# Patient Record
Sex: Female | Born: 1975 | Hispanic: No | Smoking: Former smoker
Health system: Southern US, Community
[De-identification: ages and names within clinical notes are randomized; demographics above are authoritative.]

## PROBLEM LIST (undated history)

## (undated) DIAGNOSIS — T7840XA Allergy, unspecified, initial encounter: Secondary | ICD-10-CM

## (undated) DIAGNOSIS — C801 Malignant (primary) neoplasm, unspecified: Secondary | ICD-10-CM

## (undated) DIAGNOSIS — Z9889 Other specified postprocedural states: Secondary | ICD-10-CM

## (undated) DIAGNOSIS — I8393 Asymptomatic varicose veins of bilateral lower extremities: Secondary | ICD-10-CM

## (undated) DIAGNOSIS — T8859XA Other complications of anesthesia, initial encounter: Secondary | ICD-10-CM

## (undated) DIAGNOSIS — K219 Gastro-esophageal reflux disease without esophagitis: Secondary | ICD-10-CM

## (undated) DIAGNOSIS — R112 Nausea with vomiting, unspecified: Secondary | ICD-10-CM

## (undated) HISTORY — DX: Asymptomatic varicose veins of bilateral lower extremities: I83.93

## (undated) HISTORY — DX: Malignant (primary) neoplasm, unspecified: C80.1

## (undated) HISTORY — DX: Allergy, unspecified, initial encounter: T78.40XA

## (undated) HISTORY — DX: Gastro-esophageal reflux disease without esophagitis: K21.9

## (undated) HISTORY — PX: OTHER SURGICAL HISTORY: SHX169

---

## 2001-12-14 DIAGNOSIS — C801 Malignant (primary) neoplasm, unspecified: Secondary | ICD-10-CM

## 2001-12-14 HISTORY — DX: Malignant (primary) neoplasm, unspecified: C80.1

## 2010-12-14 HISTORY — PX: SHOULDER SURGERY: SHX246

## 2010-12-14 HISTORY — PX: TONSILLECTOMY: SUR1361

## 2014-12-14 LAB — HM PAP SMEAR: HM PAP: NORMAL

## 2016-11-23 ENCOUNTER — Ambulatory Visit (INDEPENDENT_AMBULATORY_CARE_PROVIDER_SITE_OTHER): Payer: 59 | Admitting: Medical

## 2016-11-23 ENCOUNTER — Encounter: Payer: Self-pay | Admitting: Medical

## 2016-11-23 ENCOUNTER — Telehealth: Payer: Self-pay

## 2016-11-23 VITALS — BP 90/62 | HR 74 | Temp 98.2°F | Ht 62.75 in | Wt 139.0 lb

## 2016-11-23 DIAGNOSIS — J0110 Acute frontal sinusitis, unspecified: Secondary | ICD-10-CM | POA: Diagnosis not present

## 2016-11-23 DIAGNOSIS — H6690 Otitis media, unspecified, unspecified ear: Secondary | ICD-10-CM | POA: Diagnosis not present

## 2016-11-23 DIAGNOSIS — R059 Cough, unspecified: Secondary | ICD-10-CM

## 2016-11-23 DIAGNOSIS — R05 Cough: Secondary | ICD-10-CM

## 2016-11-23 MED ORDER — BENZONATATE 100 MG PO CAPS: 100.0000 mg | ORAL_CAPSULE | Freq: Three times a day (TID) | ORAL | 0 refills | Status: DC | PRN

## 2016-11-23 MED ORDER — AMOXICILLIN-POT CLAVULANATE 875-125 MG PO TABS: 1.0000 | ORAL_TABLET | Freq: Two times a day (BID) | ORAL | 0 refills | Status: DC

## 2016-11-23 MED ORDER — MOMETASONE FUROATE 50 MCG/ACT NA SUSP: 2.0000 | Freq: Every day | NASAL | 6 refills | Status: DC

## 2016-11-23 MED ORDER — AZELASTINE HCL 0.1 % NA SOLN: 2.0000 | Freq: Two times a day (BID) | NASAL | 3 refills | Status: DC

## 2016-11-23 MED FILL — BENZONATATE 100 MG CAPSULE: 100 | 7 days supply | Qty: 21 | Fill #0

## 2016-11-23 MED FILL — AZELASTINE 0.1% (137 MCG) S: 0.1 | 25 days supply | Qty: 30 | Fill #0

## 2016-11-23 MED FILL — AMOX-CLAV 875-125 MG TABLET: 875-125 | 10 days supply | Qty: 20 | Fill #0

## 2016-11-23 NOTE — Patient Instructions (Addendum)
Your appear to have a sinus infection. I am prescribing augmentin antibiotic for the infection. To help with the nasal congestion I prescribed nasonex and astelin. For your associated cough, I prescribed cough medicine benzonatate.  Rest, hydrate, tylenol for fever.  Follow up in 7 days or as needed.

## 2016-11-23 NOTE — Telephone Encounter (Signed)
PA initiated via Covermymeds; KEY: [id number]. Awaiting determination.

## 2016-11-23 NOTE — Progress Notes (Signed)
Subjective:    Patient ID: Lindsey Macias, female    DOB: 1976-01-15, 40 y.o.   MRN: [id number]  HPI  I have reviewed pt PMH, PSH, FH, Social History and Surgical History  Pt is new to area. [place] from [place]. Most recently was in [place]. She was in [employer]. Pt was in [employer] for 4 years. 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 Pt works for [employer], Pt does exercise regularly 3 times.  Pt in with some recent illness of 3 rounds of nasal congestion, pnd cough and sinus pressure. This has 3 events of this since September. Pt gets some sneezing mixed in as well. Pt stats has some allergies to dust mites. Pt living with friends who have carpet. Also house she is staying 95 degrees.  Occasionally smoker. About one pack a month.  LMP- pt states been on depo provera in the past. Last one was in 2010. Pt has been on mirena.    Review of Systems  Constitutional: Negative for chills, fatigue and fever.  HENT: Positive for congestion, postnasal drip, sinus pain and sinus pressure. Negative for sneezing.   Respiratory: Negative for cough, chest tightness, shortness of breath and wheezing.        Some prodcutive cough.  Cardiovascular: Negative for chest pain and palpitations.  Gastrointestinal: Negative for abdominal pain.  Musculoskeletal: Negative for back pain.  Skin: Negative for rash.  Hematological: Negative for adenopathy. Does not bruise/bleed easily.  Psychiatric/Behavioral: Negative for behavioral problems and confusion.    Past Medical History:  Diagnosis Date  . Allergy   . Cancer [facility]) 2003     Social History   Social History  . Marital status: Divorced    Spouse name: N/A  . Number of children: N/A  . Years of education: N/A   Occupational History  . Not on file.   Social History Main Topics  . Smoking status: Current Some Day Smoker    Packs/day: 0.00    Types: Cigarettes    Start date: 11/23/1994  . Smokeless tobacco: Never  Used  . Alcohol use Yes     Comment: Pt states she drinks 2-3 drinks per year.  . Drug use: No  . Sexual activity: Not on file   Other Topics Concern  . Not on file   Social History Narrative  . No narrative on file    Past Surgical History:  Procedure Laterality Date  . TONSILLECTOMY Bilateral 2012    Family History  Problem Relation Age of Onset  . Cancer Father     Brain    Allergies  Allergen Reactions  . Vicodin [Hydrocodone-Acetaminophen] Photosensitivity  . Dust Mite Extract     Sneezing,Watery eyes,Runny nose  . Sulfa Antibiotics     Upset stomach    No current outpatient prescriptions on file prior to visit.   No current facility-administered medications on file prior to visit.     BP 90/62 (BP Location: Left Arm, Patient Position: Sitting, Cuff Size: Normal)   Pulse 74   Temp 98.2 F (36.8 C) (Oral)   Ht 5' 2.75" (1.594 m)   Wt 139 lb (63 kg)   SpO2 99%   BMI 24.82 kg/m       Objective:   Physical Exam  General  Mental Status - Alert. General Appearance - Well groomed. Not in acute distress.  Skin Rashes- No Rashes.  HEENT Head- Normal. Ear Auditory Canal - Left- Normal. Right - Normal.Tympanic Membrane- Left- Normal. Right- tm  mild dull Eye Sclera/Conjunctiva- Left- Normal. Right- Normal. Nose & Sinuses Nasal Mucosa- Left-  Boggy and Congested. Right-  Boggy and  Congested.Bilateral maxillary and frontal sinus pressure. Mouth & Throat Lips: Upper Lip- Normal: no dryness, cracking, pallor, cyanosis, or vesicular eruption. Lower Lip-Normal: no dryness, cracking, pallor, cyanosis or vesicular eruption. Buccal Mucosa- Bilateral- No Aphthous ulcers. Oropharynx- No Discharge or Erythema. Tonsils: Characteristics- Bilateral- No Erythema or Congestion. Size/Enlargement- Bilateral- No enlargement. Discharge- bilateral-None.  Neck Neck- Supple. No Masses.   Chest and Lung Exam Auscultation: Breath Sounds:-Clear even and  unlabored.  Cardiovascular Auscultation:Rythm- Regular, rate and rhythm. Murmurs & Other Heart Sounds:Ausculatation of the heart reveal- No Murmurs.  Lymphatic Head & Neck General Head & Neck Lymphatics: Bilateral: Description- No Localized lymphadenopathy.       Assessment & Plan:  Your appear to have a sinus infection. I am prescribing augmentin  antibiotic for the infection. To help with the nasal congestion I prescribed nasonex and astelin. For your associated cough, I prescribed cough medicine benzonatate.  Rest, hydrate, tylenol for fever.  Follow up in 7 days or as needed.  Pt has hx of using nasonex 2 sprays each nares twice daily. She had used such a manner in [employer] Rx by allergist. She will get second one otc and continue to use as such. She will see if she can self refer to another allergist. I explained to her if I write her this way I don't think insurance will authorize the second script.  Rosealie Reach, Percell Miller, PA-C

## 2016-11-26 ENCOUNTER — Ambulatory Visit (INDEPENDENT_AMBULATORY_CARE_PROVIDER_SITE_OTHER): Payer: 59 | Admitting: Medical

## 2016-11-26 ENCOUNTER — Encounter: Payer: Self-pay | Admitting: Medical

## 2016-11-26 VITALS — BP 108/65 | HR 85 | Temp 98.0°F | Ht 62.75 in | Wt 140.0 lb

## 2016-11-26 DIAGNOSIS — J309 Allergic rhinitis, unspecified: Secondary | ICD-10-CM | POA: Diagnosis not present

## 2016-11-26 DIAGNOSIS — H6693 Otitis media, unspecified, bilateral: Secondary | ICD-10-CM | POA: Diagnosis not present

## 2016-11-26 DIAGNOSIS — R05 Cough: Secondary | ICD-10-CM | POA: Diagnosis not present

## 2016-11-26 DIAGNOSIS — J0100 Acute maxillary sinusitis, unspecified: Secondary | ICD-10-CM | POA: Diagnosis not present

## 2016-11-26 DIAGNOSIS — R059 Cough, unspecified: Secondary | ICD-10-CM

## 2016-11-26 MED ORDER — METHYLPREDNISOLONE ACETATE 80 MG/ML IJ SUSP
80.0000 mg | Freq: Once | INTRAMUSCULAR | Status: AC
  Administered 2016-11-26: 80 mg via INTRAMUSCULAR

## 2016-11-26 MED ORDER — GUAIFENESIN-CODEINE 100-10 MG/5ML PO SOLN: 5.0000 mL | Freq: Four times a day (QID) | ORAL | 0 refills | Status: DC | PRN

## 2016-11-26 MED ORDER — CEFTRIAXONE SODIUM 1 G IJ SOLR
1.0000 g | Freq: Once | INTRAMUSCULAR | Status: AC
  Administered 2016-11-26: 1 g via INTRAMUSCULAR

## 2016-11-26 NOTE — Progress Notes (Signed)
Pre visit review using our clinic tool,if applicable. No additional management support is needed unless otherwise documented below in the visit note.  

## 2016-11-26 NOTE — Patient Instructions (Addendum)
For persisting sinus infection and now bilateral early OM we gave you rocephin 1 gram. Continue the augmentin.   Your nasal congestion is moderate to severe and nasonex was not covered by your insurance. Decided to give you depomedrol 80 mg to aggressively treat your allergies and open you up.  For cough stop benzonatate and start robitussin AC.  Continue astelin  pending our prior authorization attempt for your nasonex  Follow up in 7 days or as needed

## 2016-11-26 NOTE — Progress Notes (Signed)
Subjective:    Patient ID: Lindsey Macias, female    DOB: Apr 12, 1976, 40 y.o.   MRN: [id number]  HPI   Pt in for follow up for persisting ear pain and pressure. Pt had mostly sinus pain and I rx'd augmentin, astelin, nasonex, and benzonatate. Her insurance would not cover nasonex. Coughing despite use of benzonate. No wheezing. Pt states now has ear pressure. Sinus pressure is about the same.   No fevers, no chills, no sweats and no wheezing.   Review of Systems  Constitutional: Negative for chills, fatigue and fever.  HENT: Positive for congestion, ear pain, postnasal drip, sinus pain and sinus pressure. Negative for sneezing and tinnitus.   Respiratory: Positive for cough. Negative for chest tightness, shortness of breath and wheezing.   Cardiovascular: Negative for chest pain and palpitations.  Gastrointestinal: Negative for abdominal pain.  Musculoskeletal: Negative for back pain.  Neurological: Negative for dizziness and headaches.  Hematological: Negative for adenopathy. Does not bruise/bleed easily.  Psychiatric/Behavioral: Negative for behavioral problems and confusion.   Past Medical History:  Diagnosis Date  . Allergy   . Cancer [facility]) 2003   cervical cancer 2002 and 2003. had leep procedure.  Marland Kitchen GERD (gastroesophageal reflux disease)    but actually had h pylori and took meds then got better.     Social History   Social History  . Marital status: Divorced    Spouse name: N/A  . Number of children: N/A  . Years of education: N/A   Occupational History  . Not on file.   Social History Main Topics  . Smoking status: Current Some Day Smoker    Packs/day: 0.00    Types: Cigarettes    Start date: 11/23/1994  . Smokeless tobacco: Never Used  . Alcohol use Yes     Comment: Pt states she drinks 2-3 drinks per year.  . Drug use: No  . Sexual activity: Not on file   Other Topics Concern  . Not on file   Social History Narrative  . No narrative on file     Past Surgical History:  Procedure Laterality Date  . Bunions surgery Right 2010 and 2012  . SHOULDER SURGERY Right 2012  . TONSILLECTOMY Bilateral 2012    Family History  Problem Relation Age of Onset  . Cancer Father     Brain    Allergies  Allergen Reactions  . Vicodin [Hydrocodone-Acetaminophen] Photosensitivity  . Dust Mite Extract     Sneezing,Watery eyes,Runny nose  . Sulfa Antibiotics     Upset stomach    Current Outpatient Prescriptions on File Prior to Visit  Medication Sig Dispense Refill  . amoxicillin-clavulanate (AUGMENTIN) 875-125 MG tablet Take 1 tablet by mouth 2 (two) times daily. 20 tablet 0  . azelastine (ASTELIN) 0.1 % nasal spray Place 2 sprays into both nostrils 2 (two) times daily. Use in each nostril as directed    . azelastine (ASTELIN) 0.1 % nasal spray Place 2 sprays into both nostrils 2 (two) times daily. Use in each nostril as directed 30 mL 3  . azelastine (OPTIVAR) 0.05 % ophthalmic solution Place 2 drops into both eyes 2 (two) times daily.    . benzonatate (TESSALON) 100 MG capsule Take 1 capsule (100 mg total) by mouth 3 (three) times daily as needed for cough. 21 capsule 0  . mometasone (NASONEX) 50 MCG/ACT nasal spray Place 2 sprays into the nose daily. 17 g 6   No current facility-administered medications on file prior to  visit.     BP 108/65   Pulse 85   Temp 98 F (36.7 C) (Oral)   Ht 5' 2.75" (1.594 m)   Wt 140 lb (63.5 kg)   SpO2 100%   BMI 25.00 kg/m       Objective:   Physical Exam   General  Mental Status - Alert. General Appearance - Well groomed. Not in acute distress.moderate to severe nasal congestion.(overall sounds worse and expresses need to get better quickly)  Skin Rashes- No Rashes.  HEENT Head- Normal. Ear Auditory Canal - Left- Normal. Right - Normal.Tympanic Membrane- Left- reddish pink now. Right- reddish pink now. Eye Sclera/Conjunctiva- Left- Normal. Right- Normal. Nose & Sinuses Nasal  Mucosa- Left-  Boggy and Congested. Right-  Boggy and  Congested.Bilateral maxillary and frontal sinus pressure.(ethmoid as well) Mouth & Throat Lips: Upper Lip- Normal: no dryness, cracking, pallor, cyanosis, or vesicular eruption. Lower Lip-Normal: no dryness, cracking, pallor, cyanosis or vesicular eruption. Buccal Mucosa- Bilateral- No Aphthous ulcers. Oropharynx- No Discharge or Erythema.(+pnd) Tonsils: Characteristics- Bilateral- No Erythema or Congestion. Size/Enlargement- Bilateral- No enlargement. Discharge- bilateral-None.  Neck Neck- Supple. No Masses.   Chest and Lung Exam Auscultation: Breath Sounds:-Clear even and unlabored.  Cardiovascular Auscultation:Rythm- Regular, rate and rhythm. Murmurs & Other Heart Sounds:Ausculatation of the heart reveal- No Murmurs.  Lymphatic Head & Neck General Head & Neck Lymphatics: Bilateral: Description- No Localized lymphadenopathy.      Assessment & Plan:  For persisting sinus infection and now bilateral early OM we gave you rocephin 1 gram. Continue the augmentin.   Your nasal congestion is moderate to severe and nasonex was not covered by your insurance. Decided to give you depomedrol 80 mg to aggressivley treat your allergies and open you up.  For cough stop benzonatate and start robitussin AC.  Continue astelin and pending our prior authorization attempt for your nasonex  Follow up in 7 days or as needed

## 2016-11-27 MED FILL — GUAIATUSSIN AC LIQUID: 100-10 | 5 days supply | Qty: 120 | Fill #0

## 2016-12-10 ENCOUNTER — Telehealth: Payer: Self-pay | Admitting: Medical

## 2016-12-10 NOTE — Telephone Encounter (Signed)
Will you notify pt that nasonex  prior authorization was declined. So would you ask her if she has used combination of flonase otc and astelin. This might be somewhat equivalent.   I could rx flonase. Some insurance will pay for flonase? Does she want to try that?

## 2016-12-10 NOTE — Telephone Encounter (Signed)
Spoke w/ [employer] at [phone] to check on PA status. PA denied, Nasonex not on Pt's formulary. PA denial sent for scanning.

## 2016-12-11 NOTE — Telephone Encounter (Signed)
Called patient. She says flonase does not work for her and she's currently on astelin. She added that she typically has to take a combination nasal spray.  States momethasone is what seems to work best for her.  She was advised to call insurance company to see which medications they will cover.  Pt states she will and that she also plans to switch to new insurance soon and will have her allergist submit records to new insurance company to see if insurance will cover nasonex then.

## 2017-02-17 ENCOUNTER — Ambulatory Visit (HOSPITAL_BASED_OUTPATIENT_CLINIC_OR_DEPARTMENT_OTHER)
Admission: RE | Admit: 2017-02-17 | Discharge: 2017-02-17 | Disposition: A | Discharge status: Home / Self Care | Payer: BLUE CROSS/BLUE SHIELD | Source: Ambulatory Visit | Attending: Medical | Admitting: Medical

## 2017-02-17 ENCOUNTER — Encounter: Payer: Self-pay | Admitting: Medical

## 2017-02-17 ENCOUNTER — Ambulatory Visit (INDEPENDENT_AMBULATORY_CARE_PROVIDER_SITE_OTHER): Payer: BLUE CROSS/BLUE SHIELD | Admitting: Medical

## 2017-02-17 VITALS — BP 118/83 | HR 83 | Temp 97.5°F | Ht 62.75 in | Wt 144.4 lb

## 2017-02-17 DIAGNOSIS — R509 Fever, unspecified: Secondary | ICD-10-CM

## 2017-02-17 DIAGNOSIS — R0981 Nasal congestion: Secondary | ICD-10-CM

## 2017-02-17 DIAGNOSIS — R05 Cough: Secondary | ICD-10-CM

## 2017-02-17 DIAGNOSIS — F17200 Nicotine dependence, unspecified, uncomplicated: Secondary | ICD-10-CM | POA: Diagnosis not present

## 2017-02-17 DIAGNOSIS — R918 Other nonspecific abnormal finding of lung field: Secondary | ICD-10-CM | POA: Diagnosis not present

## 2017-02-17 DIAGNOSIS — J029 Acute pharyngitis, unspecified: Secondary | ICD-10-CM | POA: Diagnosis not present

## 2017-02-17 DIAGNOSIS — R059 Cough, unspecified: Secondary | ICD-10-CM

## 2017-02-17 DIAGNOSIS — H6690 Otitis media, unspecified, unspecified ear: Secondary | ICD-10-CM | POA: Diagnosis not present

## 2017-02-17 DIAGNOSIS — R6889 Other general symptoms and signs: Secondary | ICD-10-CM | POA: Diagnosis not present

## 2017-02-17 LAB — POCT RAPID STREP A (OFFICE): Rapid Strep A Screen: NEGATIVE

## 2017-02-17 LAB — POC INFLUENZA A&B (BINAX/QUICKVUE)
INFLUENZA B, POC: NEGATIVE
Influenza A, POC: NEGATIVE

## 2017-02-17 MED ORDER — BENZONATATE 200 MG PO CAPS: 200.0000 mg | ORAL_CAPSULE | Freq: Two times a day (BID) | ORAL | 0 refills | Status: DC | PRN

## 2017-02-17 MED ORDER — AZELASTINE HCL 0.1 % NA SOLN: 2.0000 | Freq: Two times a day (BID) | NASAL | 3 refills | Status: DC

## 2017-02-17 MED ORDER — AZITHROMYCIN 250 MG PO TABS: ORAL_TABLET | ORAL | 0 refills | Status: DC

## 2017-02-17 MED ORDER — MOMETASONE FUROATE 50 MCG/ACT NA SUSP: 2.0000 | Freq: Every day | NASAL | 6 refills | Status: DC

## 2017-02-17 MED ORDER — METHYLPREDNISOLONE ACETATE 40 MG/ML IJ SUSP
40.0000 mg | Freq: Once | INTRAMUSCULAR | Status: AC
  Administered 2017-02-17: 40 mg via INTRAMUSCULAR

## 2017-02-17 MED FILL — AZELASTINE 0.1% (137 MCG) S: 0.1 | 30 days supply | Qty: 30 | Fill #0

## 2017-02-17 MED FILL — MOMETASONE FUROATE 50 MCG S: 50 | 30 days supply | Qty: 17 | Fill #0

## 2017-02-17 MED FILL — AZITHROMYCIN 250 MG TABLET: 250 | 5 days supply | Qty: 6 | Fill #0

## 2017-02-17 MED FILL — BENZONATATE 200 MG CAP: 200 | 10 days supply | Qty: 20 | Fill #0

## 2017-02-17 NOTE — Progress Notes (Signed)
Subjective:    Patient ID: Lindsey Macias, female    DOB: Nov 28, 1976, 41 y.o.   MRN: [id number]  HPI  Pt in with st x 4 days. Pain is moderate now. Worse the other days. She has some nasal congestion but ear pressure. Pt states feels tired. 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 Many are on antibiotics. Her immediate coworker who shares office has bronchitis.  No diffuse bodyaches. But severe fatigue.  Pt using flonase and astelin.  LMP- mirena.  Review of Systems  Constitutional: Positive for chills, fatigue and fever.  HENT: Positive for congestion, ear pain, sinus pain and sinus pressure. Negative for hearing loss.   Respiratory: Positive for cough and wheezing. Negative for chest tightness and shortness of breath.        Maybe transient wheeze.  Cardiovascular: Negative for chest pain and palpitations.  Gastrointestinal: Negative for abdominal pain.  Musculoskeletal: Negative for back pain.  Skin: Negative for rash.  Neurological: Negative for dizziness and headaches.  Hematological: Negative for adenopathy. Does not bruise/bleed easily.    Past Medical History:  Diagnosis Date  . Allergy   . Cancer [facility]) 2003   cervical cancer 2002 and 2003. had leep procedure.  Marland Kitchen GERD (gastroesophageal reflux disease)    but actually had h pylori and took meds then got better.     Social History   Social History  . Marital status: Divorced    Spouse name: N/A  . Number of children: N/A  . Years of education: N/A   Occupational History  . Not on file.   Social History Main Topics  . Smoking status: Current Some Day Smoker    Packs/day: 0.00    Types: Cigarettes    Start date: 11/23/1994  . Smokeless tobacco: Never Used  . Alcohol use Yes     Comment: Pt states she drinks 2-3 drinks per year.  . Drug use: No  . Sexual activity: Not on file   Other Topics Concern  . Not on file   Social History Narrative  . No narrative on file    Past Surgical  History:  Procedure Laterality Date  . Bunions surgery Right 2010 and 2012  . SHOULDER SURGERY Right 2012  . TONSILLECTOMY Bilateral 2012    Family History  Problem Relation Age of Onset  . Cancer Father     Brain    Allergies  Allergen Reactions  . Vicodin [Hydrocodone-Acetaminophen] Photosensitivity  . Dust Mite Extract     Sneezing,Watery eyes,Runny nose  . Sulfa Antibiotics     Upset stomach    Current Outpatient Prescriptions on File Prior to Visit  Medication Sig Dispense Refill  . azelastine (ASTELIN) 0.1 % nasal spray Place 2 sprays into both nostrils 2 (two) times daily. Use in each nostril as directed    . azelastine (ASTELIN) 0.1 % nasal spray Place 2 sprays into both nostrils 2 (two) times daily. Use in each nostril as directed 30 mL 3  . azelastine (OPTIVAR) 0.05 % ophthalmic solution Place 2 drops into both eyes 2 (two) times daily.    . benzonatate (TESSALON) 100 MG capsule Take 1 capsule (100 mg total) by mouth 3 (three) times daily as needed for cough. 21 capsule 0  . guaiFENesin-codeine 100-10 MG/5ML syrup Take 5 mLs by mouth every 6 (six) hours as needed for cough. 120 mL 0  . mometasone (NASONEX) 50 MCG/ACT nasal spray Place 2 sprays into the nose daily. 17 g  6   No current facility-administered medications on file prior to visit.     BP 118/83 (BP Location: Left Arm, Patient Position: Sitting, Cuff Size: Normal)   Pulse 83   Temp 97.5 F (36.4 C) (Oral)   Ht 5' 2.75" (1.594 m)   Wt 144 lb 6.4 oz (65.5 kg)   SpO2 100% Comment: RA  BMI 25.78 kg/m       Objective:   Physical Exam  General  Mental Status - Alert. General Appearance - Well groomed. Not in acute distress.  Skin Rashes- No Rashes.  HEENT Head- Normal. Ear Auditory Canal - Left- Normal. Right - Normal.Tympanic Membrane- Left- moderate red Right- mild red Eye Sclera/Conjunctiva- Left- Normal. Right- Normal. Nose & Sinuses Nasal Mucosa- Left-  Boggy and Congested. Right-  Boggy  and  Congested.Bilateral maxillary and frontal sinus pressure. Mouth & Throat Lips: Upper Lip- Normal: no dryness, cracking, pallor, cyanosis, or vesicular eruption. Lower Lip-Normal: no dryness, cracking, pallor, cyanosis or vesicular eruption. Buccal Mucosa- Bilateral- No Aphthous ulcers. Oropharynx- No Discharge or Erythema. Tonsils: Characteristics- Bilateral- No Erythema or Congestion. Size/Enlargement- Bilateral- No enlargement. Discharge- bilateral-None.  Neck Neck- Supple. No Masses.   Chest and Lung Exam Auscultation: Breath Sounds:-even and unlabored. Mild rough breath sounds left lower lobe.  Cardiovascular Auscultation:Rythm- Regular, rate and rhythm. Murmurs & Other Heart Sounds:Ausculatation of the heart reveal- No Murmurs.  Lymphatic Head & Neck General Head & Neck Lymphatics: Bilateral: Description- No Localized lymphadenopathy.       Assessment & Plan:  For your nasal congestion will rx nasonex and refill your astelin. (since congestion severe and persists despite the sprays will add depomedrol 40 mg im)  Will get cxr today to see if pneumonia present.  Early OM present.   For possible pneumonia and early ear infections rx azithromycin antibiotic.  For cough rx benzonatate  Rest hydrate and tylenol for fever.   Follow up in 7 days or as needed  Para Cossey, Percell Miller, [employer]

## 2017-02-17 NOTE — Addendum Note (Signed)
Addended by: Rockwell Germany on: 02/17/2017 12:34 PM   Modules accepted: Orders

## 2017-02-17 NOTE — Patient Instructions (Addendum)
For your nasal congestion will rx nasonex and refill your astelin.(since congestion severe and persists despite the sprays will add depomedrol 40 mg im)  Will get cxr today to see if pneumonia present.  Early OM present.   For possible pneumonia and early ear infections rx azithromycin antibiotic.  For cough rx benzonatate  Rest hydrate and tylenol for fever.   Follow up in 7 days or as needed

## 2017-02-17 NOTE — Progress Notes (Signed)
Pre visit review using our clinic review tool, if applicable. No additional management support is needed unless otherwise documented below in the visit note. 

## 2017-05-17 ENCOUNTER — Encounter: Payer: Self-pay | Admitting: Medical

## 2017-05-17 ENCOUNTER — Ambulatory Visit (INDEPENDENT_AMBULATORY_CARE_PROVIDER_SITE_OTHER): Payer: BLUE CROSS/BLUE SHIELD | Admitting: Medical

## 2017-05-17 VITALS — BP 112/76 | HR 80 | Temp 98.6°F | Resp 16 | Ht 62.0 in | Wt 141.4 lb

## 2017-05-17 DIAGNOSIS — K649 Unspecified hemorrhoids: Secondary | ICD-10-CM

## 2017-05-17 DIAGNOSIS — R109 Unspecified abdominal pain: Secondary | ICD-10-CM

## 2017-05-17 DIAGNOSIS — K921 Melena: Secondary | ICD-10-CM | POA: Diagnosis not present

## 2017-05-17 DIAGNOSIS — R197 Diarrhea, unspecified: Secondary | ICD-10-CM

## 2017-05-17 MED ORDER — HYDROCORTISONE ACETATE 25 MG RE SUPP: 25.0000 mg | Freq: Two times a day (BID) | RECTAL | 0 refills | Status: DC

## 2017-05-17 NOTE — Patient Instructions (Addendum)
Your abdomen pain level recently and diarrhea appears not consistent with IBS. I want to get labs today to assess infection fighting cells(as well as other labs). CT abd/pevis tomorrow at 12:30.  Turn in stool panel studies tomorrow as well.  Hydrate well and eat bland diet. After imaging studies done may add cipro and flagyl. Turning in stool studies important in evaluating you fully.   If your pain worsens prior to imaging tomorrow then ED evaluation. For diarrhea use imodium.  rx annusol hc for your hemorrhoid hx.  Follow up in 7 days or as needed

## 2017-05-17 NOTE — Progress Notes (Signed)
Subjective:    Patient ID: Lindsey Macias, female    DOB: 18-Jan-1976, 41 y.o.   MRN: [id number]  HPI  Pt in for evaluation. Pt states recent hemorrhoid flared up . She was told in past had internal hemorrhoids. But goes on to describe below sever abdomen pain that made her consider going to ED.  Pt also ibs as well in past. She states Saturday has loose stools(fairly high number estimation 8-10). On and off all day. She had some bright red blood in her stools. Pt in past used levsin and it helps little bit. It did help her cramping just a little bit.   Pt states pain on Saturday was severe. Level 9/10. Pt states this was not ibs like pain.   LMP- pt is on mirena.  Pt states some rectal area discomfort. But no severe itching or burning. Also knows no lump in area.   In past pt had colonosocopy in [place]. She states had 3 polyps in 2014 or 2015.(Dr. Blackard [phone].)   Review of Systems  Constitutional: Positive for chills, fatigue and fever.  Respiratory: Negative for cough, choking, shortness of breath and wheezing.   Cardiovascular: Negative for chest pain and palpitations.  Gastrointestinal: Positive for diarrhea. Negative for abdominal pain, blood in stool, nausea and vomiting.       Hematochezia recently when had diarrhea.  Musculoskeletal: Negative for back pain and joint swelling.  Hematological: Negative for adenopathy. Does not bruise/bleed easily.  Psychiatric/Behavioral: Negative for behavioral problems, confusion, dysphoric mood, self-injury and suicidal ideas. The patient is not hyperactive.     Past Medical History:  Diagnosis Date  . Allergy   . Cancer [facility]) 2003   cervical cancer 2002 and 2003. had leep procedure.  Marland Kitchen GERD (gastroesophageal reflux disease)    but actually had h pylori and took meds then got better.     Social History   Social History  . Marital status: Divorced    Spouse name: N/A  . Number of children: N/A  . Years of education:  N/A   Occupational History  . Not on file.   Social History Main Topics  . Smoking status: Current Some Day Smoker    Packs/day: 0.00    Types: Cigarettes    Start date: 11/23/1994  . Smokeless tobacco: Never Used  . Alcohol use Yes     Comment: Pt states she drinks 2-3 drinks per year.  . Drug use: No  . Sexual activity: Not on file   Other Topics Concern  . Not on file   Social History Narrative  . No narrative on file    Past Surgical History:  Procedure Laterality Date  . Bunions surgery Right 2010 and 2012  . SHOULDER SURGERY Right 2012  . TONSILLECTOMY Bilateral 2012    Family History  Problem Relation Age of Onset  . Cancer Father        Brain    Allergies  Allergen Reactions  . Vicodin [Hydrocodone-Acetaminophen] Photosensitivity  . Dust Mite Extract     Sneezing,Watery eyes,Runny nose  . Sulfa Antibiotics     Upset stomach    Current Outpatient Prescriptions on File Prior to Visit  Medication Sig Dispense Refill  . azelastine (ASTELIN) 0.1 % nasal spray Place 2 sprays into both nostrils 2 (two) times daily. Use in each nostril as directed 30 mL 3  . azelastine (ASTELIN) 0.1 % nasal spray Place 2 sprays into both nostrils 2 (two) times daily. Use in each  nostril as directed 30 mL 3  . azelastine (OPTIVAR) 0.05 % ophthalmic solution Place 2 drops into both eyes 2 (two) times daily.    Marland Kitchen azithromycin (ZITHROMAX) 250 MG tablet Take 2 tablets by mouth on day 1, followed by 1 tablet by mouth daily for 4 days. 6 tablet 0  . benzonatate (TESSALON) 200 MG capsule Take 1 capsule (200 mg total) by mouth 2 (two) times daily as needed for cough. 20 capsule 0  . guaiFENesin-codeine 100-10 MG/5ML syrup Take 5 mLs by mouth every 6 (six) hours as needed for cough. 120 mL 0  . mometasone (NASONEX) 50 MCG/ACT nasal spray Place 2 sprays into the nose daily. 17 g 6   No current facility-administered medications on file prior to visit.     BP 112/76 (BP Location: Left  Arm, Patient Position: Sitting, Cuff Size: Normal)   Pulse 80   Temp 98.6 F (37 C) (Oral)   Resp 16   Ht 5' 2"  (1.575 m)   Wt 141 lb 6.4 oz (64.1 kg)   SpO2 99%   BMI 25.86 kg/m       Objective:   Physical Exam  General Appearance- Not in acute distress.  HEENT Eyes- Scleraeral/Conjuntiva-bilat- Not Yellow. Mouth & Throat- Normal.  Chest and Lung Exam Auscultation: Breath sounds:-Normal. Adventitious sounds:- No Adventitious sounds.  Cardiovascular Auscultation:Rythm - Regular. Heart Sounds -Normal heart sounds.  Abdomen Inspection:-Inspection Normal.  Palpation/Perucssion: Palpation and Percussion of the abdomen reveal- faint lower abdomen  Tender and epigastric, No Rebound tenderness, No rigidity(Guarding) and No Palpable abdominal masses.  Liver:-Normal.  Spleen:- Normal.   Rectal Declined by pt after discussion.      Assessment & Plan:  Your abdomen pain and diarrhea appears not consistent with IBS. I want to get labs today to assess infection fighting cells(as well as other labs). CT abd/pevis tomorrow at 12:30.  Turn in stool panel studies tomorrow.  Hydrate well and eat bland diet. After imaging studies done may add cipro and flagyl. Turning in stool studies important in evaluating you fully.   If your pain worsens prior to imaging tomorrow then ED evaluation. For diarrhea use imodium.  rx annusol hc for your hemorrhoid hx.  Follow up in 7 days or as needed  Pt explained she is only person at work today and payroll needs to get done. So she asked that imaging studies get done tomorrow. But asked/instructed get lab/blood work today.  Selby Foisy, Percell Miller, PA-C

## 2017-05-18 ENCOUNTER — Ambulatory Visit (HOSPITAL_BASED_OUTPATIENT_CLINIC_OR_DEPARTMENT_OTHER)
Admission: RE | Admit: 2017-05-18 | Discharge: 2017-05-18 | Disposition: A | Discharge status: Home / Self Care | Payer: BLUE CROSS/BLUE SHIELD | Source: Ambulatory Visit | Attending: Medical | Admitting: Medical

## 2017-05-18 DIAGNOSIS — N83201 Unspecified ovarian cyst, right side: Secondary | ICD-10-CM | POA: Insufficient documentation

## 2017-05-18 DIAGNOSIS — R109 Unspecified abdominal pain: Secondary | ICD-10-CM

## 2017-05-18 DIAGNOSIS — R102 Pelvic and perineal pain: Secondary | ICD-10-CM | POA: Insufficient documentation

## 2017-05-18 LAB — CBC WITH DIFFERENTIAL/PLATELET
BASOS PCT: 0.6 % (ref 0.0–3.0)
Basophils Absolute: 0.1 10*3/uL (ref 0.0–0.1)
EOS PCT: 0.7 % (ref 0.0–5.0)
Eosinophils Absolute: 0.1 10*3/uL (ref 0.0–0.7)
HCT: 46.4 % — ABNORMAL HIGH (ref 36.0–46.0)
Hemoglobin: 15.5 g/dL — ABNORMAL HIGH (ref 12.0–15.0)
LYMPHS ABS: 2.4 10*3/uL (ref 0.7–4.0)
Lymphocytes Relative: 28.1 % (ref 12.0–46.0)
MCHC: 33.4 g/dL (ref 30.0–36.0)
MCV: 95 fl (ref 78.0–100.0)
MONO ABS: 0.5 10*3/uL (ref 0.1–1.0)
Monocytes Relative: 5.8 % (ref 3.0–12.0)
NEUTROS PCT: 64.8 % (ref 43.0–77.0)
Neutro Abs: 5.6 10*3/uL (ref 1.4–7.7)
Platelets: 255 10*3/uL (ref 150.0–400.0)
RBC: 4.89 Mil/uL (ref 3.87–5.11)
RDW: 13.3 % (ref 11.5–15.5)
WBC: 8.7 10*3/uL (ref 4.0–10.5)

## 2017-05-18 LAB — COMPREHENSIVE METABOLIC PANEL
ALT: 10 U/L (ref 0–35)
AST: 15 U/L (ref 0–37)
Albumin: 4.8 g/dL (ref 3.5–5.2)
Alkaline Phosphatase: 68 U/L (ref 39–117)
BUN: 8 mg/dL (ref 6–23)
CHLORIDE: 105 meq/L (ref 96–112)
CO2: 27 mEq/L (ref 19–32)
Calcium: 9.9 mg/dL (ref 8.4–10.5)
Creatinine, Ser: 0.94 mg/dL (ref 0.40–1.20)
GFR: 69.22 mL/min (ref >60.00)
GLUCOSE: 82 mg/dL (ref 70–99)
POTASSIUM: 4.8 meq/L (ref 3.5–5.1)
SODIUM: 139 meq/L (ref 135–145)
Total Bilirubin: 0.7 mg/dL (ref 0.2–1.2)
Total Protein: 7.4 g/dL (ref 6.0–8.3)

## 2017-05-18 MED ORDER — IOPAMIDOL (ISOVUE-300) INJECTION 61%
100.0000 mL | Freq: Once | INTRAVENOUS | Status: AC | PRN
  Administered 2017-05-18: 100 mL via INTRAVENOUS

## 2017-05-21 ENCOUNTER — Telehealth: Payer: Self-pay

## 2017-05-21 NOTE — Telephone Encounter (Signed)
Pt called to get CT results Notified pt of results pt voiced understanding. Pt states she is not having anymore pain but whatever she eats is going straight throw her. Informed pt if diarrhea gets worse give  [name] a call and schedule an appointment.

## 2017-06-07 ENCOUNTER — Ambulatory Visit (INDEPENDENT_AMBULATORY_CARE_PROVIDER_SITE_OTHER): Payer: BLUE CROSS/BLUE SHIELD | Admitting: Family Medicine

## 2017-06-07 ENCOUNTER — Encounter: Payer: Self-pay | Admitting: Family Medicine

## 2017-06-07 VITALS — BP 100/68 | HR 65 | Temp 98.7°F | Ht 62.0 in | Wt 142.8 lb

## 2017-06-07 DIAGNOSIS — W57XXXA Bitten or stung by nonvenomous insect and other nonvenomous arthropods, initial encounter: Secondary | ICD-10-CM | POA: Diagnosis not present

## 2017-06-07 DIAGNOSIS — S50862A Insect bite (nonvenomous) of left forearm, initial encounter: Secondary | ICD-10-CM

## 2017-06-07 MED ORDER — HYDROXYZINE HCL 25 MG PO TABS: 25.0000 mg | ORAL_TABLET | Freq: Three times a day (TID) | ORAL | 0 refills | Status: DC | PRN

## 2017-06-07 MED ORDER — HYDROCORTISONE 2.5 % EX CREA: TOPICAL_CREAM | CUTANEOUS | 0 refills | Status: DC

## 2017-06-07 NOTE — Patient Instructions (Addendum)
Try not to itch.  Use the hydroxyzine at night only if it makes you drowsy. Use in place of Benadryl.  Let us know if you need anything.

## 2017-06-07 NOTE — Progress Notes (Signed)
Chief Complaint  Patient presents with  . Insect Bite    on (L) forearm-on Sat    Subjective: Patient is a 41 y.o. female here for insect bite.  Happened 2 days ago, itching badly. She tried Benadryl and a topical cream with little relief. She has also been icing the area that has been helpful. Nothing is draining from area and there is no streaking redness. No fevers or sick contacts. No other part of her body is affected. [place].    ROS: Skin: as noted in HPI  Family History  Problem Relation Age of Onset  . Cancer Father        Brain   Past Medical History:  Diagnosis Date  . Allergy   . Cancer [facility]) 2003   cervical cancer 2002 and 2003. had leep procedure.  Marland Kitchen GERD (gastroesophageal reflux disease)    but actually had h pylori and took meds then got better.   Allergies  Allergen Reactions  . Vicodin [Hydrocodone-Acetaminophen] Photosensitivity  . Dust Mite Extract     Sneezing,Watery eyes,Runny nose  . Sulfa Antibiotics     Upset stomach    Current Outpatient Prescriptions:  .  azelastine (ASTELIN) 0.1 % nasal spray, Place 2 sprays into both nostrils 2 (two) times daily. Use in each nostril as directed, Disp: 30 mL, Rfl: 3 .  azelastine (OPTIVAR) 0.05 % ophthalmic solution, Place 2 drops into both eyes 2 (two) times daily., Disp: , Rfl:  .  hydrocortisone (ANUSOL-HC) 25 MG suppository, Place 1 suppository (25 mg total) rectally 2 (two) times daily., Disp: 12 suppository, Rfl: 0 .  mometasone (NASONEX) 50 MCG/ACT nasal spray, Place 2 sprays into the nose daily., Disp: 17 g, Rfl: 6 .  hydrocortisone 2.5 % cream, Apply thin layer to affected area on arm twice daily for 10-14 days., Disp: 30 g, Rfl: 0 .  hydrOXYzine (ATARAX/VISTARIL) 25 MG tablet, Take 1 tablet (25 mg total) by mouth 3 (three) times daily as needed for itching., Disp: 30 tablet, Rfl: 0  Objective: BP 100/68 (BP Location: Left Arm, Patient Position: Sitting, Cuff Size:  Normal)   Pulse 65   Temp 98.7 F (37.1 C) (Oral)   Ht 5' 2"  (1.575 m)   Wt 142 lb 12.8 oz (64.8 kg)   SpO2 99%   BMI 26.12 kg/m  General: Awake, appears stated age Lungs: No accessory muscle use Skin: 2 circular areas of erythema on medial R forearm, approx 2 cm in diameter each, no drainage, scaling, fluctuance, TTP, or streaking redness Psych: Age appropriate judgment and insight, normal affect and mood  Assessment and Plan: Bug bite, initial encounter - Plan: hydrocortisone 2.5 % cream, hydrOXYzine (ATARAX/VISTARIL) 25 MG tablet  Orders as above. No signs of infection or suggestion of Use cream twice daily for 10-14 days. Hydroxyzine for other itching. OK to cont icing. Fu prn. The patient voiced understanding and agreement to the plan.  [place], DO 06/07/17  2:23 PM

## 2017-07-16 ENCOUNTER — Ambulatory Visit (INDEPENDENT_AMBULATORY_CARE_PROVIDER_SITE_OTHER): Payer: BLUE CROSS/BLUE SHIELD | Admitting: Medical

## 2017-07-16 VITALS — BP 107/52 | HR 82 | Temp 98.1°F | Ht 63.0 in | Wt 139.6 lb

## 2017-07-16 DIAGNOSIS — L989 Disorder of the skin and subcutaneous tissue, unspecified: Secondary | ICD-10-CM | POA: Diagnosis not present

## 2017-07-16 DIAGNOSIS — J0100 Acute maxillary sinusitis, unspecified: Secondary | ICD-10-CM | POA: Diagnosis not present

## 2017-07-16 DIAGNOSIS — J3489 Other specified disorders of nose and nasal sinuses: Secondary | ICD-10-CM | POA: Diagnosis not present

## 2017-07-16 MED ORDER — AMOXICILLIN-POT CLAVULANATE 875-125 MG PO TABS: 1.0000 | ORAL_TABLET | Freq: Two times a day (BID) | ORAL | 0 refills | Status: DC

## 2017-07-16 MED FILL — AMOX-CLAV 875-125 MG TABLET: 875-125 | 10 days supply | Qty: 20 | Fill #0

## 2017-07-16 MED FILL — MOMETASONE FUROATE 50 MCG S: 50 | 30 days supply | Qty: 17 | Fill #1

## 2017-07-16 MED FILL — AZELASTINE 0.1% (137 MCG) S: 0.1 | 50 days supply | Qty: 30 | Fill #1

## 2017-07-16 NOTE — Progress Notes (Signed)
Subjective:    Patient ID: Lindsey Macias, female    DOB: 02/02/1976, 41 y.o.   MRN: [id number]  HPI  Pt in for evaluation.  Pt doing well with no acute illness. Having a good summer.  Pt has concern for a small area on her scalp cyst like bumps  that have been present for years since high school and late 20's. Recently getting larger and tender.   Pt wants these removed.    Also 5 days nasal congestion followed by sinus pain. Frontal and maxillary sinus pain. Teeth pain. Hx of sinus infections. Pt has underlying allergies.   Review of Systems  Constitutional: Negative for chills, fatigue and fever.  HENT: Positive for congestion, sinus pain and sinus pressure. Negative for ear pain, sore throat and trouble swallowing.   Respiratory: Negative for cough, chest tightness, shortness of breath and wheezing.   Cardiovascular: Negative for chest pain and palpitations.  Gastrointestinal: Negative for abdominal pain.  Skin: Negative for rash.       See hpi and exam.  Neurological: Negative for dizziness, weakness and headaches.  Hematological: Negative for adenopathy. Does not bruise/bleed easily.  Psychiatric/Behavioral: Negative for behavioral problems and confusion.     Past Medical History:  Diagnosis Date  . Allergy   . Cancer [facility]) 2003   cervical cancer 2002 and 2003. had leep procedure.  Marland Kitchen GERD (gastroesophageal reflux disease)    but actually had h pylori and took meds then got better.     Social History   Social History  . Marital status: Divorced    Spouse name: N/A  . Number of children: N/A  . Years of education: N/A   Occupational History  . Not on file.   Social History Main Topics  . Smoking status: Current Some Day Smoker    Packs/day: 0.00    Types: Cigarettes    Start date: 11/23/1994  . Smokeless tobacco: Never Used  . Alcohol use Yes     Comment: Pt states she drinks 2-3 drinks per year.  . Drug use: No  . Sexual activity: Not on file    Other Topics Concern  . Not on file   Social History Narrative  . No narrative on file    Past Surgical History:  Procedure Laterality Date  . Bunions surgery Right 2010 and 2012  . SHOULDER SURGERY Right 2012  . TONSILLECTOMY Bilateral 2012    Family History  Problem Relation Age of Onset  . Cancer Father        Brain    Allergies  Allergen Reactions  . Vicodin [Hydrocodone-Acetaminophen] Photosensitivity  . Dust Mite Extract     Sneezing,Watery eyes,Runny nose  . Sulfa Antibiotics     Upset stomach    Current Outpatient Prescriptions on File Prior to Visit  Medication Sig Dispense Refill  . azelastine (ASTELIN) 0.1 % nasal spray Place 2 sprays into both nostrils 2 (two) times daily. Use in each nostril as directed 30 mL 3  . azelastine (OPTIVAR) 0.05 % ophthalmic solution Place 2 drops into both eyes 2 (two) times daily.    . mometasone (NASONEX) 50 MCG/ACT nasal spray Place 2 sprays into the nose daily. 17 g 6   No current facility-administered medications on file prior to visit.     BP (!) 107/52   Pulse 82   Temp 98.1 F (36.7 C) (Oral)   Ht 5' 3"  (1.6 m)   Wt 139 lb 9.6 oz (63.3 kg)  SpO2 99%   BMI 24.73 kg/m       Objective:   Physical Exam  General- No acute distress. Pleasant patient. Neck- Full range of motion, no jvd Lungs- Clear, even and unlabored. Heart- regular rate and rhythm. Neurologic- CNII- XII grossly intact.  Skin/scalp- 4 cyst vs lipoma type areas on scalp. Each about size of a pea.   HEENT Head- Normal. Ear Auditory Canal - Left- Normal. Right - Normal.Tympanic Membrane- Left- Normal. Right- Normal. Eye Sclera/Conjunctiva- Left- Normal. Right- Normal. Nose & Sinuses Nasal Mucosa- Left-  Boggy and Congested. Right-  Boggy and  Congested.Bilateral  maxillary and frontal sinus pressure. Mouth & Throat Lips: Upper Lip- Normal: no dryness, cracking, pallor, cyanosis, or vesicular eruption. Lower Lip-Normal: no dryness,  cracking, pallor, cyanosis or vesicular eruption. Buccal Mucosa- Bilateral- No Aphthous ulcers. Oropharynx- No Discharge or Erythema. Tonsils: Characteristics- Bilateral- No Erythema or Congestion. Size/Enlargement- Bilateral- No enlargement. Discharge- bilateral-None.         Assessment & Plan:  For scalp lesion(sebaceous cyst vs lipomas) will refer you to dermatologist. I did ask referral staff to find office that would consider excising al the same day.  For sinus infection will rx augmentin and your nasal sprays.  If your sinus symptoms worsen or persist notify us.  Follow up in 7-10 days or as needed  Roel Douthat, Percell Miller, [employer]

## 2017-07-16 NOTE — Patient Instructions (Addendum)
For scalp lesion(sebaceous cyst vs lipomas) will refer you to dermatologist.  For sinus infection will rx augmentin and your nasal sprays.  If your sinus symptoms worsen or persist notify us.  Follow up in 7-10 days or as needed

## 2017-07-28 DIAGNOSIS — L986 Other infiltrative disorders of the skin and subcutaneous tissue: Secondary | ICD-10-CM | POA: Diagnosis not present

## 2017-07-28 DIAGNOSIS — L729 Follicular cyst of the skin and subcutaneous tissue, unspecified: Secondary | ICD-10-CM | POA: Diagnosis not present

## 2017-07-28 DIAGNOSIS — D485 Neoplasm of uncertain behavior of skin: Secondary | ICD-10-CM | POA: Diagnosis not present

## 2017-07-28 DIAGNOSIS — L720 Epidermal cyst: Secondary | ICD-10-CM | POA: Diagnosis not present

## 2017-08-05 DIAGNOSIS — L720 Epidermal cyst: Secondary | ICD-10-CM | POA: Diagnosis not present

## 2017-08-05 DIAGNOSIS — L729 Follicular cyst of the skin and subcutaneous tissue, unspecified: Secondary | ICD-10-CM | POA: Diagnosis not present

## 2017-08-18 ENCOUNTER — Encounter: Payer: Self-pay | Admitting: Medical

## 2017-08-19 DIAGNOSIS — L720 Epidermal cyst: Secondary | ICD-10-CM | POA: Diagnosis not present

## 2017-08-19 DIAGNOSIS — L729 Follicular cyst of the skin and subcutaneous tissue, unspecified: Secondary | ICD-10-CM | POA: Diagnosis not present

## 2017-08-20 ENCOUNTER — Telehealth: Payer: Self-pay | Admitting: Medical

## 2017-08-20 NOTE — Telephone Encounter (Signed)
Pt says that she would like to stop smoking so she would like a Rx for chant ix   Pharmacy: [facility]

## 2017-08-21 NOTE — Telephone Encounter (Signed)
Pt wants to be on chantix to stop smoking. Double check and make sure no history of depression and still on mirena. If no depression and on mirena then will rx the chantix.

## 2017-08-23 ENCOUNTER — Telehealth: Payer: Self-pay | Admitting: Medical

## 2017-08-23 MED ORDER — VARENICLINE TARTRATE 0.5 MG X 11 & 1 MG X 42 PO MISC: ORAL | 0 refills | Status: DC

## 2017-08-23 MED FILL — CHANTIX STARTING MONTH BOX: 0.5 MG X 11 | 28 days supply | Qty: 53 | Fill #0

## 2017-08-23 NOTE — Telephone Encounter (Signed)
Reprinted chantix second time. Not sure why did not print or staff did not fax it down stairs?

## 2017-08-23 NOTE — Telephone Encounter (Signed)
Rx faxed to pharmacy  

## 2017-08-23 NOTE — Telephone Encounter (Signed)
chantix starter pack rx printed. Update [name] when finished with that pack how she is or sooner if side effects from med. Will refill on one month if doing well.

## 2017-08-23 NOTE — Telephone Encounter (Signed)
No history of depression and pt states she is still on mirena.

## 2017-09-25 ENCOUNTER — Emergency Department (HOSPITAL_BASED_OUTPATIENT_CLINIC_OR_DEPARTMENT_OTHER): Payer: BLUE CROSS/BLUE SHIELD

## 2017-09-25 ENCOUNTER — Inpatient Hospital Stay (HOSPITAL_BASED_OUTPATIENT_CLINIC_OR_DEPARTMENT_OTHER)
Admission: EM | Admit: 2017-09-25 | Discharge: 2017-09-28 | DRG: 301 | Disposition: A | Discharge status: Home / Self Care | Payer: BLUE CROSS/BLUE SHIELD | Attending: Vascular Surgery | Admitting: Vascular Surgery

## 2017-09-25 ENCOUNTER — Encounter (HOSPITAL_BASED_OUTPATIENT_CLINIC_OR_DEPARTMENT_OTHER): Payer: Self-pay | Admitting: Emergency Medicine

## 2017-09-25 DIAGNOSIS — Z882 Allergy status to sulfonamides status: Secondary | ICD-10-CM | POA: Diagnosis not present

## 2017-09-25 DIAGNOSIS — K551 Chronic vascular disorders of intestine: Secondary | ICD-10-CM | POA: Diagnosis present

## 2017-09-25 DIAGNOSIS — K219 Gastro-esophageal reflux disease without esophagitis: Secondary | ICD-10-CM | POA: Diagnosis not present

## 2017-09-25 DIAGNOSIS — Z7951 Long term (current) use of inhaled steroids: Secondary | ICD-10-CM

## 2017-09-25 DIAGNOSIS — Z8541 Personal history of malignant neoplasm of cervix uteri: Secondary | ICD-10-CM

## 2017-09-25 DIAGNOSIS — Z885 Allergy status to narcotic agent status: Secondary | ICD-10-CM | POA: Diagnosis not present

## 2017-09-25 DIAGNOSIS — R1033 Periumbilical pain: Secondary | ICD-10-CM | POA: Diagnosis not present

## 2017-09-25 DIAGNOSIS — I7779 Dissection of other artery: Principal | ICD-10-CM | POA: Diagnosis present

## 2017-09-25 DIAGNOSIS — Z91048 Other nonmedicinal substance allergy status: Secondary | ICD-10-CM

## 2017-09-25 DIAGNOSIS — M549 Dorsalgia, unspecified: Secondary | ICD-10-CM | POA: Diagnosis not present

## 2017-09-25 DIAGNOSIS — Z79899 Other long term (current) drug therapy: Secondary | ICD-10-CM | POA: Diagnosis not present

## 2017-09-25 DIAGNOSIS — R109 Unspecified abdominal pain: Secondary | ICD-10-CM | POA: Diagnosis not present

## 2017-09-25 DIAGNOSIS — F17210 Nicotine dependence, cigarettes, uncomplicated: Secondary | ICD-10-CM | POA: Diagnosis not present

## 2017-09-25 LAB — CBC
HCT: 39.5 % (ref 36.0–46.0)
Hemoglobin: 13.4 g/dL (ref 12.0–15.0)
MCH: 31.5 pg (ref 26.0–34.0)
MCHC: 33.9 g/dL (ref 30.0–36.0)
MCV: 92.7 fL (ref 78.0–100.0)
PLATELETS: 193 10*3/uL (ref 150–400)
RBC: 4.26 MIL/uL (ref 3.87–5.11)
RDW: 12.3 % (ref 11.5–15.5)
WBC: 6.9 10*3/uL (ref 4.0–10.5)

## 2017-09-25 LAB — URINALYSIS, ROUTINE W REFLEX MICROSCOPIC
Bilirubin Urine: NEGATIVE
Glucose, UA: NEGATIVE mg/dL
Ketones, ur: NEGATIVE mg/dL
LEUKOCYTES UA: NEGATIVE
NITRITE: NEGATIVE
PH: 6.5 (ref 5.0–8.0)
Protein, ur: NEGATIVE mg/dL
SPECIFIC GRAVITY, URINE: 1.015 (ref 1.005–1.030)

## 2017-09-25 LAB — COMPREHENSIVE METABOLIC PANEL
ALT: 52 U/L (ref 14–54)
AST: 40 U/L (ref 15–41)
Albumin: 4.1 g/dL (ref 3.5–5.0)
Alkaline Phosphatase: 71 U/L (ref 38–126)
Anion gap: 7 (ref 5–15)
BUN: 11 mg/dL (ref 6–20)
CHLORIDE: 106 mmol/L (ref 101–111)
CO2: 24 mmol/L (ref 22–32)
CREATININE: 0.92 mg/dL (ref 0.44–1.00)
Calcium: 9 mg/dL (ref 8.9–10.3)
Glucose, Bld: 92 mg/dL (ref 65–99)
Potassium: 3.5 mmol/L (ref 3.5–5.1)
Sodium: 137 mmol/L (ref 135–145)
Total Bilirubin: 0.4 mg/dL (ref 0.3–1.2)
Total Protein: 7.1 g/dL (ref 6.5–8.1)

## 2017-09-25 LAB — I-STAT CG4 LACTIC ACID, ED: LACTIC ACID, VENOUS: 0.66 mmol/L (ref 0.5–1.9)

## 2017-09-25 LAB — URINALYSIS, MICROSCOPIC (REFLEX)

## 2017-09-25 LAB — HEPARIN LEVEL (UNFRACTIONATED): Heparin Unfractionated: 0.54 IU/mL (ref 0.30–0.70)

## 2017-09-25 LAB — LIPASE, BLOOD: LIPASE: 37 U/L (ref 11–51)

## 2017-09-25 MED ORDER — PANTOPRAZOLE SODIUM 40 MG PO TBEC
40.0000 mg | DELAYED_RELEASE_TABLET | Freq: Every day | ORAL | Status: DC
  Administered 2017-09-25 – 2017-09-28 (×4): 40 mg via ORAL
  Filled 2017-09-25 (×4): qty 1

## 2017-09-25 MED ORDER — POTASSIUM CHLORIDE CRYS ER 20 MEQ PO TBCR
20.0000 meq | EXTENDED_RELEASE_TABLET | Freq: Once | ORAL | Status: AC
  Administered 2017-09-25: 20 meq via ORAL
  Filled 2017-09-25: qty 1

## 2017-09-25 MED ORDER — HEPARIN BOLUS VIA INFUSION
2000.0000 [IU] | Freq: Once | INTRAVENOUS | Status: AC
  Administered 2017-09-25: 2000 [IU] via INTRAVENOUS

## 2017-09-25 MED ORDER — HYDRALAZINE HCL 20 MG/ML IJ SOLN: 5.0000 mg | INTRAMUSCULAR | Status: DC | PRN

## 2017-09-25 MED ORDER — DOCUSATE SODIUM 100 MG PO CAPS
100.0000 mg | ORAL_CAPSULE | Freq: Two times a day (BID) | ORAL | Status: DC
  Administered 2017-09-25 – 2017-09-28 (×7): 100 mg via ORAL
  Filled 2017-09-25 (×7): qty 1

## 2017-09-25 MED ORDER — HYDROMORPHONE HCL 1 MG/ML IJ SOLN
0.5000 mg | Freq: Once | INTRAMUSCULAR | Status: AC
  Administered 2017-09-25: 0.5 mg via INTRAVENOUS
  Filled 2017-09-25: qty 1

## 2017-09-25 MED ORDER — ASPIRIN EC 81 MG PO TBEC
81.0000 mg | DELAYED_RELEASE_TABLET | Freq: Every day | ORAL | Status: DC
  Administered 2017-09-25 – 2017-09-28 (×4): 81 mg via ORAL
  Filled 2017-09-25 (×4): qty 1

## 2017-09-25 MED ORDER — PHENOL 1.4 % MT LIQD: 1.0000 | OROMUCOSAL | Status: DC | PRN

## 2017-09-25 MED ORDER — OXYCODONE HCL 5 MG PO TABS: 5.0000 mg | ORAL_TABLET | ORAL | Status: DC | PRN

## 2017-09-25 MED ORDER — PANTOPRAZOLE SODIUM 40 MG PO TBEC: 40.0000 mg | DELAYED_RELEASE_TABLET | Freq: Every day | ORAL | Status: DC

## 2017-09-25 MED ORDER — ONDANSETRON HCL 4 MG/2ML IJ SOLN
INTRAMUSCULAR | Status: AC
  Filled 2017-09-25: qty 2

## 2017-09-25 MED ORDER — GI COCKTAIL ~~LOC~~
30.0000 mL | Freq: Once | ORAL | Status: AC
  Administered 2017-09-25: 30 mL via ORAL
  Filled 2017-09-25: qty 30

## 2017-09-25 MED ORDER — ONDANSETRON HCL 4 MG/2ML IJ SOLN
4.0000 mg | Freq: Once | INTRAMUSCULAR | Status: AC
  Administered 2017-09-25: 4 mg via INTRAVENOUS
  Filled 2017-09-25: qty 2

## 2017-09-25 MED ORDER — IOPAMIDOL (ISOVUE-300) INJECTION 61%
100.0000 mL | Freq: Once | INTRAVENOUS | Status: AC | PRN
  Administered 2017-09-25: 100 mL via INTRAVENOUS

## 2017-09-25 MED ORDER — IOPAMIDOL (ISOVUE-370) INJECTION 76%
100.0000 mL | Freq: Once | INTRAVENOUS | Status: AC | PRN
  Administered 2017-09-25: 100 mL via INTRAVENOUS

## 2017-09-25 MED ORDER — ACETAMINOPHEN 325 MG PO TABS
650.0000 mg | ORAL_TABLET | Freq: Four times a day (QID) | ORAL | Status: DC | PRN
  Administered 2017-09-25 – 2017-09-26 (×3): 650 mg via ORAL
  Filled 2017-09-25 (×3): qty 2

## 2017-09-25 MED ORDER — SODIUM CHLORIDE 0.9 % IV BOLUS (SEPSIS)
1000.0000 mL | Freq: Once | INTRAVENOUS | Status: AC
  Administered 2017-09-25: 1000 mL via INTRAVENOUS

## 2017-09-25 MED ORDER — ONDANSETRON HCL 4 MG/2ML IJ SOLN
4.0000 mg | Freq: Four times a day (QID) | INTRAMUSCULAR | Status: DC | PRN
  Administered 2017-09-26: 4 mg via INTRAVENOUS
  Filled 2017-09-25: qty 2

## 2017-09-25 MED ORDER — DICYCLOMINE HCL 10 MG PO CAPS
10.0000 mg | ORAL_CAPSULE | Freq: Once | ORAL | Status: AC
  Administered 2017-09-25: 10 mg via ORAL
  Filled 2017-09-25: qty 1

## 2017-09-25 MED ORDER — ONDANSETRON HCL 4 MG/2ML IJ SOLN
4.0000 mg | Freq: Once | INTRAMUSCULAR | Status: AC | PRN
  Administered 2017-09-25: 4 mg via INTRAVENOUS

## 2017-09-25 MED ORDER — HYDROMORPHONE HCL 1 MG/ML IJ SOLN
1.0000 mg | Freq: Once | INTRAMUSCULAR | Status: AC
  Administered 2017-09-25: 1 mg via INTRAVENOUS
  Filled 2017-09-25: qty 1

## 2017-09-25 MED ORDER — KCL IN DEXTROSE-NACL 20-5-0.45 MEQ/L-%-% IV SOLN
INTRAVENOUS | Status: DC
  Administered 2017-09-25: 1 mL via INTRAVENOUS
  Administered 2017-09-25 – 2017-09-26 (×2): via INTRAVENOUS
  Administered 2017-09-27: 1 mL via INTRAVENOUS
  Administered 2017-09-27: 11:00:00 via INTRAVENOUS
  Filled 2017-09-25 (×5): qty 1000

## 2017-09-25 MED ORDER — ALUM & MAG HYDROXIDE-SIMETH 200-200-20 MG/5ML PO SUSP: 15.0000 mL | ORAL | Status: DC | PRN

## 2017-09-25 MED ORDER — GUAIFENESIN-DM 100-10 MG/5ML PO SYRP: 15.0000 mL | ORAL_SOLUTION | ORAL | Status: DC | PRN

## 2017-09-25 MED ORDER — METOPROLOL TARTRATE 5 MG/5ML IV SOLN: 2.0000 mg | INTRAVENOUS | Status: DC | PRN

## 2017-09-25 MED ORDER — FLUTICASONE PROPIONATE 50 MCG/ACT NA SUSP
1.0000 | Freq: Every day | NASAL | Status: DC
  Filled 2017-09-25: qty 16

## 2017-09-25 MED ORDER — LABETALOL HCL 5 MG/ML IV SOLN
10.0000 mg | INTRAVENOUS | Status: DC | PRN
  Filled 2017-09-25: qty 4

## 2017-09-25 MED ORDER — MORPHINE SULFATE (PF) 4 MG/ML IV SOLN
2.0000 mg | INTRAVENOUS | Status: DC | PRN
  Administered 2017-09-25: 2 mg via INTRAVENOUS
  Filled 2017-09-25: qty 1

## 2017-09-25 MED ORDER — HEPARIN (PORCINE) IN NACL 100-0.45 UNIT/ML-% IJ SOLN
950.0000 [IU]/h | INTRAMUSCULAR | Status: DC
  Administered 2017-09-25 – 2017-09-26 (×2): 1000 [IU]/h via INTRAVENOUS
  Filled 2017-09-25 (×3): qty 250

## 2017-09-25 MED ORDER — FENTANYL CITRATE (PF) 100 MCG/2ML IJ SOLN
50.0000 ug | INTRAMUSCULAR | Status: DC | PRN
  Administered 2017-09-25: 50 ug via INTRAVENOUS
  Filled 2017-09-25: qty 2

## 2017-09-25 NOTE — Progress Notes (Signed)
   Abdomen feeling better Has tolerated clears Will continue heparin drip for now along with aspirin Doubtful she will need long-term anticoagulation.  Will continue with serial abdominal exams.   Brandon C. Donzetta Matters, MD [facility] Office: [phone] Pager: [phone]

## 2017-09-25 NOTE — ED Triage Notes (Signed)
Pt c/o sudden onset of abd pain in umbilical area and then started having pain in lower back. Pt states she had a hot pepper earlier today so she took nexium without relief.

## 2017-09-25 NOTE — ED Provider Notes (Signed)
[facility] DEPT [facility] Provider Note   CSN: [id number] Arrival date & time: 09/25/17  0118     History   Chief Complaint Chief Complaint  Patient presents with  . Abdominal Pain  . Back Pain    HPI Lindsey Macias is a 41 y.o. female.  The history is provided by the patient. No language interpreter was used.  Abdominal Pain   This is a new problem.  Back Pain   Associated symptoms include abdominal pain.   Lindsey Macias is a 41 y.o. female who presents to the Emergency Department complaining of abdominal pain.  This afternoon she developed periumbilical abdominal pain that radiates upward to her epigastrium and towards her back. She took a Nexium at home with no significant change in her symptoms. Pain is constant in nature and has been worsening throughout the day. No fevers, vomiting, diarrhea, dysuria. No prior similar symptoms. No history of prior abdominal surgeries. She has a history of reflux but symptoms today are different.  Sxs are worsening.   Past Medical History:  Diagnosis Date  . Allergy   . Cancer [facility]) 2003   cervical cancer 2002 and 2003. had leep procedure.  Marland Kitchen GERD (gastroesophageal reflux disease)    but actually had h pylori and took meds then got better.    There are no active problems to display for this patient.   Past Surgical History:  Procedure Laterality Date  . Bunions surgery Right 2010 and 2012  . SHOULDER SURGERY Right 2012  . TONSILLECTOMY Bilateral 2012    OB History    No data available       Home Medications    Prior to Admission medications   Medication Sig Start Date End Date Taking? Authorizing Provider  esomeprazole (NEXIUM) 20 MG capsule Take 20 mg by mouth once.   Yes [provider]    Family History Family History  Problem Relation Age of Onset  . Cancer Father        Brain    Social History Social History  Substance Use Topics  . Smoking status: Current Some Day Smoker    Packs/day: 0.00   Types: Cigarettes    Start date: 11/23/1994  . Smokeless tobacco: Never Used  . Alcohol use Yes     Comment: Pt states she drinks 2-3 drinks per year.     Allergies   Vicodin [hydrocodone-acetaminophen]; Dust mite extract; and Sulfa antibiotics   Review of Systems Review of Systems  Gastrointestinal: Positive for abdominal pain.  Musculoskeletal: Positive for back pain.  All other systems reviewed and are negative.    Physical Exam Updated Vital Signs BP 121/78 (BP Location: Right Arm)   Pulse 72   Temp 97.8 F (36.6 C) (Oral)   Resp 16   Ht 5' 3"  (1.6 m)   Wt 61.2 kg (135 lb)   SpO2 100%   BMI 23.91 kg/m   Physical Exam  Constitutional: She is oriented to person, place, and time. She appears well-developed and well-nourished.  HENT:  Head: Normocephalic and atraumatic.  Cardiovascular: Normal rate and regular rhythm.   No murmur heard. Pulmonary/Chest: Effort normal and breath sounds normal. No respiratory distress.  Abdominal: Soft. There is no rebound and no guarding.  Mild diffuse tenderness  Musculoskeletal: She exhibits no edema or tenderness.  Neurological: She is alert and oriented to person, place, and time.  Skin: Skin is warm and dry.  Psychiatric: She has a normal mood and affect. Her behavior is  normal.  Nursing note and vitals reviewed.    ED Treatments / Results  Labs (all labs ordered are listed, but only abnormal results are displayed) Labs Reviewed  URINALYSIS, ROUTINE W REFLEX MICROSCOPIC - Abnormal; Notable for the following:       Result Value   Hgb urine dipstick TRACE (*)    All other components within normal limits  URINALYSIS, MICROSCOPIC (REFLEX) - Abnormal; Notable for the following:    Bacteria, UA MANY (*)    Squamous Epithelial / LPF 0-5 (*)    All other components within normal limits  LIPASE, BLOOD  COMPREHENSIVE METABOLIC PANEL  CBC  I-STAT CG4 LACTIC ACID, ED    EKG  EKG Interpretation None        Radiology Ct Angio Abdomen W And/or Wo Contrast  Result Date: 09/25/2017 CLINICAL DATA:  Acute onset of umbilical pain and lower back pain. Initial encounter. EXAM: CT ANGIOGRAPHY ABDOMEN TECHNIQUE: Multidetector CT imaging of the abdomen was performed using the standard protocol during bolus administration of intravenous contrast. Multiplanar reconstructed images and MIPs were obtained and reviewed to evaluate the vascular anatomy. CONTRAST:  80 mL of Isovue 370 IV contrast COMPARISON:  CT of the abdomen and pelvis performed earlier today at 5:01 a.m. FINDINGS: VASCULAR Aorta: The abdominal aorta is unremarkable in appearance. No calcific atherosclerotic disease is seen. Celiac: The celiac trunk is unremarkable in appearance. 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 This begins just distal to the origin of the right hepatic artery from the SMA, ends just above the bifurcation of the SMA, and measures approximately 3 cm in length. There is associated dilatation at the site of thrombus, new from June. This appearance is suspicious for a thrombosed dissection flap. Given the acute symptoms and slight surrounding haziness, this is thought to reflect a relatively acute dissection and thrombus. There is minimal narrowing of the superior mesenteric artery at the site of thrombus, in comparison to the prior CT from June. There is mild dilatation of the superior mesenteric artery just distal to the level of thrombosis. No distal clot is seen. Renals: The renal arteries appear intact bilaterally. IMA: The inferior mesenteric artery remains patent. Inflow: The common, internal and external iliac arteries are unremarkable in appearance. The common femoral arteries and their proximal branches are grossly unremarkable. Veins: The visualized venous structures are unremarkable. Review of the MIP images confirms the  above findings. NON-VASCULAR Lower chest: A 9 mm nodule is again noted at the right middle lung lobe. The visualized portions of the mediastinum are unremarkable. Hepatobiliary: The liver is unremarkable in appearance. The gallbladder is unremarkable in appearance. The common bile duct remains normal in caliber. Pancreas: The pancreas is within normal limits. Spleen: The spleen is unremarkable in appearance. Adrenals/Urinary Tract: The adrenal glands are unremarkable in appearance. The kidneys are within normal limits. There is no evidence of hydronephrosis. No renal or ureteral stones are identified, though evaluation for stones is limited given contrast in the renal calyces. No perinephric stranding is seen. Stomach/Bowel: The stomach is unremarkable in appearance. The small bowel is within normal limits. The appendix is normal in caliber, without evidence of appendicitis. The colon is unremarkable in appearance. Lymphatic: No retroperitoneal or pelvic sidewall lymphadenopathy is seen. Other: The bladder is partially filled with contrast and grossly unremarkable. The uterus is unremarkable in appearance, with an intrauterine device in expected position at  the fundus of the uterus. The ovaries are relatively symmetric. No suspicious adnexal masses are seen. Musculoskeletal: No acute osseous abnormalities are identified. The visualized musculature is unremarkable in appearance. IMPRESSION: VASCULAR 1. 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 This begins just distal to the origin of the right hepatic artery from the SMA, ends just above the bifurcation of the SMA, and measures approximately 3 cm in length. Associated dilatation at the site of thrombus, new from June. This appearance is suspicious for a thrombosed dissection flap. Given acute symptoms and slight surrounding soft tissue haziness, this is thought to reflect a relatively acute dissection and  thrombus. 2. Minimal associated narrowing of the superior mesenteric artery at the site of thrombus, in comparison to the prior CT from June. Mild dilatation of the SMA just distal to the level of thrombosis. Vascular consultation is recommended, as deemed clinically appropriate. NON-VASCULAR 9 mm nodule at the right middle lung lobe. Consider one of the following in 3 months for both low-risk and high-risk individuals: (a) repeat chest CT, (b) follow-up PET-CT, or (c) tissue sampling. This recommendation follows the consensus statement: Guidelines for Management of Incidental Pulmonary Nodules Detected on CT Images: From the Fleischner Society 2017; Radiology 2017; 284:228-243. These results were called by telephone at the time of interpretation on 09/25/2017 at 6:26 am to Dr. Quintella Reichert, who verbally acknowledged these results. Electronically Signed   By: Garald Balding M.D.   On: 09/25/2017 06:41   Ct Abdomen Pelvis W Contrast  Result Date: 09/25/2017 CLINICAL DATA:  Acute onset of periumbilical abdominal pain and lower back pain. Initial encounter. EXAM: CT ABDOMEN AND PELVIS WITH CONTRAST TECHNIQUE: Multidetector CT imaging of the abdomen and pelvis was performed using the standard protocol following bolus administration of intravenous contrast. CONTRAST:  114m ISOVUE-300 IOPAMIDOL (ISOVUE-300) INJECTION 61% COMPARISON:  CT of the abdomen and pelvis from 05/18/2017 FINDINGS: Lower chest: An 9 mm nodule is noted at the right middle lung lobe. The visualized portions of the mediastinum are unremarkable. Hepatobiliary: The liver is unremarkable in appearance. The gallbladder is unremarkable in appearance. The common bile duct remains normal in caliber. Pancreas: The pancreas is within normal limits. Spleen: The spleen is unremarkable in appearance. Adrenals/Urinary Tract: The adrenal glands are unremarkable in appearance. The kidneys are within normal limits. There is no evidence of hydronephrosis. No  renal or ureteral stones are identified. No perinephric stranding is seen. Stomach/Bowel: The stomach is unremarkable in appearance. The small bowel is within normal limits. The appendix is normal in caliber, without evidence of appendicitis. The colon is unremarkable in appearance. Vascular/Lymphatic: The abdominal aorta is unremarkable in appearance. There is unusual soft tissue density tracking about the course of the proximal superior mesenteric artery, measuring 3-4 cm in length and 5 mm in thickness, 2 cm distal to the origin of the artery. 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 There is associated mild to moderate narrowing of the proximal superior mesenteric artery. The artery was normal in caliber and fully patent in June. The inferior vena cava is grossly unremarkable. A circumaortic left renal vein is noted. No retroperitoneal lymphadenopathy is seen. No pelvic sidewall lymphadenopathy is identified. Reproductive: The bladder is mildly distended and grossly unremarkable. The uterus is unremarkable in appearance. An intrauterine device is noted in expected position at the fundus of the uterus. The ovaries are relatively  symmetric. No suspicious adnexal masses are seen. Other: No additional soft tissue abnormalities are seen. Musculoskeletal: No acute osseous abnormalities are identified. The visualized musculature is unremarkable in appearance. IMPRESSION: 1. Unusual soft tissue density tracking about the course of the proximal superior mesenteric artery, measuring 3-4 cm in length and 5 mm in thickness, 2 cm distal to the origin of the artery. 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 Associated mild to moderate narrowing of the proximal superior mesenteric artery; the artery was normal  in caliber and fully patent in June. Mesenteric CTA is recommended for further evaluation. Vascular consultation could be considered, as deemed clinically appropriate. 2. **An incidental finding of potential clinical significance has been found. 9 mm nodule at the right middle lung lobe. Consider one of the following in 3 months for both low-risk and high-risk individuals: (a) repeat chest CT, (b) follow-up PET-CT, or (c) tissue sampling. This recommendation follows the consensus statement: Guidelines for Management of Incidental Pulmonary Nodules Detected on CT Images: From the Fleischner Society 2017; Radiology 2017; 284:228-243.** 3. Circumaortic left renal vein incidentally noted. These results were called by telephone at the time of interpretation on 09/25/2017 at 5:22 am to Dr. Quintella Reichert, who verbally acknowledged these results. Electronically Signed   By: Garald Balding M.D.   On: 09/25/2017 05:32    Procedures Procedures (including critical care time) CRITICAL CARE Performed by: Quintella Reichert   Total critical care time: 35 minutes  Critical care time was exclusive of separately billable procedures and treating other patients.  Critical care was necessary to treat or prevent imminent or life-threatening deterioration.  Critical care was time spent personally by me on the following activities: development of treatment plan with patient and/or surrogate as well as nursing, discussions with consultants, evaluation of patient's response to treatment, examination of patient, obtaining history from patient or surrogate, ordering and performing treatments and interventions, ordering and review of laboratory studies, ordering and review of radiographic studies, pulse oximetry and re-evaluation of patient's condition.  Medications Ordered in ED Medications  fentaNYL (SUBLIMAZE) injection 50 mcg (50 mcg Intravenous Given 09/25/17 0241)  heparin ADULT infusion 100 units/mL (25000 units/244m  sodium chloride 0.45%) (1,000 Units/hr Intravenous New Bag/Given 09/25/17 0651)  ondansetron (ZOFRAN) injection 4 mg (4 mg Intravenous Given 09/25/17 0240)  gi cocktail (Maalox,Lidocaine,Donnatal) (30 mLs Oral Given 09/25/17 0408)  dicyclomine (BENTYL) capsule 10 mg (10 mg Oral Given 09/25/17 0408)  iopamidol (ISOVUE-300) 61 % injection 100 mL (100 mLs Intravenous Contrast Given 09/25/17 0452)  sodium chloride 0.9 % bolus 1,000 mL (0 mLs Intravenous Stopped 09/25/17 0717)  sodium chloride 0.9 % bolus 1,000 mL (1,000 mLs Intravenous New Bag/Given 09/25/17 0716)  iopamidol (ISOVUE-370) 76 % injection 100 mL (100 mLs Intravenous Contrast Given 09/25/17 0555)  ondansetron (ZOFRAN) injection 4 mg (4 mg Intravenous Given 09/25/17 0607)  HYDROmorphone (DILAUDID) injection 1 mg (1 mg Intravenous Given 09/25/17 0643)  heparin bolus via infusion 2,000 Units (2,000 Units Intravenous Bolus from Bag 09/25/17 0651)     Initial Impression / Assessment and Plan / ED Course  I have reviewed the triage vital signs and the nursing notes.  Pertinent labs & imaging results that were available during my care of the patient were reviewed by me and considered in my medical decision making (see chart for details).    Patient here for evaluation of abdominal pain rating to the back. Initial concern for  possible gastritis versus pancreatitis. She had no significant improvement in her symptoms following treatment with IV and oral medications. CT abdomen obtained with irregularity in the SMA, repeat imaging obtained after discussions with radiologist. 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 Heparin gtt started.   D/w Dr. Donzetta Matters with Vascular Surgery - recommends transfer to [facility] ED for emergent evaluation.  D/w Dr. Randal Buba in [facility] ED regarding patient's transfer.    Patient updated of findings the studies and recommendation for transfer for further evaluation by vascular. She is in agreement with  plan.  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

## 2017-09-25 NOTE — ED Provider Notes (Signed)
9:36 AM patient sent to the emergency department from [place] with superior mesenteric artery dissection and thrombus.  I reviewed patient's CT results. Plan is for her to see vascular surgery She is currently on heparin.  She has minimal abdominal pain at the current moment, but states that it is getting worse. Additional pain medication ordered. Abdomen is soft and nontender. She appears well. No history of blood clots.   BP (!) 133/96 (BP Location: Right Arm)   Pulse 73   Temp 97.8 F (36.6 C) (Oral)   Resp 20   Ht 5' 3"  (1.6 m)   Wt 61.2 kg (135 lb)   SpO2 100%   BMI 23.91 kg/m     Carlisle Cater, PA-C 09/25/17 0802    Little, Wenda Overland, MD 09/26/17 1008

## 2017-09-25 NOTE — Progress Notes (Signed)
[place] [facility] for Heparin Indication: SMA thrombosis  Allergies  Allergen Reactions  . Vicodin [Hydrocodone-Acetaminophen] Photosensitivity  . Dust Mite Extract     Sneezing,Watery eyes,Runny nose  . Sulfa Antibiotics     Upset stomach    Patient Measurements: Height: 5' 3"  (160 cm) Weight: 152 lb 8 oz (69.2 kg) IBW/kg (Calculated) : 52.4  Vital Signs: Temp: 97.9 F (36.6 C) (10/13 1512) Temp Source: Oral (10/13 1512) BP: 106/60 (10/13 1512) Pulse Rate: 67 (10/13 1512)  Labs:  Recent Labs  09/25/17 0235 09/25/17 1445  HGB 13.4  --   HCT 39.5  --   PLT 193  --   HEPARINUNFRC  --  0.54  CREATININE 0.92  --     Estimated Creatinine Clearance: 74.3 mL/min (by C-G formula based on SCr of 0.92 mg/dL).   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 Initial heparin level is therapeutic at 0.54. No bleeding noted.   Goal of Therapy:  Heparin level 0.3-0.7 units/ml Monitor platelets by anticoagulation protocol: Yes   Plan:  Continue heparin gtt 1000 units/hr Daily heparin level and CBC   Roniya Tetro, Rande Lawman 09/25/2017,3:32 PM

## 2017-09-25 NOTE — Progress Notes (Signed)
ANTICOAGULATION CONSULT NOTE - Initial Consult  Pharmacy Consult for Heparin Indication: SMA thrombosis  Allergies  Allergen Reactions  . Vicodin [Hydrocodone-Acetaminophen] Photosensitivity  . Dust Mite Extract     Sneezing,Watery eyes,Runny nose  . Sulfa Antibiotics     Upset stomach    Patient Measurements: Height: 5' 3"  (160 cm) Weight: 135 lb (61.2 kg) IBW/kg (Calculated) : 52.4  Vital Signs: Temp: 97.9 F (36.6 C) (10/13 0553) Temp Source: Oral (10/13 0553) BP: 121/78 (10/13 0553) Pulse Rate: 79 (10/13 0553)  Labs:  Recent Labs  09/25/17 0235  HGB 13.4  HCT 39.5  PLT 193  CREATININE 0.92    Estimated Creatinine Clearance: 65.9 mL/min (by C-G formula based on SCr of 0.92 mg/dL).   Medical History: Past Medical History:  Diagnosis Date  . Allergy   . Cancer [facility]) 2003   cervical cancer 2002 and 2003. had leep procedure.  Marland Kitchen GERD (gastroesophageal reflux disease)    but actually had h pylori and took meds then got better.    Medications:  No current facility-administered medications on file prior to encounter.    No current outpatient prescriptions on file prior to encounter.     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   Rushil Kimbrell, Bronson Curb 09/25/2017,6:36 AM

## 2017-09-25 NOTE — H&P (Signed)
HP   Reason for Consult:  sma dissection Requesting Physician:  ED MRN #:  [id number]  History of Present Illness: This is a 41 y.o. female without significant past medical history presented early this morning with periumbilical pain that radiated to her back. She also has some epigastric pain. She has no chest pain associated. She denies nausea or vomiting or diarrhea. She has not had any blood in her stool ever. She does have a history of hemorrhoids that are not bothering her at this time. She has never had this issue before. She denies any hypertension or cardiovascular diseases does not have any family history of either. She recently quit smoking 1 month ago. She does not take blood thinners.  Past Medical History:  Diagnosis Date  . Allergy   . Cancer [facility]) 2003   cervical cancer 2002 and 2003. had leep procedure.  Marland Kitchen GERD (gastroesophageal reflux disease)    but actually had h pylori and took meds then got better.    Past Surgical History:  Procedure Laterality Date  . Bunions surgery Right 2010 and 2012  . SHOULDER SURGERY Right 2012  . TONSILLECTOMY Bilateral 2012    Allergies  Allergen Reactions  . Vicodin [Hydrocodone-Acetaminophen] Photosensitivity  . Dust Mite Extract     Sneezing,Watery eyes,Runny nose  . Sulfa Antibiotics     Upset stomach    Prior to Admission medications   Medication Sig Start Date End Date Taking? Authorizing Provider  esomeprazole (NEXIUM) 20 MG capsule Take 20 mg by mouth once.   Yes [provider]  mometasone (NASONEX) 50 MCG/ACT nasal spray Place 2 sprays into the nose at bedtime. 07/16/17  Yes [provider]    Social History   Social History  . Marital status: Divorced    Spouse name: N/A  . Number of children: N/A  . Years of education: N/A   Occupational History  . Not on file.   Social History Main Topics  . Smoking status: Current Some Day Smoker    Packs/day: 0.00    Types: Cigarettes    Start date:  11/23/1994  . Smokeless tobacco: Never Used  . Alcohol use Yes     Comment: Pt states she drinks 2-3 drinks per year.  . Drug use: No  . Sexual activity: Not on file   Other Topics Concern  . Not on file   Social History Narrative  . No narrative on file     Family History  Problem Relation Age of Onset  . Cancer Father        Brain   REVIEW OF SYSTEMS (negative unless checked):   Cardiac:  []  Chest pain or chest pressure? []  Shortness of breath upon activity? []  Shortness of breath when lying flat? []  Irregular heart rhythm?  Vascular:  []  Pain in calf, thigh, or hip brought on by walking? []  Pain in feet at night that wakes you up from your sleep? []  Blood clot in your veins? []  Leg swelling?  Pulmonary:  []  Oxygen at home? []  Productive cough? []  Wheezing?  Neurologic:  []  Sudden weakness in arms or legs? []  Sudden numbness in arms or legs? []  Sudden onset of difficult speaking or slurred speech? []  Temporary loss of vision in one eye? []  Problems with dizziness?  Gastrointestinal:  []  Blood in stool? [x]  abdominal pain  Genitourinary:  []  Burning when urinating? []  Blood in urine?  Psychiatric:  []  Major depression  Hematologic:  []  Bleeding problems? []  Problems with  blood clotting?  Dermatologic:  []  Rashes or ulcers?  Constitutional:  []  Fever or chills?  Ear/Nose/Throat:  []  Change in hearing? []  Nose bleeds? []  Sore throat?  Musculoskeletal:  [x]  Back pain? []  Joint pain? []  Muscle pain?    Physical Examination  Vitals:   09/25/17 0657 09/25/17 0736  BP:  (!) 133/96  Pulse: 72 73  Resp: 16 20  Temp: 97.8 F (36.6 C)   SpO2: 100% 100%   Body mass index is 23.91 kg/m.  General:  WDWN in NAD HENT: WNL, normocephalic Pulmonary: normal non-labored breathing Cardiac: rrr Palpable pedal pulses bilaterally Abdomen: soft, minimal ttp, no rebound or guarding Extremities: no cce Musculoskeletal: no muscle wasting or  atrophy  Neurologic: A&O X 3; Appropriate Affect ; SENSATION: normal; MOTOR FUNCTION:  moving all extremities equally. Speech is fluent/normal Psychiatric:  Appropriate mood and affect  CBC    Component Value Date/Time   WBC 6.9 09/25/2017 0235   RBC 4.26 09/25/2017 0235   HGB 13.4 09/25/2017 0235   HCT 39.5 09/25/2017 0235   PLT 193 09/25/2017 0235   MCV 92.7 09/25/2017 0235   MCH 31.5 09/25/2017 0235   MCHC 33.9 09/25/2017 0235   RDW 12.3 09/25/2017 0235   LYMPHSABS 2.4 05/17/2017 1543   MONOABS 0.5 05/17/2017 1543   EOSABS 0.1 05/17/2017 1543   BASOSABS 0.1 05/17/2017 1543    BMET    Component Value Date/Time   NA 137 09/25/2017 0235   K 3.5 09/25/2017 0235   CL 106 09/25/2017 0235   CO2 24 09/25/2017 0235   GLUCOSE 92 09/25/2017 0235   BUN 11 09/25/2017 0235   CREATININE 0.92 09/25/2017 0235   CALCIUM 9.0 09/25/2017 0235   GFRNONAA >60 09/25/2017 0235   GFRAA >60 09/25/2017 0235    COAGS: No results found for: INR, PROTIME   Non-Invasive Vascular Imaging:   CT VASCULAR  1. 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 This begins just distal to the origin of the right hepatic artery from the SMA, ends just above the bifurcation of the SMA, and measures approximately 3 cm in length. Associated dilatation at the site of thrombus, new from June. This appearance is suspicious for a thrombosed dissection flap. Given acute symptoms and slight surrounding soft tissue haziness, this is thought to reflect a relatively acute dissection and thrombus. 2. Minimal associated narrowing of the superior mesenteric artery at the site of thrombus, in comparison to the prior CT from June. Mild dilatation of the SMA just distal to the level of thrombosis. Vascular consultation is recommended, as deemed clinically appropriate.  ASSESSMENT/PLAN: This is a 41 y.o. 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 She does have minimal abdominal back pain although she has had pain medicine. She does not have an acute abdomen. On CT she does have flow past the lesion in the SMA is actually up to 1 cm. We will admit with bowel rest and heparin drip keep her on IV fluids she can have clear liquids and will give her aspirin. We'll recheck her abdominal exam later today but suspect she will not need intervention on this hospitalization. I discussed with her and she demonstrated understanding.  Kassiah Mccrory C. Donzetta Matters, MD [facility] [place] Office: [phone] Pager: [phone]

## 2017-09-25 NOTE — ED Notes (Signed)
Patient transferred from [facility] states she was fine yest was sitting on her couch last pm and felt a sudden onset of abd. Pain states it felt like someone had punched her in the stomach. States her pain had gone away however was starting to come back a little

## 2017-09-25 NOTE — Progress Notes (Signed)
Pt new admit from ED alert and oriented ambulatory complain of abd pain, on heparin drip.

## 2017-09-26 LAB — CBC
HCT: 35.7 % — ABNORMAL LOW (ref 36.0–46.0)
HCT: 35.8 % — ABNORMAL LOW (ref 36.0–46.0)
Hemoglobin: 12.1 g/dL (ref 12.0–15.0)
Hemoglobin: 12.4 g/dL (ref 12.0–15.0)
MCH: 31.9 pg (ref 26.0–34.0)
MCH: 31.6 pg (ref 26.0–34.0)
MCHC: 33.9 g/dL (ref 30.0–36.0)
MCHC: 34.6 g/dL (ref 30.0–36.0)
MCV: 94.2 fL (ref 78.0–100.0)
MCV: 91.1 fL (ref 78.0–100.0)
PLATELETS: UNDETERMINED 10*3/uL (ref 150–400)
Platelets: 134 10*3/uL — ABNORMAL LOW (ref 150–400)
RBC: 3.79 MIL/uL — ABNORMAL LOW (ref 3.87–5.11)
RBC: 3.93 MIL/uL (ref 3.87–5.11)
RDW: 12.9 % (ref 11.5–15.5)
RDW: 12.6 % (ref 11.5–15.5)
WBC: 4.9 10*3/uL (ref 4.0–10.5)
WBC: 5.4 10*3/uL (ref 4.0–10.5)

## 2017-09-26 LAB — COMPREHENSIVE METABOLIC PANEL
ALBUMIN: 3.1 g/dL — AB (ref 3.5–5.0)
ALK PHOS: 56 U/L (ref 38–126)
ALT: 33 U/L (ref 14–54)
ANION GAP: 6 (ref 5–15)
AST: 23 U/L (ref 15–41)
BILIRUBIN TOTAL: 0.7 mg/dL (ref 0.3–1.2)
BUN: <5 mg/dL — ABNORMAL LOW (ref 6–20)
CALCIUM: 8.1 mg/dL — AB (ref 8.9–10.3)
CO2: 20 mmol/L — ABNORMAL LOW (ref 22–32)
CREATININE: 0.8 mg/dL (ref 0.44–1.00)
Chloride: 111 mmol/L (ref 101–111)
GFR calc Af Amer: >60 mL/min (ref >60)
GFR calc non Af Amer: >60 mL/min (ref >60)
GLUCOSE: 92 mg/dL (ref 65–99)
Potassium: 3.9 mmol/L (ref 3.5–5.1)
Sodium: 137 mmol/L (ref 135–145)
TOTAL PROTEIN: 5.1 g/dL — AB (ref 6.5–8.1)

## 2017-09-26 LAB — HEPARIN LEVEL (UNFRACTIONATED): Heparin Unfractionated: 0.66 IU/mL (ref 0.30–0.70)

## 2017-09-26 LAB — CREATININE, SERUM
CREATININE: 0.89 mg/dL (ref 0.44–1.00)
GFR calc Af Amer: >60 mL/min (ref >60)
GFR calc non Af Amer: >60 mL/min (ref >60)

## 2017-09-26 LAB — HIV ANTIBODY (ROUTINE TESTING W REFLEX): HIV Screen 4th Generation wRfx: NONREACTIVE

## 2017-09-26 MED ORDER — ENOXAPARIN SODIUM 40 MG/0.4ML ~~LOC~~ SOLN
40.0000 mg | SUBCUTANEOUS | Status: DC
  Administered 2017-09-26 – 2017-09-27 (×2): 40 mg via SUBCUTANEOUS
  Filled 2017-09-26 (×2): qty 0.4

## 2017-09-26 NOTE — Progress Notes (Signed)
[place] for Heparin Indication: SMA thrombosis  Allergies  Allergen Reactions  . Vicodin [Hydrocodone-Acetaminophen] Photosensitivity  . Dust Mite Extract     Sneezing,Watery eyes,Runny nose  . Sulfa Antibiotics     Upset stomach    Patient Measurements: Height: 5' 3"  (160 cm) Weight: 152 lb 8 oz (69.2 kg) IBW/kg (Calculated) : 52.4  Assessment: 41 y.o. female on heparin gtt for SMA thrombus. Heparin level this am remains therapeutic at 0.66. CBC stable.  Goal of Therapy:  Heparin level 0.3-0.7 units/ml Monitor platelets by anticoagulation protocol: Yes   Plan:  Decrease heparin gtt slightly to 950 units/hr Monitor daily heparin level, CBC, s/s of bleed F/U transition to PO agent  Eberardo Demello J 09/26/2017,8:34 AM

## 2017-09-26 NOTE — Progress Notes (Signed)
  Progress Note    09/26/2017 12:43 PM * No surgery found *  Subjective:  Abdomen feeling better  Vitals:   09/26/17 0216 09/26/17 0601  BP: 110/60 125/60  Pulse: 60 64  Resp: 16 17  Temp: 98 F (36.7 C) 98.3 F (36.8 C)  SpO2: 97% 97%    Physical Exam: aaox3 Palpable pedal pulses Abdomen is soft  CBC    Component Value Date/Time   WBC 4.9 09/26/2017 0502   RBC 3.79 (L) 09/26/2017 0502   HGB 12.1 09/26/2017 0502   HCT 35.7 (L) 09/26/2017 0502   PLT 134 (L) 09/26/2017 0502   MCV 94.2 09/26/2017 0502   MCH 31.9 09/26/2017 0502   MCHC 33.9 09/26/2017 0502   RDW 12.9 09/26/2017 0502   LYMPHSABS 2.4 05/17/2017 1543   MONOABS 0.5 05/17/2017 1543   EOSABS 0.1 05/17/2017 1543   BASOSABS 0.1 05/17/2017 1543    BMET    Component Value Date/Time   NA 137 09/26/2017 0502   K 3.9 09/26/2017 0502   CL 111 09/26/2017 0502   CO2 20 (L) 09/26/2017 0502   GLUCOSE 92 09/26/2017 0502   BUN <5 (L) 09/26/2017 0502   CREATININE 0.80 09/26/2017 0502   CALCIUM 8.1 (L) 09/26/2017 0502   GFRNONAA >60 09/26/2017 0502   GFRAA >60 09/26/2017 0502    INR No results found for: INR   Intake/Output Summary (Last 24 hours) at 09/26/17 1243 Last data filed at 09/25/17 1800  Gross per 24 hour  Intake           498.17 ml  Output                0 ml  Net           498.17 ml     Assessment:  41 y.o. female is here with spontaneous sma dissection  Plan: No further need for AC, subq heparin only for vte ppx Aspirin Diet as tolerates but counseled to be cautious   Lindsey Macias Matters, MD [facility] [place] Office: [phone] Pager: [phone]  09/26/2017 12:43 PM

## 2017-09-27 LAB — CBC
HEMATOCRIT: 33.2 % — AB (ref 36.0–46.0)
HEMOGLOBIN: 11.1 g/dL — AB (ref 12.0–15.0)
MCH: 30.7 pg (ref 26.0–34.0)
MCHC: 33.4 g/dL (ref 30.0–36.0)
MCV: 91.7 fL (ref 78.0–100.0)
Platelets: 155 10*3/uL (ref 150–400)
RBC: 3.62 MIL/uL — ABNORMAL LOW (ref 3.87–5.11)
RDW: 12.7 % (ref 11.5–15.5)
WBC: 4.6 10*3/uL (ref 4.0–10.5)

## 2017-09-27 NOTE — Progress Notes (Signed)
  Progress Note    09/27/2017 8:23 AM * No surgery found *  Subjective:  Feeling better  Vitals:   09/26/17 2053 09/27/17 0553  BP: 117/74 100/64  Pulse: 73 73  Resp: 18 18  Temp: 98.3 F (36.8 C) 98.6 F (37 C)  SpO2: 100% 99%    Physical Exam: aaox3 Non labored respirations Abdomen is soft and non tender Palpable pedal pulses  CBC    Component Value Date/Time   WBC 4.6 09/27/2017 0436   RBC 3.62 (L) 09/27/2017 0436   HGB 11.1 (L) 09/27/2017 0436   HCT 33.2 (L) 09/27/2017 0436   PLT 155 09/27/2017 0436   MCV 91.7 09/27/2017 0436   MCH 30.7 09/27/2017 0436   MCHC 33.4 09/27/2017 0436   RDW 12.7 09/27/2017 0436   LYMPHSABS 2.4 05/17/2017 1543   MONOABS 0.5 05/17/2017 1543   EOSABS 0.1 05/17/2017 1543   BASOSABS 0.1 05/17/2017 1543    BMET    Component Value Date/Time   NA 137 09/26/2017 0502   K 3.9 09/26/2017 0502   CL 111 09/26/2017 0502   CO2 20 (L) 09/26/2017 0502   GLUCOSE 92 09/26/2017 0502   BUN <5 (L) 09/26/2017 0502   CREATININE 0.89 09/26/2017 1453   CALCIUM 8.1 (L) 09/26/2017 0502   GFRNONAA >60 09/26/2017 1453   GFRAA >60 09/26/2017 1453    INR No results found for: INR   Intake/Output Summary (Last 24 hours) at 09/27/17 0823 Last data filed at 09/27/17 0500  Gross per 24 hour  Intake             3740 ml  Output                0 ml  Net             3740 ml     Assessment:  41 y.o. female is here with spontaneous sma dissection  Plan: Continue asa Diet as tolerates   Ardean Melroy C. Donzetta Matters, MD [facility] [place] Office: [phone] Pager: [phone]  09/27/2017 8:23 AM

## 2017-09-28 ENCOUNTER — Telehealth: Payer: Self-pay | Admitting: Vascular Surgery

## 2017-09-28 ENCOUNTER — Other Ambulatory Visit: Payer: Self-pay

## 2017-09-28 DIAGNOSIS — K551 Chronic vascular disorders of intestine: Secondary | ICD-10-CM

## 2017-09-28 MED ORDER — ASPIRIN 81 MG PO TBEC: 81.0000 mg | DELAYED_RELEASE_TABLET | Freq: Every day | ORAL | Status: DC

## 2017-09-28 NOTE — Discharge Summary (Signed)
Discharge Summary    MAILYN STEICHEN 1976/10/20 41 y.o. female  [id number]  Admission Date: 09/25/2017  Discharge Date: 09/28/17  Physician: Thomes Lolling*  Admission Diagnosis: Dissection of mesenteric artery [facility]) [I77.79]   HPI:   This is a 41 y.o. female  without significant past medical history presented early this morning with periumbilical pain that radiated to her back. She also has some epigastric pain. She has no chest pain associated. She denies nausea or vomiting or diarrhea. She has not had any blood in her stool ever. She does have a history of hemorrhoids that are not bothering her at this time. She has never had this issue before. She denies any hypertension or cardiovascular diseases does not have any family history of either. She recently quit smoking 1 month ago. She does not take blood thinners.  Hospital Course:  The patient was admitted to the hospital and placed on heparin gtt and placed on bowel rest with IVF.  She was also started on an aspirin.   She was also ordered clear liquid diet and tolerated this well.    On HD 2, her abdomen was feeling better.  There is no further plan for anticoagulation except aspirin.  Her diet was advanced cautiously.  On HD 3, she was feeling better.  She is discharged home on a daily aspirin.   The remainder of the hospital course consisted of increasing mobilization and increasing intake of solids without difficulty.  CBC    Component Value Date/Time   WBC 4.6 09/27/2017 0436   RBC 3.62 (L) 09/27/2017 0436   HGB 11.1 (L) 09/27/2017 0436   HCT 33.2 (L) 09/27/2017 0436   PLT 155 09/27/2017 0436   MCV 91.7 09/27/2017 0436   MCH 30.7 09/27/2017 0436   MCHC 33.4 09/27/2017 0436   RDW 12.7 09/27/2017 0436   LYMPHSABS 2.4 05/17/2017 1543   MONOABS 0.5 05/17/2017 1543   EOSABS 0.1 05/17/2017 1543   BASOSABS 0.1 05/17/2017 1543    BMET    Component Value Date/Time   NA 137 09/26/2017 0502   K 3.9  09/26/2017 0502   CL 111 09/26/2017 0502   CO2 20 (L) 09/26/2017 0502   GLUCOSE 92 09/26/2017 0502   BUN <5 (L) 09/26/2017 0502   CREATININE 0.89 09/26/2017 1453   CALCIUM 8.1 (L) 09/26/2017 0502   GFRNONAA >60 09/26/2017 1453   GFRAA >60 09/26/2017 1453      Discharge Instructions    ABDOMINAL PROCEDURE/ANEURYSM REPAIR/AORTO-BIFEMORAL BYPASS:  Call MD for increased abdominal pain; cramping diarrhea; nausea/vomiting    Complete by:  As directed    Call MD for:  redness, tenderness, or signs of infection (pain, swelling, bleeding, redness, odor or green/yellow discharge around incision site)    Complete by:  As directed    Call MD for:  severe or increased pain, loss or decreased feeling  in affected limb(s)    Complete by:  As directed    Call MD for:  temperature >100.5    Complete by:  As directed    Driving Restrictions    Complete by:  As directed    No driving for 24 hours   Lifting restrictions    Complete by:  As directed    No lifting for 2 weeks   Resume previous diet    Complete by:  As directed       Discharge Diagnosis:  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 Allergy   . Cancer [facility]) 2003   cervical cancer 2002 and 2003. had leep procedure.  Marland Kitchen GERD (gastroesophageal reflux disease)    but actually had h pylori and took meds then got better.     Allergies as of 09/28/2017      Reactions   Vicodin [hydrocodone-acetaminophen] Photosensitivity   Dust Mite Extract    Sneezing,Watery eyes,Runny nose   Sulfa Antibiotics    Upset stomach      Medication List    TAKE these medications   aspirin 81 MG EC tablet Take 1 tablet (81 mg total) by mouth daily.   azelastine 0.05 % ophthalmic solution Commonly known as:  OPTIVAR Place 1 drop into both eyes 2 (two) times daily.     azelastine 0.1 % nasal spray Commonly known as:  ASTELIN Place 1 spray into both nostrils 2 (two) times daily.   cetirizine 10 MG tablet Commonly known as:  ZYRTEC Take 10 mg by mouth daily.   esomeprazole 20 MG capsule Commonly known as:  NEXIUM Take 20 mg by mouth once.   mometasone 50 MCG/ACT nasal spray Commonly known as:  NASONEX Place 1 spray into the nose at bedtime.       Prescriptions given: Aspirin 62m OTC  Instructions: 1.  No driving for 24 hours  2.  No heavy lifting x 2 weeks  Disposition: home  Patient's condition: is Good  Follow up: 1. Dr. CDonzetta Mattersin 4-6 weeks with CTA abdomen/pelvis   SLeontine Locket PA-C [facility] [phone] 346310/16/2018  10:36 AM

## 2017-09-28 NOTE — Progress Notes (Signed)
  Progress Note    09/28/2017 10:54 AM * No surgery found *  Subjective:  Feeling better, tolerating diet  Vitals:   09/27/17 2030 09/28/17 0538  BP: 106/67 98/63  Pulse: 67 61  Resp: 18 18  Temp: 98.7 F (37.1 C) 97.9 F (36.6 C)  SpO2: 99% 99%    Physical Exam: aaox3 Abdomen is soft Palpable pedal pulses  CBC    Component Value Date/Time   WBC 4.6 09/27/2017 0436   RBC 3.62 (L) 09/27/2017 0436   HGB 11.1 (L) 09/27/2017 0436   HCT 33.2 (L) 09/27/2017 0436   PLT 155 09/27/2017 0436   MCV 91.7 09/27/2017 0436   MCH 30.7 09/27/2017 0436   MCHC 33.4 09/27/2017 0436   RDW 12.7 09/27/2017 0436   LYMPHSABS 2.4 05/17/2017 1543   MONOABS 0.5 05/17/2017 1543   EOSABS 0.1 05/17/2017 1543   BASOSABS 0.1 05/17/2017 1543    BMET    Component Value Date/Time   NA 137 09/26/2017 0502   K 3.9 09/26/2017 0502   CL 111 09/26/2017 0502   CO2 20 (L) 09/26/2017 0502   GLUCOSE 92 09/26/2017 0502   BUN <5 (L) 09/26/2017 0502   CREATININE 0.89 09/26/2017 1453   CALCIUM 8.1 (L) 09/26/2017 0502   GFRNONAA >60 09/26/2017 1453   GFRAA >60 09/26/2017 1453    INR No results found for: INR   Intake/Output Summary (Last 24 hours) at 09/28/17 1054 Last data filed at 09/28/17 0929  Gross per 24 hour  Intake             1160 ml  Output                0 ml  Net             1160 ml     Assessment:  41 y.o. female is here with spontaneous sma dissection, tolerating regular diet  Plan: Ok for discharge. We have reviewed the ct and discussed signs and symptoms to be evaluated. She can discharge today and plan to see her in 4-6 weeks with CTA.   Janesa Dockery C. Donzetta Matters, MD [facility] [place] Office: [phone] Pager: [phone]  09/28/2017 10:54 AM

## 2017-09-28 NOTE — Telephone Encounter (Signed)
Sched appts 10/29/17. CTA AT 12:30 at [place]. MD at 1:30. Lm on hm#.

## 2017-09-28 NOTE — Progress Notes (Signed)
Discharge home. Home discharge instruction given, no question verbalized. 

## 2017-09-28 NOTE — Telephone Encounter (Signed)
-----   Message from Mena Goes, RN sent at 09/28/2017 11:00 AM EDT ----- Regarding: 4-6 weeks with CTA    ----- Message ----- From: Gabriel Earing, PA-C Sent: 09/28/2017  10:35 AM To: V[facility] Charge Pool  This pt needs to f/u with Dr. Donzetta Matters in 4-6 weeks with CTA of abdomen and pelvis for sma dissection.  Thanks, Aldona Bar

## 2017-10-29 ENCOUNTER — Ambulatory Visit: Payer: BLUE CROSS/BLUE SHIELD | Admitting: Vascular Surgery

## 2017-10-29 ENCOUNTER — Other Ambulatory Visit: Payer: Self-pay

## 2017-10-29 ENCOUNTER — Ambulatory Visit
Admission: RE | Admit: 2017-10-29 | Discharge: 2017-10-29 | Disposition: A | Discharge status: Home / Self Care | Payer: BLUE CROSS/BLUE SHIELD | Source: Ambulatory Visit | Attending: Vascular Surgery | Admitting: Vascular Surgery

## 2017-10-29 ENCOUNTER — Encounter: Payer: Self-pay | Admitting: Vascular Surgery

## 2017-10-29 VITALS — BP 105/65 | HR 72 | Temp 97.1°F | Resp 14 | Ht 63.0 in | Wt 144.0 lb

## 2017-10-29 DIAGNOSIS — I771 Stricture of artery: Secondary | ICD-10-CM | POA: Diagnosis not present

## 2017-10-29 DIAGNOSIS — N83201 Unspecified ovarian cyst, right side: Secondary | ICD-10-CM | POA: Diagnosis not present

## 2017-10-29 DIAGNOSIS — K551 Chronic vascular disorders of intestine: Secondary | ICD-10-CM

## 2017-10-29 MED ORDER — IOPAMIDOL (ISOVUE-370) INJECTION 76%
75.0000 mL | Freq: Once | INTRAVENOUS | Status: AC | PRN
  Administered 2017-10-29: 75 mL via INTRAVENOUS

## 2017-10-29 NOTE — Progress Notes (Signed)
Patient ID: Lindsey Macias, female   DOB: 1976/10/01, 41 y.o.   MRN: [id number]  Reason for Consult: New Patient (Initial Visit) (CT prior)   Referred by Mackie Pai, PA-C  Subjective:     HPI:  Lindsey Macias is a 41 y.o. female recently hospitalized with spontaneous SMA dissection.  At the time she was having significant abdominal pain after a few days she is tolerating regular diet.  We will place her on a heparin drip that was DC'd on and she left the hospital on aspirin.  She has had a couple episodes of intermittent abdominal pain but is otherwise eating and having normal bowel function and does not have complaints related to today's visit.  She remains on aspirin as prescribed upon leaving.  CT was performed prior to today's visit.  Past Medical History:  Diagnosis Date  . Allergy   . Cancer [facility]) 2003   cervical cancer 2002 and 2003. had leep procedure.  Marland Kitchen GERD (gastroesophageal reflux disease)    but actually had h pylori and took meds then got better.   Family History  Problem Relation Age of Onset  . Cancer Father        Brain   Past Surgical History:  Procedure Laterality Date  . Bunions surgery Right 2010 and 2012  . SHOULDER SURGERY Right 2012  . TONSILLECTOMY Bilateral 2012    Short Social History:  Social History   Tobacco Use  . Smoking status: Current Some Day Smoker    Packs/day: 0.00    Types: Cigarettes    Start date: 11/23/1994  . Smokeless tobacco: Never Used  Substance Use Topics  . Alcohol use: Yes    Comment: Pt states she drinks 2-3 drinks per year.    Allergies  Allergen Reactions  . Vicodin [Hydrocodone-Acetaminophen] Photosensitivity  . Dust Mite Extract     Sneezing,Watery eyes,Runny nose  . Sulfa Antibiotics     Upset stomach    Current Outpatient Medications  Medication Sig Dispense Refill  . aspirin EC 81 MG EC tablet Take 1 tablet (81 mg total) by mouth daily.    Marland Kitchen azelastine (ASTELIN) 0.1 % nasal spray Place 1 spray  into both nostrils 2 (two) times daily.  3  . azelastine (OPTIVAR) 0.05 % ophthalmic solution Place 1 drop into both eyes 2 (two) times daily.    . cetirizine (ZYRTEC) 10 MG tablet Take 10 mg by mouth daily.    Marland Kitchen esomeprazole (NEXIUM) 20 MG capsule Take 20 mg by mouth once.    . mometasone (NASONEX) 50 MCG/ACT nasal spray Place 1 spray into the nose at bedtime.   6  . Multiple Vitamins-Minerals (ONE-A-DAY WOMENS PETITES PO) Take daily by mouth.     No current facility-administered medications for this visit.     Review of Systems  Constitutional:  Constitutional negative. HENT: HENT negative.  Eyes: Eyes negative.  Respiratory: Respiratory negative.  GI: Gastrointestinal negative.  Musculoskeletal: Musculoskeletal negative.  Skin: Skin negative.  Neurological: Neurological negative. Hematologic: Hematologic/lymphatic negative.  Psychiatric: Psychiatric negative.        Objective:  Objective   Vitals:   10/29/17 1329  BP: 105/65  Pulse: 72  Resp: 14  Temp: (!) 97.1 F (36.2 C)  TempSrc: Oral  SpO2: 100%  Weight: 144 lb (65.3 kg)  Height: 5' 3"  (1.6 m)   Body mass index is 25.51 kg/m.  Physical Exam  Constitutional: She is oriented to person, place, and time. She appears well-developed.  HENT:  Head: Normocephalic.  Eyes: Pupils are equal, round, and reactive to light.  Neck: Normal range of motion.  Cardiovascular: Normal rate.  Pulses:      Dorsalis pedis pulses are 2+ on the right side, and 2+ on the left side.       Posterior tibial pulses are 2+ on the right side, and 2+ on the left side.  Pulmonary/Chest: Effort normal.  Abdominal: Soft. She exhibits no mass.  Musculoskeletal: Normal range of motion. She exhibits no edema.  Neurological: She is alert and oriented to person, place, and time.  Skin: Skin is warm and dry.  Psychiatric: She has a normal mood and affect. Her behavior is normal. Judgment and thought content normal.     Data: IMPRESSION: VASCULAR  1. Slight progression of SMA short segment dissection with probable thrombosed false lumen. 2. No new vascular abnormality is evident.  NON-VASCULAR  1. Stable 1.3 cm right middle lobe pulmonary nodule. Consider CT chest for complete pulmonary evaluation.      Assessment/Plan:     41 year old female follows up from SMA dissection that was spontaneous.  On CT scan she has trace thrombosis of her false lumen has a similar size at just over 1 cm as she did well in the.  She is asymptomatic at this time.  We reviewed the CT scan together in axial, coronal, sagittal views.  She does demonstrate good understanding.  We will have her follow-up with a mesenteric duplex in 6 months.  Should she have issues before that we will certainly see her sooner.  She will continue aspirin.  If there are issues with mesenteric duplex in the future we will consider repeating CT scan.     Waynetta Sandy MD [facility]

## 2017-11-01 NOTE — Addendum Note (Signed)
Addended by: Lianne Cure A on: 11/01/2017 04:32 PM   Modules accepted: Orders

## 2017-11-08 DIAGNOSIS — H57051 Tonic pupil, right eye: Secondary | ICD-10-CM | POA: Diagnosis not present

## 2017-12-02 MED FILL — MOMETASONE FUROATE 50 MCG S: 50 | 30 days supply | Qty: 17 | Fill #2

## 2017-12-02 MED FILL — AZELASTINE 0.1% (137 MCG) S: 0.1 | 50 days supply | Qty: 30 | Fill #2

## 2017-12-09 ENCOUNTER — Ambulatory Visit: Payer: BLUE CROSS/BLUE SHIELD | Admitting: Family Medicine

## 2017-12-09 ENCOUNTER — Encounter: Payer: Self-pay | Admitting: Family Medicine

## 2017-12-09 VITALS — BP 115/78 | HR 76 | Temp 98.4°F | Resp 16 | Ht 63.0 in | Wt 151.0 lb

## 2017-12-09 DIAGNOSIS — J0100 Acute maxillary sinusitis, unspecified: Secondary | ICD-10-CM

## 2017-12-09 MED ORDER — FLUCONAZOLE 150 MG PO TABS: 150.0000 mg | ORAL_TABLET | Freq: Once | ORAL | 0 refills | Status: AC

## 2017-12-09 MED ORDER — AMOXICILLIN-POT CLAVULANATE 875-125 MG PO TABS: 1.0000 | ORAL_TABLET | Freq: Two times a day (BID) | ORAL | 0 refills | Status: DC

## 2017-12-09 MED ORDER — AZELASTINE HCL 0.05 % OP SOLN: 1.0000 [drp] | Freq: Two times a day (BID) | OPHTHALMIC | 11 refills | Status: DC

## 2017-12-09 MED FILL — AZELASTINE HCL 0.05% DROPS: 0.05 | 30 days supply | Qty: 6 | Fill #0

## 2017-12-09 MED FILL — AMOX-CLAV 875-125 MG TABLET: 875-125 | 10 days supply | Qty: 20 | Fill #0

## 2017-12-09 MED FILL — FLUCONAZOLE 150 MG TABLET: 150 | 2 days supply | Qty: 2 | Fill #0

## 2017-12-09 NOTE — Progress Notes (Signed)
Chief Complaint  Patient presents with  . Sinus Problem    Complains of possible sinus infection, nasal pain since yesterday.    Lindsey Macias here for URI complaints.  Duration: 3 days  Associated symptoms: sinus congestion, sinus pain and rhinorrhea Denies: ear pain/drainage, itchy/watery eyes, ST, cough, SOB, fevers/rigors Treatment to date: nasal sprays Sick contacts: Yes - co workers  ROS:  Const: Denies fevers HEENT: As noted in HPI Lungs: No SOB  Past Medical History:  Diagnosis Date  . Allergy   . Cancer [facility]) 2003   cervical cancer 2002 and 2003. had leep procedure.  Marland Kitchen GERD (gastroesophageal reflux disease)    but actually had h pylori and took meds then got better.   Family History  Problem Relation Age of Onset  . Cancer Father        Brain    BP 115/78 (BP Location: Left Arm, Patient Position: Sitting, Cuff Size: Small)   Pulse 76   Temp 98.4 F (36.9 C) (Oral)   Resp 16   Ht 5' 3"  (1.6 m)   Wt 151 lb (68.5 kg)   SpO2 100%   BMI 26.75 kg/m  General: Awake, alert, appears stated age HEENT: AT, [place], ears patent b/l and TM's neg, +max TTP, nares patent w/o discharge, pharynx pink and without exudates, MMM Neck: No masses or asymmetry Heart: RRR, no murmurs, no bruits Lungs: CTAB, no accessory muscle use Psych: Age appropriate judgment and insight, normal mood and affect  Acute maxillary sinusitis, recurrence not specified - Plan: fluconazole (DIFLUCAN) 150 MG tablet, amoxicillin-clavulanate (AUGMENTIN) 875-125 MG tablet  Orders as above. Pocket rx and instructions given as most cases of sinusitis are viral.  Continue to push fluids, practice good hand hygiene, cover mouth when coughing. F/u prn. If starting to experience fevers, shaking, or shortness of breath, seek immediate care. Pt voiced understanding and agreement to the plan.  [place], DO 12/09/17 10:36 AM

## 2017-12-09 NOTE — Patient Instructions (Signed)
Most sinus infections are viral in etiology and antibiotics will not be helpful. That being said, if you start having worsening symptoms over 3 days, you are worsening by day 10 or not improving by day 14, go ahead and take it. You are on Day 3 as of now.   Continue to push fluids, practice good hand hygiene, and cover your mouth if you cough.  If you start having fevers, shaking or shortness of breath, seek immediate care.  Let us know if you need anything.

## 2017-12-20 ENCOUNTER — Encounter: Payer: Self-pay | Admitting: Medical

## 2017-12-20 ENCOUNTER — Ambulatory Visit: Payer: BLUE CROSS/BLUE SHIELD | Admitting: Medical

## 2017-12-20 VITALS — HR 72 | Temp 98.2°F | Resp 16 | Ht 63.0 in | Wt 150.4 lb

## 2017-12-20 DIAGNOSIS — L719 Rosacea, unspecified: Secondary | ICD-10-CM

## 2017-12-20 DIAGNOSIS — R635 Abnormal weight gain: Secondary | ICD-10-CM | POA: Diagnosis not present

## 2017-12-20 DIAGNOSIS — L709 Acne, unspecified: Secondary | ICD-10-CM

## 2017-12-20 DIAGNOSIS — R5383 Other fatigue: Secondary | ICD-10-CM

## 2017-12-20 DIAGNOSIS — K219 Gastro-esophageal reflux disease without esophagitis: Secondary | ICD-10-CM | POA: Diagnosis not present

## 2017-12-20 MED ORDER — DOXYCYCLINE HYCLATE 100 MG PO TABS: 100.0000 mg | ORAL_TABLET | Freq: Two times a day (BID) | ORAL | 0 refills | Status: DC

## 2017-12-20 MED ORDER — ESOMEPRAZOLE MAGNESIUM 20 MG PO CPDR: DELAYED_RELEASE_CAPSULE | ORAL | 1 refills | Status: DC

## 2017-12-20 MED ORDER — ESOMEPRAZOLE MAGNESIUM 20 MG PO CPDR: 20.0000 mg | DELAYED_RELEASE_CAPSULE | Freq: Once | ORAL | 0 refills | Status: DC

## 2017-12-20 MED FILL — DOXYCYCLINE HYC DR 100 MG T: 100 | 10 days supply | Qty: 20 | Fill #0

## 2017-12-20 MED FILL — ESOMEPRAZOLE MAGNESIUM 20 M: 20 | 90 days supply | Qty: 90 | Fill #0

## 2017-12-20 NOTE — Progress Notes (Signed)
Subjective:    Patient ID: Lindsey Macias, female    DOB: [birth year], 42 y.o.   MRN: [id number]  HPI  Pt in for some recent weight gain after she quit smoking.   She was to loose weight that she gained. Pt gained 17 lbs since last year.   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 No recent abdomen pain. Not having any severe abdomen like pain before.   Pt had some reflux since early November and belching easier. She wants refill of nexium but wants 20 mg dose.  Skin rash with out break. Black heads and pimples. Pt went on line and got cream treatment of clindamycin tretinonin and azeliaic acid. Used at night. Used for one wek and helped but cost $50 dollars. She states last couple of months face more red appearance. Presently one are on rt side cheek appears acne like.   Review of Systems  Constitutional: Positive for fatigue and unexpected weight change. Negative for chills and fever.  Respiratory: Negative for cough, chest tightness, shortness of breath and wheezing.   Cardiovascular: Negative for chest pain and palpitations.  Gastrointestinal: Negative for abdominal pain, blood in stool and constipation.  Skin:       Acne, rash, pimples.  Neurological: Negative for dizziness, syncope, speech difficulty, weakness, light-headedness, numbness and headaches.  Hematological: Negative for adenopathy. Does not bruise/bleed easily.  Psychiatric/Behavioral: Negative for behavioral problems and confusion.   Past Medical History:  Diagnosis Date  . Allergy   . Cancer [facility]) 2003   cervical cancer 2002 and 2003. had leep procedure.  Marland Kitchen GERD (gastroesophageal reflux disease)    but actually had h pylori and took meds then got better.     Social History   Socioeconomic History  . Marital status: Divorced    Spouse name: Not on file  . Number of children: Not on file  . Years of education: Not on file  . Highest education level: Not on file  Social Needs  .  Financial resource strain: Not on file  . Food insecurity - worry: Not on file  . Food insecurity - inability: Not on file  . Transportation needs - medical: Not on file  . Transportation needs - non-medical: Not on file  Occupational History  . Not on file  Tobacco Use  . Smoking status: Current Some Day Smoker    Packs/day: 0.00    Types: Cigarettes    Start date: 11/23/1994  . Smokeless tobacco: Never Used  Substance and Sexual Activity  . Alcohol use: Yes    Comment: Pt states she drinks 2-3 drinks per year.  . Drug use: No  . Sexual activity: Not on file  Other Topics Concern  . Not on file  Social History Narrative  . Not on file    Past Surgical History:  Procedure Laterality Date  . Bunions surgery Right 2010 and 2012  . SHOULDER SURGERY Right 2012  . TONSILLECTOMY Bilateral 2012    Family History  Problem Relation Age of Onset  . Cancer Father        Brain    Allergies  Allergen Reactions  . Vicodin [Hydrocodone-Acetaminophen] Photosensitivity  . Dust Mite Extract     Sneezing,Watery eyes,Runny nose  . Sulfa Antibiotics     Upset stomach    Current Outpatient Medications on File Prior to Visit  Medication Sig Dispense Refill  . amoxicillin-clavulanate (AUGMENTIN) 875-125 MG tablet Take 1 tablet by mouth 2 (two) times  daily. 20 tablet 0  . aspirin EC 81 MG EC tablet Take 1 tablet (81 mg total) by mouth daily.    Marland Kitchen azelastine (ASTELIN) 0.1 % nasal spray Place 1 spray into both nostrils 2 (two) times daily.  3  . azelastine (OPTIVAR) 0.05 % ophthalmic solution Place 1 drop into both eyes 2 (two) times daily. 6 mL 11  . cetirizine (ZYRTEC) 10 MG tablet Take 10 mg by mouth daily.    . mometasone (NASONEX) 50 MCG/ACT nasal spray Place 1 spray into the nose at bedtime.   6  . Multiple Vitamins-Minerals (ONE-A-DAY WOMENS PETITES PO) Take daily by mouth.     No current facility-administered medications on file prior to visit.     Pulse 72   Temp 98.2 F  (36.8 C) (Oral)   Resp 16   Ht 5' 3"  (1.6 m)   Wt 150 lb 6.4 oz (68.2 kg)   SpO2 100%   BMI 26.64 kg/m       Objective:   Physical Exam  General Mental Status- Alert. General Appearance- Not in acute distress.   Skin General: Color- mild reddish appearance to face. Rt side cheek one area looks acne like. mid  Neck Carotid Arteries- Normal color. Moisture- Normal Moisture. No carotid bruits. No JVD.  Chest and Lung Exam Auscultation: Breath Sounds:-Normal.  Cardiovascular Auscultation:Rythm- Regular. Murmurs & Other Heart Sounds:Auscultation of the heart reveals- No Murmurs.  Abdomen Inspection:-Inspeection Normal. Palpation/Percussion:Note:No mass. Palpation and Percussion of the abdomen reveal- Non Tender, Non Distended + BS, no rebound or guarding.    Neurologic Cranial Nerve exam:- CN III-XII intact(No nystagmus), symmetric smile. Strength:- 5/5 equal and symmetric strength both upper and lower extremities.      Assessment & Plan:  For your recent weight gain and mild fatigue, will order CBC, CMP and TSH.  If labs are normal consider prescription medication such as Belviq to help you lose weight along with healthy diet.  For recent acne and described redness to your face, I think doxycycline would be a good short-term option and then use your curology cream. If your face is not significantly improved by 7-10 days then let me know and we could refer you to a dermatologist.  An equivalent cream in our pharmacy would be very expensive.  It is possible curology cream was compounded.  For reflux, refilling your Nexium at lower dose.  While on doxycycline please make sure that you eat before taking a tablet.  Follow-up in 2 weeks or as needed.  Riker Collier, Percell Miller, PA-C

## 2017-12-20 NOTE — Patient Instructions (Addendum)
For your recent weight gain and mild fatigue, will order CBC, CMP and TSH.  If labs are normal consider prescription medication such as Belviq to help you lose weight along with healthy diet.  For recent acne and described redness to your face, I think doxycycline would be a good short-term option and then use your curology cream. If your face is not significantly improved by 7-10 days then let me know and we could refer you to a dermatologist.  An equivalent cream in our pharmacy would be very expensive.  It is possible curology cream was compounded.  For reflux, refilling your Nexium at lower dose.  While on doxycycline please make sure that you eat before taking a tablet.  Follow-up in 2 weeks or as needed.

## 2017-12-21 LAB — COMPREHENSIVE METABOLIC PANEL
ALT: 19 U/L (ref 0–35)
AST: 18 U/L (ref 0–37)
Albumin: 4.6 g/dL (ref 3.5–5.2)
Alkaline Phosphatase: 70 U/L (ref 39–117)
BUN: 12 mg/dL (ref 6–23)
CHLORIDE: 105 meq/L (ref 96–112)
CO2: 28 mEq/L (ref 19–32)
CREATININE: 0.93 mg/dL (ref 0.40–1.20)
Calcium: 9.3 mg/dL (ref 8.4–10.5)
GFR: 69.89 mL/min (ref >60.00)
GLUCOSE: 87 mg/dL (ref 70–99)
Potassium: 4.3 mEq/L (ref 3.5–5.1)
SODIUM: 140 meq/L (ref 135–145)
TOTAL PROTEIN: 7 g/dL (ref 6.0–8.3)
Total Bilirubin: 0.4 mg/dL (ref 0.2–1.2)

## 2017-12-21 LAB — CBC WITH DIFFERENTIAL/PLATELET
BASOS PCT: 1 % (ref 0.0–3.0)
Basophils Absolute: 0.1 10*3/uL (ref 0.0–0.1)
EOS ABS: 0.1 10*3/uL (ref 0.0–0.7)
Eosinophils Relative: 1.2 % (ref 0.0–5.0)
HCT: 42.6 % (ref 36.0–46.0)
HEMOGLOBIN: 14.2 g/dL (ref 12.0–15.0)
LYMPHS ABS: 2.4 10*3/uL (ref 0.7–4.0)
Lymphocytes Relative: 36.2 % (ref 12.0–46.0)
MCHC: 33.4 g/dL (ref 30.0–36.0)
MCV: 94.4 fl (ref 78.0–100.0)
MONO ABS: 0.4 10*3/uL (ref 0.1–1.0)
Monocytes Relative: 5.4 % (ref 3.0–12.0)
NEUTROS PCT: 56.2 % (ref 43.0–77.0)
Neutro Abs: 3.7 10*3/uL (ref 1.4–7.7)
Platelets: 248 10*3/uL (ref 150.0–400.0)
RBC: 4.51 Mil/uL (ref 3.87–5.11)
RDW: 12.9 % (ref 11.5–15.5)
WBC: 6.6 10*3/uL (ref 4.0–10.5)

## 2017-12-21 LAB — T4, FREE: FREE T4: 0.77 ng/dL (ref 0.60–1.60)

## 2017-12-21 LAB — TSH: TSH: 4.29 u[IU]/mL (ref 0.35–4.50)

## 2017-12-22 ENCOUNTER — Telehealth: Payer: Self-pay | Admitting: Medical

## 2017-12-22 MED ORDER — LORCASERIN HCL 10 MG PO TABS: ORAL_TABLET | ORAL | 1 refills | Status: DC

## 2017-12-22 NOTE — Telephone Encounter (Signed)
I tried to print patient a prescription of Belviq.  Would you see if it printed.  If it did not could you reprint the prescription so I can sign it.  And also please discontinue the previous prescription if it did not.  I sent the prescription from home after hours.  I have concerned that printer is not working as there was issues with the printer before I left  Thanks

## 2017-12-23 ENCOUNTER — Telehealth: Payer: Self-pay

## 2017-12-23 DIAGNOSIS — E663 Overweight: Secondary | ICD-10-CM

## 2017-12-23 NOTE — Telephone Encounter (Signed)
Rx faxed to pharmacy  

## 2017-12-23 NOTE — Telephone Encounter (Signed)
PA initiated via Covermymeds; KEY: [id number]. Awaiting determination.

## 2017-12-27 NOTE — Telephone Encounter (Signed)
PA denied, awaiting denial letter.

## 2017-12-28 ENCOUNTER — Telehealth: Payer: Self-pay | Admitting: Medical

## 2017-12-28 NOTE — Telephone Encounter (Signed)
Patient was normal weight about 13 months ago when she weighed 139.  Her BMI was approximately believe in 24 range when she weighed 139 pounds.  Now she weighs 150 and her BMI is just below 27.  She went from normal BMI to overweight.  She does not have any comorbidities.  So Belviq and other meds not likely to be approved by insurance.  At this point I put in a referral to see under her circumstances would Dr. Leafy Ro will be willing to see the patient.  Other options there are various weight loss specialty clinics that might be willing to prescribe her medications with a BMI of 27/technically overweight.  She can call around and I  would advise her to try to speak with them first and discuss her general health and BMI level to see if they prescribe medication.  I would advise this so she does not waste money attending the clinic and they tell her the same.

## 2017-12-28 NOTE — Telephone Encounter (Signed)
PA denied- Pt has BMI of 27 and has no other weight related co-morbidities (such as HTN, hyperlipidemia, DM type 2, etc..). For Pt's plan she must have BMI of 30 or greater and other weight related co-morbidities.

## 2018-01-06 ENCOUNTER — Telehealth: Payer: Self-pay

## 2018-01-06 NOTE — Telephone Encounter (Signed)
Copied from [place] [id number]. Topic: General - Other >> Jan 06, 2018 12:15 PM Boyd Kerbs wrote: Reason for CRM:   Genetta Fiero from office left message to call her back. Please call pt back  Called Pt back and informed her of Providers notes and suggestions. Pt states that she doesn't meet the require at any of the weight loss clinics she's contacted and states that she will try to lose weight on her own at this point.

## 2018-01-06 NOTE — Telephone Encounter (Signed)
Called Pt to inform of providers note and advise. Pt did not answer. Left VM to give the office a call back.

## 2018-01-11 MED FILL — MOMETASONE FUROATE 50 MCG S: 50 | 30 days supply | Qty: 17 | Fill #3

## 2018-01-11 MED FILL — AZELASTINE HCL 137 MCG SPRY: 0.1 | 50 days supply | Qty: 30 | Fill #3

## 2018-01-11 MED FILL — AZELASTINE HCL 0.05% DROPS: 0.05 | 30 days supply | Qty: 6 | Fill #1

## 2018-03-03 ENCOUNTER — Telehealth: Payer: Self-pay | Admitting: Medical

## 2018-03-03 MED ORDER — ELETRIPTAN HYDROBROMIDE 20 MG PO TABS: 20.0000 mg | ORAL_TABLET | ORAL | 0 refills | Status: DC | PRN

## 2018-03-03 NOTE — Telephone Encounter (Signed)
I just saw patients request for Relpax.  Appears  she called much earlier today.  On review of chart I do not see that I have treated her for migraines but neurologist has and she was seen in the emergency department in the past.  She has used Relpax before.  Let her know that I sent prescription to the med center.  Hopefully it will work for her headache.  If it does not work and her headache is severe then recommend ED evaluation.

## 2018-03-03 NOTE — Telephone Encounter (Signed)
Copied from [place] [phone]. Topic: Quick Communication - See Telephone Encounter >> Mar 03, 2018  9:38 AM Ivar Drape wrote: CRM for notification. See Telephone encounter for: 03/03/18. 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  She feels a migrane coming on now.  Please send to her preferred pharmacy  [place], [place] - [address] [phone] (Phone) [phone] (Fax)

## 2018-04-26 ENCOUNTER — Other Ambulatory Visit: Payer: Self-pay | Admitting: Medical

## 2018-04-26 MED FILL — ELETRIPTAN HBR 20 MG TABLET: 20 | 25 days supply | Qty: 10 | Fill #0

## 2018-04-26 MED FILL — AZELASTINE HCL 137 MCG/SPRA: 137 | 25 days supply | Qty: 30 | Fill #0

## 2018-04-26 MED FILL — MOMETASONE FUROATE 50 MCG S: 50 | 30 days supply | Qty: 17 | Fill #0

## 2018-05-06 ENCOUNTER — Ambulatory Visit: Payer: BLUE CROSS/BLUE SHIELD | Admitting: Vascular Surgery

## 2018-05-06 ENCOUNTER — Other Ambulatory Visit: Payer: Self-pay

## 2018-05-06 ENCOUNTER — Ambulatory Visit (HOSPITAL_COMMUNITY)
Admission: RE | Admit: 2018-05-06 | Discharge: 2018-05-06 | Disposition: A | Discharge status: Home / Self Care | Payer: BLUE CROSS/BLUE SHIELD | Source: Ambulatory Visit | Attending: Vascular Surgery | Admitting: Vascular Surgery

## 2018-05-06 ENCOUNTER — Encounter: Payer: Self-pay | Admitting: Vascular Surgery

## 2018-05-06 VITALS — BP 116/75 | HR 72 | Resp 18 | Ht 63.0 in | Wt 155.7 lb

## 2018-05-06 DIAGNOSIS — I771 Stricture of artery: Secondary | ICD-10-CM | POA: Diagnosis not present

## 2018-05-06 DIAGNOSIS — I7779 Dissection of other artery: Secondary | ICD-10-CM | POA: Insufficient documentation

## 2018-05-06 DIAGNOSIS — K551 Chronic vascular disorders of intestine: Secondary | ICD-10-CM

## 2018-05-06 NOTE — Progress Notes (Signed)
Patient ID: Lindsey Macias, female   DOB: 18-May-1976, 42 y.o.   MRN: [id number]  Reason for Consult: Follow-up (6 month f/u )   Referred by Mackie Pai, PA-C  Subjective:     HPI:  Lindsey Macias is a 42 y.o. 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  She returns today having had 2 episodes of severe central abdominal pain one that she drove herself to the emergency department but left when the pain subsided.  She continues to eat and has actually gained some weight because she quit smoking.  She continues to take aspirin.  She has not had any other issues and remains very active.  Past Medical History:  Diagnosis Date  . Allergy   . Cancer [facility]) 2003   cervical cancer 2002 and 2003. had leep procedure.  Marland Kitchen GERD (gastroesophageal reflux disease)    but actually had h pylori and took meds then got better.   Family History  Problem Relation Age of Onset  . Cancer Father        Brain   Past Surgical History:  Procedure Laterality Date  . Bunions surgery Right 2010 and 2012  . SHOULDER SURGERY Right 2012  . TONSILLECTOMY Bilateral 2012    Short Social History:  Social History   Tobacco Use  . Smoking status: Current Some Day Smoker    Packs/day: 0.00    Types: Cigarettes    Start date: 11/23/1994  . Smokeless tobacco: Never Used  Substance Use Topics  . Alcohol use: Yes    Comment: Pt states she drinks 2-3 drinks per year.    Allergies  Allergen Reactions  . Vicodin [Hydrocodone-Acetaminophen] Photosensitivity  . Dust Mite Extract     Sneezing,Watery eyes,Runny nose  . Sulfa Antibiotics     Upset stomach    Current Outpatient Medications  Medication Sig Dispense Refill  . aspirin EC 81 MG EC tablet Take 1 tablet (81 mg total) by mouth daily.    Marland Kitchen azelastine (ASTELIN) 0.1 % nasal spray PLACE 2 SPRAYS INTO BOTH NOSTRILS 2 (TWO) TIMES DAILY. 30 mL 3  . azelastine  (OPTIVAR) 0.05 % ophthalmic solution Place 1 drop into both eyes 2 (two) times daily. 6 mL 11  . cetirizine (ZYRTEC) 10 MG tablet Take 10 mg by mouth daily.    Marland Kitchen eletriptan (RELPAX) 20 MG tablet Take 1 tablet (20 mg total) by mouth as needed for migraine or headache. May repeat in 2 hours if headache persists or recurs. 10 tablet 0  . esomeprazole (NEXIUM) 20 MG capsule 1 tab po a day(generic ok) 90 capsule 1  . mometasone (NASONEX) 50 MCG/ACT nasal spray PLACE 2 SPRAYS INTO THE NOSE DAILY. 17 g 6  . Multiple Vitamins-Minerals (ONE-A-DAY WOMENS PETITES PO) Take daily by mouth.    Marland Kitchen amoxicillin-clavulanate (AUGMENTIN) 875-125 MG tablet Take 1 tablet by mouth 2 (two) times daily. (Patient not taking: Reported on 05/06/2018) 20 tablet 0  . doxycycline (VIBRA-TABS) 100 MG tablet Take 1 tablet (100 mg total) by mouth 2 (two) times daily. (Patient not taking: Reported on 05/06/2018) 20 tablet 0  . Lorcaserin HCl (BELVIQ) 10 MG TABS 1 tab po bid (Patient not taking: Reported on 05/06/2018) 60 tablet 1   No current facility-administered medications for this visit.     Review of Systems  Constitutional:  Constitutional negative. HENT: HENT negative.  Eyes: Eyes negative.  Respiratory: Respiratory negative.  Cardiovascular: Cardiovascular negative.  GI: Positive for abdominal pain.  Musculoskeletal: Musculoskeletal negative.  Skin: Skin negative.  Neurological: Neurological negative. Hematologic: Hematologic/lymphatic negative.  Psychiatric: Psychiatric negative.        Objective:  Objective   There were no vitals filed for this visit. There is no height or weight on file to calculate BMI.  Physical Exam  Constitutional: She is oriented to person, place, and time. She appears well-developed.  HENT:  Head: Normocephalic.  Eyes: Pupils are equal, round, and reactive to light.  Neck: Normal range of motion.  Cardiovascular: Normal rate.  Pulses:      Carotid pulses are 2+ on the right side,  and 2+ on the left side.      Radial pulses are 2+ on the right side, and 2+ on the left side.       Dorsalis pedis pulses are 2+ on the right side, and 2+ on the left side.  Abdominal: Soft. She exhibits no distension. There is no tenderness.  Musculoskeletal: Normal range of motion.  Neurological: She is alert and oriented to person, place, and time.  Skin: Skin is warm and dry.  Psychiatric: She has a normal mood and affect. Her behavior is normal. Thought content normal.    Data: I have independently interpreted her abdominal duplex which demonstrates an SMA origin velocity of 265 cm/s.     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  She has had 2 episodes of abdominal pain but these spontaneously resolved and she continues to eat without any pain.  If she has further issues she can follow-up by calling otherwise we will see her in 1 year with repeat duplex.  If she does have further issues I would get a repeat CT Angie of her abdomen and pelvis as well.  She demonstrates good understanding we will continue aspirin until her next follow-up.    Waynetta Sandy MD [facility]

## 2018-07-19 ENCOUNTER — Encounter: Payer: Self-pay | Admitting: Vascular Surgery

## 2018-09-21 DIAGNOSIS — Z86010 Personal history of colonic polyps: Secondary | ICD-10-CM | POA: Diagnosis not present

## 2018-09-29 DIAGNOSIS — Z86010 Personal history of colonic polyps: Secondary | ICD-10-CM | POA: Diagnosis not present

## 2018-09-29 DIAGNOSIS — Z1211 Encounter for screening for malignant neoplasm of colon: Secondary | ICD-10-CM | POA: Diagnosis not present

## 2018-12-27 ENCOUNTER — Ambulatory Visit: Payer: BLUE CROSS/BLUE SHIELD | Admitting: Medical

## 2018-12-27 ENCOUNTER — Encounter: Payer: Self-pay | Admitting: Medical

## 2018-12-27 ENCOUNTER — Ambulatory Visit (HOSPITAL_BASED_OUTPATIENT_CLINIC_OR_DEPARTMENT_OTHER)
Admission: RE | Admit: 2018-12-27 | Discharge: 2018-12-27 | Disposition: A | Discharge status: Home / Self Care | Payer: BLUE CROSS/BLUE SHIELD | Source: Ambulatory Visit | Attending: Medical | Admitting: Medical

## 2018-12-27 VITALS — BP 103/70 | HR 107 | Temp 98.2°F | Resp 16 | Ht 63.0 in | Wt 165.2 lb

## 2018-12-27 DIAGNOSIS — M545 Low back pain, unspecified: Secondary | ICD-10-CM

## 2018-12-27 DIAGNOSIS — J3489 Other specified disorders of nose and nasal sinuses: Secondary | ICD-10-CM

## 2018-12-27 DIAGNOSIS — J029 Acute pharyngitis, unspecified: Secondary | ICD-10-CM | POA: Diagnosis not present

## 2018-12-27 MED ORDER — CYCLOBENZAPRINE HCL 10 MG PO TABS: 10.0000 mg | ORAL_TABLET | Freq: Every day | ORAL | 0 refills | Status: DC

## 2018-12-27 MED ORDER — FLUTICASONE PROPIONATE 50 MCG/ACT NA SUSP: 2.0000 | Freq: Every day | NASAL | 1 refills | Status: DC

## 2018-12-27 MED ORDER — DICLOFENAC SODIUM 75 MG PO TBEC: 75.0000 mg | DELAYED_RELEASE_TABLET | Freq: Two times a day (BID) | ORAL | 0 refills | Status: DC

## 2018-12-27 MED ORDER — AZITHROMYCIN 250 MG PO TABS: ORAL_TABLET | ORAL | 0 refills | Status: DC

## 2018-12-27 MED FILL — DICLOFENAC SODIUM 75 MG TAB: 75 | 10 days supply | Qty: 20 | Fill #0

## 2018-12-27 MED FILL — FLUTICASONE PROP 50 MCG SPR: 50 | 30 days supply | Qty: 16 | Fill #0

## 2018-12-27 MED FILL — CYCLOBENZAPRINE HCL 10 MG T: 10 | 10 days supply | Qty: 10 | Fill #0

## 2018-12-27 MED FILL — AZITHROMYCIN 250 MG TABLET: 250 | 5 days supply | Qty: 6 | Fill #0

## 2018-12-27 NOTE — Patient Instructions (Signed)
You had moderate to severe sore throat recently.  Overall clinical presentation is suspicious for possible strep.  Your rapid strep test done at your work was negative yesterday.  I will send in a azithromycin 5-day prescription antibiotic.  For nasal congestion, as prescribed Flonase.  You mention that your work sent out flu test.  If that test comes back positive please let me know.  You do have 1 month of lower back pain.  We will get a lumbar spine x-ray today.  I am prescribing diclofenac NSAID for the pain and Flexeril muscle relaxant.  You can start to do back stretching exercises as tolerated once your back pain eases up.  If you get worse back pain with radiating features please let us know.  If back pain persists more than another 5 to 7 days then will consider sports medicine referral.  Follow-up in 7 to 10 days or as needed.   Back Exercises The following exercises strengthen the muscles that help to support the back. They also help to keep the lower back flexible. Doing these exercises can help to prevent back pain or lessen existing pain. If you have back pain or discomfort, try doing these exercises 2-3 times each day or as told by your health care provider. When the pain goes away, do them once each day, but increase the number of times that you repeat the steps for each exercise (do more repetitions). If you do not have back pain or discomfort, do these exercises once each day or as told by your health care provider. Exercises Single Knee to Chest Repeat these steps 3-5 times for each leg: 1. Lie on your back on a firm bed or the floor with your legs extended. 2. Bring one knee to your chest. Your other leg should stay extended and in contact with the floor. 3. Hold your knee in place by grabbing your knee or thigh. 4. Pull on your knee until you feel a gentle stretch in your lower back. 5. Hold the stretch for 10-30 seconds. 6. Slowly release and straighten your leg. Pelvic  Tilt Repeat these steps 5-10 times: 1. Lie on your back on a firm bed or the floor with your legs extended. 2. Bend your knees so they are pointing toward the ceiling and your feet are flat on the floor. 3. Tighten your lower abdominal muscles to press your lower back against the floor. This motion will tilt your pelvis so your tailbone points up toward the ceiling instead of pointing to your feet or the floor. 4. With gentle tension and even breathing, hold this position for 5-10 seconds. Cat-Cow Repeat these steps until your lower back becomes more flexible: 1. Get into a hands-and-knees position on a firm surface. Keep your hands under your shoulders, and keep your knees under your hips. You may place padding under your knees for comfort. 2. Let your head hang down, and point your tailbone toward the floor so your lower back becomes rounded like the back of a cat. 3. Hold this position for 5 seconds. 4. Slowly lift your head and point your tailbone up toward the ceiling so your back forms a sagging arch like the back of a cow. 5. Hold this position for 5 seconds.  Press-Ups Repeat these steps 5-10 times: 1. Lie on your abdomen (face-down) on the floor. 2. Place your palms near your head, about shoulder-width apart. 3. While you keep your back as relaxed as possible and keep your hips on  the floor, slowly straighten your arms to raise the top half of your body and lift your shoulders. Do not use your back muscles to raise your upper torso. You may adjust the placement of your hands to make yourself more comfortable. 4. Hold this position for 5 seconds while you keep your back relaxed. 5. Slowly return to lying flat on the floor.  Bridges Repeat these steps 10 times: 1. Lie on your back on a firm surface. 2. Bend your knees so they are pointing toward the ceiling and your feet are flat on the floor. 3. Tighten your buttocks muscles and lift your buttocks off of the floor until your waist  is at almost the same height as your knees. You should feel the muscles working in your buttocks and the back of your thighs. If you do not feel these muscles, slide your feet 1-2 inches farther away from your buttocks. 4. Hold this position for 3-5 seconds. 5. Slowly lower your hips to the starting position, and allow your buttocks muscles to relax completely. If this exercise is too easy, try doing it with your arms crossed over your chest. Abdominal Crunches Repeat these steps 5-10 times: 1. Lie on your back on a firm bed or the floor with your legs extended. 2. Bend your knees so they are pointing toward the ceiling and your feet are flat on the floor. 3. Cross your arms over your chest. 4. Tip your chin slightly toward your chest without bending your neck. 5. Tighten your abdominal muscles and slowly raise your trunk (torso) high enough to lift your shoulder blades a tiny bit off of the floor. Avoid raising your torso higher than that, because it can put too much stress on your low back and it does not help to strengthen your abdominal muscles. 6. Slowly return to your starting position. Back Lifts Repeat these steps 5-10 times: 1. Lie on your abdomen (face-down) with your arms at your sides, and rest your forehead on the floor. 2. Tighten the muscles in your legs and your buttocks. 3. Slowly lift your chest off of the floor while you keep your hips pressed to the floor. Keep the back of your head in line with the curve in your back. Your eyes should be looking at the floor. 4. Hold this position for 3-5 seconds. 5. Slowly return to your starting position. Contact a health care provider if:  Your back pain or discomfort gets much worse when you do an exercise.  Your back pain or discomfort does not lessen within 2 hours after you exercise. If you have any of these problems, stop doing these exercises right away. Do not do them again unless your health care provider says that you  can. Get help right away if:  You develop sudden, severe back pain. If this happens, stop doing the exercises right away. Do not do them again unless your health care provider says that you can. This information is not intended to replace advice given to you by your health care provider. Make sure you discuss any questions you have with your health care provider. Document Released: 01/07/2005 Document Revised: 04/05/2018 Document Reviewed: 01/24/2015 Elsevier Interactive Patient Education  [employer].

## 2018-12-27 NOTE — Progress Notes (Signed)
Subjective:    Patient ID: Lindsey Macias, female    DOB: 1976/11/10, 43 y.o.   MRN: [id number]  HPI  Pt in for evaluation.  Pt states she has been sick with sinus pains/problems in the past. In summer got sinus infection. Pt states yesterday she got up and got st. States a lot of pain yesterday on swallowing. Rapid strep test came back negative. Today pain level is moderate. No body aches. Pt tickle in throat yesterday. Pt had negative rapid strep was negative. Pt states flu test was sent out and will be back in 72 hours.  Pt also states some mid lower back pain for about one month. Last 2 days pain is a lot worse. No cva area pain. No uti symptoms. Pain feels worse when she lays flat on her back. When standing and going to sit position extreme pain. Bending over hurts. Has not taken medication for pain. Pt has seen chiropracter and it did not help.  lmp- mirena.   Review of Systems  Constitutional: Negative for chills, fatigue and fever.  HENT: Positive for congestion, sinus pressure, sinus pain and sore throat. Negative for ear pain.   Respiratory: Negative for cough, chest tightness and wheezing.   Cardiovascular: Negative for chest pain and palpitations.  Gastrointestinal: Negative for abdominal pain, constipation, diarrhea and nausea.  Genitourinary: Negative for difficulty urinating, enuresis, flank pain, genital sores, hematuria and urgency.  Musculoskeletal: Positive for back pain.  Skin: Negative for rash.  Neurological: Negative for dizziness, facial asymmetry, light-headedness and headaches.       No radiating pain to legs.  Hematological: Negative for adenopathy. Does not bruise/bleed easily.  Psychiatric/Behavioral: Negative for behavioral problems and confusion.    Past Medical History:  Diagnosis Date  . Allergy   . Cancer [facility]) 2003   cervical cancer 2002 and 2003. had leep procedure.  Marland Kitchen GERD (gastroesophageal reflux disease)    but actually had h pylori and  took meds then got better.     Social History   Socioeconomic History  . Marital status: Divorced    Spouse name: Not on file  . Number of children: Not on file  . Years of education: Not on file  . Highest education level: Not on file  Occupational History  . Not on file  Social Needs  . Financial resource strain: Not on file  . Food insecurity:    Worry: Not on file    Inability: Not on file  . Transportation needs:    Medical: Not on file    Non-medical: Not on file  Tobacco Use  . Smoking status: Current Some Day Smoker    Packs/day: 0.00    Types: Cigarettes    Start date: 11/23/1994  . Smokeless tobacco: Never Used  Substance and Sexual Activity  . Alcohol use: Yes    Comment: Pt states she drinks 2-3 drinks per year.  . Drug use: No  . Sexual activity: Not on file  Lifestyle  . Physical activity:    Days per week: Not on file    Minutes per session: Not on file  . Stress: Not on file  Relationships  . Social connections:    Talks on phone: Not on file    Gets together: Not on file    Attends religious service: Not on file    Active member of club or organization: Not on file    Attends meetings of clubs or organizations: Not on file  Relationship status: Not on file  . Intimate partner violence:    Fear of current or ex partner: Not on file    Emotionally abused: Not on file    Physically abused: Not on file    Forced sexual activity: Not on file  Other Topics Concern  . Not on file  Social History Narrative  . Not on file    Past Surgical History:  Procedure Laterality Date  . Bunions surgery Right 2010 and 2012  . SHOULDER SURGERY Right 2012  . TONSILLECTOMY Bilateral 2012    Family History  Problem Relation Age of Onset  . Cancer Father        Brain    Allergies  Allergen Reactions  . Vicodin [Hydrocodone-Acetaminophen] Photosensitivity  . Dust Mite Extract     Sneezing,Watery eyes,Runny nose  . Sulfa Antibiotics     Upset stomach     Current Outpatient Medications on File Prior to Visit  Medication Sig Dispense Refill  . aspirin EC 81 MG EC tablet Take 1 tablet (81 mg total) by mouth daily.    Marland Kitchen azelastine (ASTELIN) 0.1 % nasal spray PLACE 2 SPRAYS INTO BOTH NOSTRILS 2 (TWO) TIMES DAILY. 30 mL 3  . azelastine (OPTIVAR) 0.05 % ophthalmic solution Place 1 drop into both eyes 2 (two) times daily. 6 mL 11  . cetirizine (ZYRTEC) 10 MG tablet Take 10 mg by mouth daily.    Marland Kitchen eletriptan (RELPAX) 20 MG tablet Take 1 tablet (20 mg total) by mouth as needed for migraine or headache. May repeat in 2 hours if headache persists or recurs. 10 tablet 0  . esomeprazole (NEXIUM) 20 MG capsule 1 tab po a day(generic ok) 90 capsule 1  . Lorcaserin HCl (BELVIQ) 10 MG TABS 1 tab po bid 60 tablet 1  . mometasone (NASONEX) 50 MCG/ACT nasal spray PLACE 2 SPRAYS INTO THE NOSE DAILY. 17 g 6   No current facility-administered medications on file prior to visit.     BP 103/70   Pulse (!) 107   Temp 98.2 F (36.8 C) (Oral)   Resp 16   Ht 5' 3"  (1.6 m)   Wt 165 lb 3.2 oz (74.9 kg)   SpO2 99%   BMI 29.26 kg/m       Objective:   Physical Exam  General  Mental Status - Alert. General Appearance - Well groomed. Not in acute distress.  Skin Rashes- No Rashes.  HEENT Head- Normal. Ear Auditory Canal - Left- Normal. Right - Normal.Tympanic Membrane- Left- Normal. Right- Normal. Eye Sclera/Conjunctiva- Left- Normal. Right- Normal. Nose & Sinuses Nasal Mucosa- Left-  Boggy and Congested. Right-  Boggy and  Congested.Bilateral faint  maxillary but no frontal sinus pressure. Mouth & Throat Lips: Upper Lip- Normal: no dryness, cracking, pallor, cyanosis, or vesicular eruption. Lower Lip-Normal: no dryness, cracking, pallor, cyanosis or vesicular eruption. Buccal Mucosa- Bilateral- No Aphthous ulcers. Oropharynx- No Discharge or Erythema. Tonsils: Characteristics- Bilateral- mild-moderate Erythema. Size/Enlargement- Bilateral- No  enlargement. Discharge- bilateral-None.  Neck Neck- Supple. No Masses. Mild shoddy submandibular nodes.   Chest and Lung Exam Auscultation: Breath Sounds:-Clear even and unlabored.  Cardiovascular Auscultation:Rythm- Regular, rate and rhythm. Murmurs & Other Heart Sounds:Ausculatation of the heart reveal- No Murmurs.  Lymphatic Head & Neck General Head & Neck Lymphatics: Bilateral: Description- No Localized lymphadenopathy.   Abdomen Inspection:-Inspection Normal.  Palpation/Perucssion: Palpation and Percussion of the abdomen reveal- Non Tender, No Rebound tenderness, No rigidity(Guarding) and No Palpable abdominal masses.  Liver:-Normal.  Spleen:- Normal.  Back Mid lower  lumbar spine tenderness to palpation. Pain on straight leg lift left side. Pain on lateral movements and flexion/extension of the spine. (pain with position changes)  Lower ext neurologic  L5-S1 sensation intact bilaterally. Normal patellar reflexes bilaterally. No foot drop bilaterally.      Assessment & Plan:  You had moderate to severe sore throat recently.  Overall clinical presentation is suspicious for possible strep.  Your rapid strep test done at your work was negative yesterday.  I will send in a azithromycin 5-day prescription antibiotic.  For nasal congestion, as prescribed Flonase.  You mention that your work sent out flu test.  If that test comes back positive please let me know.  You do have 1 month of lower back pain.  We will get a lumbar spine x-ray today.  I am prescribing diclofenac NSAID for the pain and Flexeril muscle relaxant.  You can start to do back stretching exercises as tolerated once your back pain eases up.  If you get worse back pain with radiating features please let us know.  If back pain persists more than another 5 to 7 days then will consider sports medicine referral.  Follow-up in 7 to 10 days or as needed.  Mackie Pai, PA-C

## 2019-01-19 ENCOUNTER — Encounter: Payer: Self-pay | Admitting: Vascular Surgery

## 2019-01-19 ENCOUNTER — Encounter (HOSPITAL_COMMUNITY): Payer: Self-pay

## 2019-01-25 ENCOUNTER — Encounter: Payer: Self-pay | Admitting: Vascular Surgery

## 2019-01-25 ENCOUNTER — Encounter (HOSPITAL_COMMUNITY): Payer: Self-pay

## 2019-01-31 ENCOUNTER — Other Ambulatory Visit: Payer: Self-pay

## 2019-01-31 DIAGNOSIS — I83893 Varicose veins of bilateral lower extremities with other complications: Secondary | ICD-10-CM

## 2019-02-01 ENCOUNTER — Encounter: Payer: Self-pay | Admitting: Vascular Surgery

## 2019-02-01 ENCOUNTER — Ambulatory Visit: Payer: BLUE CROSS/BLUE SHIELD | Admitting: Vascular Surgery

## 2019-02-01 ENCOUNTER — Other Ambulatory Visit: Payer: Self-pay

## 2019-02-01 ENCOUNTER — Ambulatory Visit (HOSPITAL_COMMUNITY)
Admission: RE | Admit: 2019-02-01 | Discharge: 2019-02-01 | Disposition: A | Discharge status: Home / Self Care | Payer: BLUE CROSS/BLUE SHIELD | Source: Ambulatory Visit | Attending: Vascular Surgery | Admitting: Vascular Surgery

## 2019-02-01 VITALS — BP 108/69 | HR 78 | Temp 98.3°F | Resp 18 | Ht 62.5 in | Wt 165.2 lb

## 2019-02-01 DIAGNOSIS — I83813 Varicose veins of bilateral lower extremities with pain: Secondary | ICD-10-CM

## 2019-02-01 DIAGNOSIS — I83893 Varicose veins of bilateral lower extremities with other complications: Secondary | ICD-10-CM | POA: Diagnosis not present

## 2019-02-01 NOTE — Progress Notes (Signed)
Patient name: Lindsey Macias MRN: [id number] DOB: 05/26/1976 Sex: female  REASON FOR CONSULT: Bilateral leg pain with varicose veins  HPI: Lindsey Macias is a 43 y.o. female, who complains of bilateral leg pain.  She describes heaviness fullness and achiness in both legs especially after standing on her feet all day long.  He has not worn compression stockings in the past.  She has no history of DVT.  She does have a family history of varicose veins in her sister and mother.  Has had no prior lower extremity surgical procedures.     Past Medical History:  Diagnosis Date  . Allergy   . Cancer [facility]) 2003   cervical cancer 2002 and 2003. had leep procedure.  Marland Kitchen GERD (gastroesophageal reflux disease)    but actually had h pylori and took meds then got better.   Past Surgical History:  Procedure Laterality Date  . Bunions surgery Right 2010 and 2012  . SHOULDER SURGERY Right 2012  . TONSILLECTOMY Bilateral 2012    Family History  Problem Relation Age of Onset  . Cancer Father        Brain    SOCIAL HISTORY: Social History   Socioeconomic History  . Marital status: Divorced    Spouse name: Not on file  . Number of children: Not on file  . Years of education: Not on file  . Highest education level: Not on file  Occupational History  . Not on file  Social Needs  . Financial resource strain: Not on file  . Food insecurity:    Worry: Not on file    Inability: Not on file  . Transportation needs:    Medical: Not on file    Non-medical: Not on file  Tobacco Use  . Smoking status: Current Some Day Smoker    Packs/day: 0.00    Types: Cigarettes    Start date: 11/23/1994  . Smokeless tobacco: Never Used  Substance and Sexual Activity  . Alcohol use: Yes    Comment: Pt states she drinks 2-3 drinks per year.  . Drug use: No  . Sexual activity: Not on file  Lifestyle  . Physical activity:    Days per week: Not on file    Minutes per session: Not on file  . Stress: Not on  file  Relationships  . Social connections:    Talks on phone: Not on file    Gets together: Not on file    Attends religious service: Not on file    Active member of club or organization: Not on file    Attends meetings of clubs or organizations: Not on file    Relationship status: Not on file  . Intimate partner violence:    Fear of current or ex partner: Not on file    Emotionally abused: Not on file    Physically abused: Not on file    Forced sexual activity: Not on file  Other Topics Concern  . Not on file  Social History Narrative  . Not on file    Allergies  Allergen Reactions  . Vicodin [Hydrocodone-Acetaminophen] Photosensitivity  . Dust Mite Extract     Sneezing,Watery eyes,Runny nose  . Sulfa Antibiotics     Upset stomach    Current Outpatient Medications  Medication Sig Dispense Refill  . aspirin EC 81 MG EC tablet Take 1 tablet (81 mg total) by mouth daily.    Marland Kitchen azelastine (ASTELIN) 0.1 % nasal spray PLACE 2 SPRAYS INTO  BOTH NOSTRILS 2 (TWO) TIMES DAILY. 30 mL 3  . azelastine (OPTIVAR) 0.05 % ophthalmic solution Place 1 drop into both eyes 2 (two) times daily. 6 mL 11  . cetirizine (ZYRTEC) 10 MG tablet Take 10 mg by mouth daily.    Marland Kitchen eletriptan (RELPAX) 20 MG tablet Take 1 tablet (20 mg total) by mouth as needed for migraine or headache. May repeat in 2 hours if headache persists or recurs. 10 tablet 0  . esomeprazole (NEXIUM) 20 MG capsule 1 tab po a day(generic ok) 90 capsule 1  . mometasone (NASONEX) 50 MCG/ACT nasal spray PLACE 2 SPRAYS INTO THE NOSE DAILY. 17 g 6  . azithromycin (ZITHROMAX) 250 MG tablet Take 2 tablets by mouth on day 1, followed by 1 tablet by mouth daily for 4 days. (Patient not taking: Reported on 02/01/2019) 6 tablet 0  . cyclobenzaprine (FLEXERIL) 10 MG tablet Take 1 tablet (10 mg total) by mouth at bedtime. (Patient not taking: Reported on 02/01/2019) 10 tablet 0  . diclofenac (VOLTAREN) 75 MG EC tablet Take 1 tablet (75 mg total) by  mouth 2 (two) times daily. (Patient not taking: Reported on 02/01/2019) 20 tablet 0  . fluticasone (FLONASE) 50 MCG/ACT nasal spray Place 2 sprays into both nostrils daily. (Patient not taking: Reported on 02/01/2019) 16 g 1  . Lorcaserin HCl (BELVIQ) 10 MG TABS 1 tab po bid (Patient not taking: Reported on 02/01/2019) 60 tablet 1   No current facility-administered medications for this visit.     ROS:   General:  No weight loss, Fever, chills  Cardiac: No recent episodes of chest pain/pressure, no shortness of breath at rest.  No shortness of breath with exertion.  Denies history of atrial fibrillation or irregular heartbeat  Vascular: No history of rest pain in feet.  No history of claudication.  No history of non-healing ulcer, No history of DVT   Pulmonary: No home oxygen, no productive cough, no hemoptysis,  No asthma or wheezing    Physical Examination  Vitals:   02/01/19 1340  BP: 108/69  Pulse: 78  Resp: 18  Temp: 98.3 F (36.8 C)  TempSrc: Oral  SpO2: 99%  Weight: 165 lb 3.2 oz (74.9 kg)  Height: 5' 2.5" (1.588 m)    Body mass index is 29.73 kg/m.  General:  Alert and oriented, no acute distress HEENT: Normal Neck: No JVD Pulmonary: Clear to auscultation bilaterally Cardiac: Regular Rate and Rhythm Skin: No rash, scattered spider type varicosities left medial ankle right medial thigh right medial ankle Extremity Pulses:  2+ radial, brachial, femoral, dorsalis pedis, posterior tibial pulses bilaterally Musculoskeletal: No deformity or edema  Neurologic: Upper and lower extremity motor 5/5 and symmetric  DATA:  Patient had a venous duplex today for reflux.  In the right lower extremity she did have reflux in the right greater saphenous vein with a 4 to 7 mm diameter.  She also had dilation of the left greater saphenous vein 4 to 7 mm but no evidence of reflux.  I repeated portions of her exam with the SonoSite today in the right leg.  Again confirming that the  vein diameter was about 4 to 7 mm.  ASSESSMENT: Symptomatic varicose veins with pain.  Although the left greater saphenous was slightly dilated she does not have reflux on the side.  I have prescribed pression stockings for the left leg for symptomatic relief.  In the right lower extremity she did have evidence of reflux in the right greater  saphenous vein with a dilated vein of 4 to 7 mm.  I believe she would benefit from laser ablation of her right greater saphenous vein to improve her pain symptoms.  Overall if she is CEAP class I I merrily with symptoms of pain and aching.   PLAN: Patient was given a prescription today for lower extremity compression stockings thigh length.  She was also told to elevate her legs daily to improve her symptoms.  The patient will follow-up with me in 3 months time for consideration of laser ablation of her right greater saphenous vein.   Ruta Hinds, MD [facility] Office: [phone] Pager: [phone]

## 2019-05-03 ENCOUNTER — Ambulatory Visit: Payer: Self-pay | Admitting: Vascular Surgery

## 2019-05-15 ENCOUNTER — Other Ambulatory Visit: Payer: Self-pay

## 2019-05-15 DIAGNOSIS — K551 Chronic vascular disorders of intestine: Secondary | ICD-10-CM

## 2019-05-18 ENCOUNTER — Telehealth (HOSPITAL_COMMUNITY): Payer: Self-pay | Admitting: Rehabilitation

## 2019-05-18 NOTE — Telephone Encounter (Signed)

## 2019-05-19 ENCOUNTER — Encounter: Payer: Self-pay | Admitting: Vascular Surgery

## 2019-05-19 ENCOUNTER — Ambulatory Visit (INDEPENDENT_AMBULATORY_CARE_PROVIDER_SITE_OTHER): Payer: BC Managed Care – PPO | Admitting: Vascular Surgery

## 2019-05-19 ENCOUNTER — Ambulatory Visit (HOSPITAL_COMMUNITY)
Admission: RE | Admit: 2019-05-19 | Discharge: 2019-05-19 | Disposition: A | Discharge status: Home / Self Care | Payer: BC Managed Care – PPO | Source: Ambulatory Visit | Attending: Vascular Surgery | Admitting: Vascular Surgery

## 2019-05-19 ENCOUNTER — Other Ambulatory Visit: Payer: Self-pay

## 2019-05-19 VITALS — BP 118/77 | HR 80 | Resp 20 | Ht 62.0 in | Wt 164.0 lb

## 2019-05-19 DIAGNOSIS — K551 Chronic vascular disorders of intestine: Secondary | ICD-10-CM

## 2019-05-19 DIAGNOSIS — I771 Stricture of artery: Secondary | ICD-10-CM | POA: Diagnosis not present

## 2019-05-19 NOTE — Progress Notes (Signed)
Patient ID: Lindsey Macias, female   DOB: Apr 27, 1976, 43 y.o.   MRN: [id number]  Reason for Consult: Follow-up   Referred by Mackie Pai, PA-C  Subjective:     HPI:  Lindsey Macias is a 43 y.o. female was previously admitted with spontaneous SMA dissection.  I last saw her 1 year ago with mesenteric duplex.  She has gained 3 pounds since that time.  She has no further abdominal pain.  She does not smoke any longer.  She does take aspirin daily.  She has not been as active given coronavirus and need for increased work hours.  Overall she is doing very well.  She has been seen for her varicose veins she is wearing compression stockings daily and is planning to have a visit with Dr. Oneida Alar virtually in July.  Past Medical History:  Diagnosis Date  . Allergy   . Cancer [facility]) 2003   cervical cancer 2002 and 2003. had leep procedure.  Marland Kitchen GERD (gastroesophageal reflux disease)    but actually had h pylori and took meds then got better.   Family History  Problem Relation Age of Onset  . Cancer Father        Brain   Past Surgical History:  Procedure Laterality Date  . Bunions surgery Right 2010 and 2012  . SHOULDER SURGERY Right 2012  . TONSILLECTOMY Bilateral 2012    Short Social History:  Social History   Tobacco Use  . Smoking status: Current Some Day Smoker    Packs/day: 0.00    Types: Cigarettes    Start date: 11/23/1994  . Smokeless tobacco: Never Used  Substance Use Topics  . Alcohol use: Yes    Comment: Pt states she drinks 2-3 drinks per year.    Allergies  Allergen Reactions  . Vicodin [Hydrocodone-Acetaminophen] Photosensitivity  . Dust Mite Extract     Sneezing,Watery eyes,Runny nose  . Sulfa Antibiotics     Upset stomach    Current Outpatient Medications  Medication Sig Dispense Refill  . aspirin EC 81 MG EC tablet Take 1 tablet (81 mg total) by mouth daily.    Marland Kitchen azelastine (ASTELIN) 0.1 % nasal spray PLACE 2 SPRAYS INTO BOTH NOSTRILS 2 (TWO) TIMES  DAILY. 30 mL 3  . azelastine (OPTIVAR) 0.05 % ophthalmic solution Place 1 drop into both eyes 2 (two) times daily. 6 mL 11  . cetirizine (ZYRTEC) 10 MG tablet Take 10 mg by mouth daily.    Marland Kitchen eletriptan (RELPAX) 20 MG tablet Take 1 tablet (20 mg total) by mouth as needed for migraine or headache. May repeat in 2 hours if headache persists or recurs. 10 tablet 0  . esomeprazole (NEXIUM) 20 MG capsule 1 tab po a day(generic ok) 90 capsule 1  . mometasone (NASONEX) 50 MCG/ACT nasal spray PLACE 2 SPRAYS INTO THE NOSE DAILY. 17 g 6   No current facility-administered medications for this visit.     Review of Systems  Constitutional: Positive for unexpected weight change.  HENT: HENT negative.  Eyes: Eyes negative.  Respiratory: Respiratory negative.  Cardiovascular: Positive for leg swelling.  GI: Gastrointestinal negative.  Musculoskeletal: Musculoskeletal negative.  Skin: Skin negative.  Neurological: Neurological negative. Hematologic: Hematologic/lymphatic negative.  Psychiatric: Psychiatric negative.        Objective:  Objective   Vitals:   05/19/19 0829  BP: 118/77  Pulse: 80  Resp: 20  SpO2: 97%  Weight: 164 lb (74.4 kg)  Height: 5' 2"  (1.575 m)   Body  mass index is 30 kg/m.  Physical Exam Constitutional:      Appearance: Normal appearance. She is normal weight.  HENT:     Head: Normocephalic.  Eyes:     Pupils: Pupils are equal, round, and reactive to light.  Cardiovascular:     Rate and Rhythm: Normal rate.     Pulses:          Carotid pulses are 2+ on the right side and 2+ on the left side.      Radial pulses are 2+ on the right side and 2+ on the left side.       Dorsalis pedis pulses are 2+ on the right side and 2+ on the left side.       Posterior tibial pulses are 2+ on the right side and 2+ on the left side.  Pulmonary:     Effort: Pulmonary effort is normal.  Abdominal:     General: Abdomen is flat.     Palpations: Abdomen is soft. There is no mass.   Musculoskeletal: Normal range of motion.  Skin:    General: Skin is warm and dry.  Neurological:     General: No focal deficit present.     Mental Status: She is alert.  Psychiatric:        Mood and Affect: Mood normal.        Behavior: Behavior normal.        Thought Content: Thought content normal.     Data: I have independently interpreted her mesenteric duplex which demonstrates SMA velocity noted at 255 cm/s at the takeoff.  Otherwise normal exam.  Previously SMA velocity noted with 265 cm/s.  Grossly normal exam today without any notable dilatation of the vessel.      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  She has gained weight has no issues with eating.  She takes aspirin daily.  She is back to her normal level of activity.  Given the stability of her SMA over the past year and a half we will plan to follow her up in 2 years unless she has issues prior to that.  Is enteric duplex at the time of follow-up     Waynetta Sandy MD [facility]

## 2019-05-24 ENCOUNTER — Ambulatory Visit (INDEPENDENT_AMBULATORY_CARE_PROVIDER_SITE_OTHER): Payer: BC Managed Care – PPO | Admitting: Family Medicine

## 2019-05-24 ENCOUNTER — Encounter: Payer: Self-pay | Admitting: Family Medicine

## 2019-05-24 ENCOUNTER — Other Ambulatory Visit: Payer: Self-pay

## 2019-05-24 DIAGNOSIS — J0140 Acute pansinusitis, unspecified: Secondary | ICD-10-CM

## 2019-05-24 MED ORDER — FLUCONAZOLE 150 MG PO TABS: ORAL_TABLET | ORAL | 0 refills | Status: DC

## 2019-05-24 MED ORDER — AMOXICILLIN-POT CLAVULANATE 875-125 MG PO TABS: 1.0000 | ORAL_TABLET | Freq: Two times a day (BID) | ORAL | 0 refills | Status: DC

## 2019-05-24 NOTE — Progress Notes (Signed)
Virtual Visit via Video Note  I connected with Lindsey Macias on 05/24/19 at 11:00 AM EDT by a video enabled telemedicine application and verified that I am speaking with the correct person using two identifiers.  Location: Patient: office  Provider: home    I discussed the limitations of evaluation and management by telemedicine and the availability of in person appointments. The patient expressed understanding and agreed to proceed.  History of Present Illness: Pt is home c/o sinus congestion  She is taking tylenol sinus .  She is also taking her astelin, nasonex and xyrtec  She c/o sinus pressure/ headache She has a hx of sinus issues- this is typical for her    Current Outpatient Medications on File Prior to Visit  Medication Sig Dispense Refill  . aspirin EC 81 MG EC tablet Take 1 tablet (81 mg total) by mouth daily.    Marland Kitchen azelastine (ASTELIN) 0.1 % nasal spray PLACE 2 SPRAYS INTO BOTH NOSTRILS 2 (TWO) TIMES DAILY. 30 mL 3  . azelastine (OPTIVAR) 0.05 % ophthalmic solution Place 1 drop into both eyes 2 (two) times daily. 6 mL 11  . cetirizine (ZYRTEC) 10 MG tablet Take 10 mg by mouth daily.    Marland Kitchen eletriptan (RELPAX) 20 MG tablet Take 1 tablet (20 mg total) by mouth as needed for migraine or headache. May repeat in 2 hours if headache persists or recurs. 10 tablet 0  . esomeprazole (NEXIUM) 20 MG capsule 1 tab po a day(generic ok) 90 capsule 1  . mometasone (NASONEX) 50 MCG/ACT nasal spray PLACE 2 SPRAYS INTO THE NOSE DAILY. 17 g 6   No current facility-administered medications on file prior to visit.    Marland Kitchen Past Medical History:  Diagnosis Date  . Allergy   . Cancer [facility]) 2003   cervical cancer 2002 and 2003. had leep procedure.  Marland Kitchen GERD (gastroesophageal reflux disease)    but actually had h pylori and took meds then got better.    Observations/Objective: Afebrile No other vitals obtained Pt is in nad Pt c/o sinus pain / pressure over frontal sinuses and max  sinus  Assessment and Plan: 1. Acute pansinusitis, recurrence not specified abx per orders con't nasal sprays and antihistamine Call or rto prn  difulcan also sent for possible yeast from abx - amoxicillin-clavulanate (AUGMENTIN) 875-125 MG tablet; Take 1 tablet by mouth 2 (two) times daily.  Dispense: 20 tablet; Refill: 0 - fluconazole (DIFLUCAN) 150 MG tablet; 1 po x1, may repeat in 3 days prn  Dispense: 2 tablet; Refill: 0   Follow Up Instructions:    I discussed the assessment and treatment plan with the patient. The patient was provided an opportunity to ask questions and all were answered. The patient agreed with the plan and demonstrated an understanding of the instructions.   The patient was advised to call back or seek an in-person evaluation if the symptoms worsen or if the condition fails to improve as anticipated.  I provided 15 minutes of non-face-to-face time during this encounter.   Ann Held, DO

## 2019-06-20 ENCOUNTER — Telehealth: Payer: Self-pay | Admitting: *Deleted

## 2019-06-20 NOTE — Telephone Encounter (Signed)
Virtual Visit Pre-Appointment Phone Call  Today, I spoke with Lindsey Macias and performed the following actions:  1. I explained that we are currently trying to limit exposure to the COVID-19 virus by seeing patients at home rather than in the office.  I explained that the visits are best done by video, but can be done by telephone.  I asked the patient if a virtual visit that the patient would like to try instead of coming into the office. Lindsey Macias agreed to proceed with the virtual visit scheduled with Dr. Ruta Hinds on 06/21/19.      2. I confirmed the BEST phone number to call the day of the visit and- I included this in appointment notes.  3. I asked if the patient had access to (through a family member/friend) a smartphone with video capability to be used for her visit?"  The patient said yes -        4. I confirmed consent by  a. sending through [place] or by email the [place] as written at the end of this message or  b. verbally as listed below. i. This visit is being performed in the setting of COVID-19. ii. All virtual visits are billed to your insurance company just like a normal visit would be.   iii. We'd like you to understand that the technology does not allow for your provider to perform an examination, and thus may limit your provider's ability to fully assess your condition.  iv. If your provider identifies any concerns that need to be evaluated in person, we will make arrangements to do so.   v. Finally, though the technology is pretty good, we cannot assure that it will always work on either your or our end, and in the setting of a video visit, we may have to convert it to a phone-only visit.  In either situation, we cannot ensure that we have a secure connection.   vi. Are you willing to proceed?"  STAFF: Did the patient verbally acknowledge consent to telehealth visit? Document YES/NO here: YES  2. I advised the patient to  be prepared - I asked that the patient, on the day of her visit, record any information possible with the equipment at her home, such as blood pressure, pulse, oxygen saturation, and your weight and write them all down. I asked the patient to have a pen and paper handy nearby the day of the visit as well.  3. If the patient was scheduled for a video visit, I informed the patient that the visit with the doctor would start with a text to the smartphone # given to [name] by the patient.         If the patient was scheduled for a telephone call, I informed the patient that the visit with the doctor would start with a call to the telephone # given to [name] by the patient.  4. I Informed patient they will receive a phone call 15 minutes prior to their appointment time from a [place] or nurse to review medications, allergies, etc. to prepare for the visit.    TELEPHONE CALL NOTE  Lindsey Macias has been deemed a candidate for a follow-up tele-health visit to limit community exposure during the Covid-19 pandemic. I spoke with the patient via phone to ensure availability of phone/video source, confirm preferred email & phone number, and discuss instructions and expectations.  I reminded Lindsey Macias to be  prepared with any vital sign and/or heart rhythm information that could potentially be obtained via home monitoring, at the time of her visit. I reminded Lindsey Macias to expect a phone call prior to her visit.  Cleaster Corin, NT 06/20/2019 10:47 AM     FULL LENGTH CONSENT FOR TELE-HEALTH VISIT   I hereby voluntarily request, consent and authorize [facility] and its employed or contracted physicians, physician assistants, nurse practitioners or other licensed health care professionals (the Practitioner), to provide me with telemedicine health care services (the "Services") as deemed necessary by the treating Practitioner. I acknowledge and consent to receive the Services by the Practitioner via telemedicine.  I understand that the telemedicine visit will involve communicating with the Practitioner through live audiovisual communication technology and the disclosure of certain medical information by electronic transmission. I acknowledge that I have been given the opportunity to request an in-person assessment or other available alternative prior to the telemedicine visit and am voluntarily participating in the telemedicine visit.  I understand that I have the right to withhold or withdraw my consent to the use of telemedicine in the course of my care at any time, without affecting my right to future care or treatment, and that the Practitioner or I may terminate the telemedicine visit at any time. I understand that I have the right to inspect all information obtained and/or recorded in the course of the telemedicine visit and may receive copies of available information for a reasonable fee.  I understand that some of the potential risks of receiving the Services via telemedicine include:  Marland Kitchen Delay or interruption in medical evaluation due to technological equipment failure or disruption; . Information transmitted may not be sufficient (e.g. poor resolution of images) to allow for appropriate medical decision making by the Practitioner; and/or  . In rare instances, security protocols could fail, causing a breach of personal health information.  Furthermore, I acknowledge that it is my responsibility to provide information about my medical history, conditions and care that is complete and accurate to the best of my ability. I acknowledge that Practitioner's advice, recommendations, and/or decision may be based on factors not within their control, such as incomplete or inaccurate data provided by me or distortions of diagnostic images or specimens that may result from electronic transmissions. I understand that the practice of medicine is not an exact science and that Practitioner makes no warranties or guarantees  regarding treatment outcomes. I acknowledge that I will receive a copy of this consent concurrently upon execution via email to the email address I last provided but may also request a printed copy by calling the office of [place].    I understand that my insurance will be billed for this visit.   I have read or had this consent read to me. . I understand the contents of this consent, which adequately explains the benefits and risks of the Services being provided via telemedicine.  . I have been provided ample opportunity to ask questions regarding this consent and the Services and have had my questions answered to my satisfaction. . I give my informed consent for the services to be provided through the use of telemedicine in my medical care  By participating in this telemedicine visit I agree to the above.

## 2019-06-21 ENCOUNTER — Other Ambulatory Visit: Payer: Self-pay

## 2019-06-21 ENCOUNTER — Encounter: Payer: Self-pay | Admitting: Vascular Surgery

## 2019-06-21 ENCOUNTER — Ambulatory Visit (INDEPENDENT_AMBULATORY_CARE_PROVIDER_SITE_OTHER): Payer: BC Managed Care – PPO | Admitting: Vascular Surgery

## 2019-06-21 VITALS — BP 129/83 | HR 81 | Ht 62.0 in | Wt 160.2 lb

## 2019-06-21 DIAGNOSIS — I83813 Varicose veins of bilateral lower extremities with pain: Secondary | ICD-10-CM | POA: Diagnosis not present

## 2019-06-22 NOTE — Progress Notes (Signed)
    Virtual Visit via Telephone Note  I connected with Lindsey Macias on 06/22/2019 using the [employer] by telephone and verified that I was speaking with the correct person using two identifiers. Patient was located at work and accompanied by no one. I am located at our office at [facility].   The limitations of evaluation and management by telemedicine and the availability of in person appointments have been previously discussed with the patient and are documented in the patients chart. The patient expressed understanding and consented to proceed.  We tried to establish a video visit however, the audio was not working appropriately so we converted this to a telephone visit.  PCP: Mackie Pai, PA-C   Chief Complaint: Varicose veins History of Present Illness: Lindsey Macias is a 43 y.o. female with symptomatic varicose veins bilateral lower extremities with pain.  She describes heaviness fullness achiness in both legs especially after standing on her feet all day long.  She is wearing compression stockings.  She states this has not completely resolved her symptoms.  She also states that the heaviness and fullness is still bothersome to her.  She has not had any prior surgical procedures.  Past Medical History:  Diagnosis Date  . Allergy   . Cancer [facility]) 2003   cervical cancer 2002 and 2003. had leep procedure.  Marland Kitchen GERD (gastroesophageal reflux disease)    but actually had h pylori and took meds then got better.  . Varicose veins of both lower extremities     Past Surgical History:  Procedure Laterality Date  . Bunions surgery Right 2010 and 2012  . SHOULDER SURGERY Right 2012  . TONSILLECTOMY Bilateral 2012    Current Meds  Medication Sig  . aspirin EC 81 MG EC tablet Take 1 tablet (81 mg total) by mouth daily.  Marland Kitchen azelastine (ASTELIN) 0.1 % nasal spray PLACE 2 SPRAYS INTO BOTH NOSTRILS 2 (TWO) TIMES DAILY.  Marland Kitchen azelastine (OPTIVAR) 0.05 % ophthalmic solution Place 1 drop into both  eyes 2 (two) times daily.  . cetirizine (ZYRTEC) 10 MG tablet Take 10 mg by mouth daily.  Marland Kitchen esomeprazole (NEXIUM) 20 MG capsule 1 tab po a day(generic ok)  . mometasone (NASONEX) 50 MCG/ACT nasal spray PLACE 2 SPRAYS INTO THE NOSE DAILY.     Observations/Objective: Unable to obtain phone visit  Assessment and Plan: Symptomatic varicose veins with pain.  This has not been responsive to conservative measures with compression therapy.  Previous venous duplex showed reflux in the right lower extremity with a right greater saphenous vein of 4 to 7 mm and similar findings in the left leg but no reflux was noted in the left leg saphenous vein  Follow Up Instructions: I believe the patient would benefit from laser ablation of her right greater saphenous vein.  Try to get her scheduled for this procedure pending insurance approval.  If her left leg symptoms continue to worsen over time we could also consider repeating her left leg venous duplex to see if she has developed reflux.  Risk benefits possible complications of procedure details of laser ablation for the left leg were discussed with the patient today.  She understands and agrees to proceed.  I spent 10 minutes with the patient via telephone encounter.   Signed, Ruta Hinds [facility] Office: [phone]

## 2019-06-29 ENCOUNTER — Ambulatory Visit (INDEPENDENT_AMBULATORY_CARE_PROVIDER_SITE_OTHER): Payer: BC Managed Care – PPO | Admitting: Internal Medicine

## 2019-06-29 ENCOUNTER — Ambulatory Visit: Payer: BC Managed Care – PPO | Admitting: Medical

## 2019-06-29 ENCOUNTER — Other Ambulatory Visit: Payer: Self-pay

## 2019-06-29 ENCOUNTER — Other Ambulatory Visit: Payer: Self-pay | Admitting: *Deleted

## 2019-06-29 DIAGNOSIS — J0140 Acute pansinusitis, unspecified: Secondary | ICD-10-CM

## 2019-06-29 DIAGNOSIS — I83813 Varicose veins of bilateral lower extremities with pain: Secondary | ICD-10-CM

## 2019-06-29 DIAGNOSIS — J019 Acute sinusitis, unspecified: Secondary | ICD-10-CM

## 2019-06-29 MED ORDER — AZITHROMYCIN 250 MG PO TABS: ORAL_TABLET | ORAL | 0 refills | Status: DC

## 2019-06-29 MED ORDER — FLUCONAZOLE 150 MG PO TABS: ORAL_TABLET | ORAL | 0 refills | Status: DC

## 2019-06-29 NOTE — Progress Notes (Signed)
Subjective:    Patient ID: Lindsey Macias, female    DOB: 05-23-1976, 43 y.o.   MRN: [id number]  DOS:  06/29/2019 Type of visit - description: Virtual Visit via Video Note  I connected with@ on 06/30/19 at 11:20 AM EDT by a video enabled telemedicine application and verified that I am speaking with the correct person using two identifiers.   THIS ENCOUNTER IS A VIRTUAL VISIT DUE TO COVID-19 - PATIENT WAS NOT SEEN IN THE OFFICE. PATIENT HAS CONSENTED TO VIRTUAL VISIT / TELEMEDICINE VISIT   Location of patient: home  Location of provider: office  I discussed the limitations of evaluation and management by telemedicine and the availability of in person appointments. The patient expressed understanding and agreed to proceed.  History of Present Illness: Acute visit She has a history of chronic/recurrent sinusitis. This time symptoms started a week ago and they are much worse for the last 3 days: Frontal headache, facial fullness, pressure around both eyes.  Ear pressure. She takes her allergy medication nasal sprays regularly.    Review of Systems  Denies fevers, she check her temperature daily Some chills No nausea, vomiting, diarrhea No sore throat Denies nasal discharge which is typical for her even when she has a sinus infection.    Past Medical History:  Diagnosis Date  . Allergy   . Cancer [facility]) 2003   cervical cancer 2002 and 2003. had leep procedure.  Marland Kitchen GERD (gastroesophageal reflux disease)    but actually had h pylori and took meds then got better.  . Varicose veins of both lower extremities     Past Surgical History:  Procedure Laterality Date  . Bunions surgery Right 2010 and 2012  . SHOULDER SURGERY Right 2012  . TONSILLECTOMY Bilateral 2012    Social History   Socioeconomic History  . Marital status: Divorced    Spouse name: Not on file  . Number of children: Not on file  . Years of education: Not on file  . Highest education level: Not on file   Occupational History  . Not on file  Social Needs  . Financial resource strain: Not on file  . Food insecurity    Worry: Not on file    Inability: Not on file  . Transportation needs    Medical: Not on file    Non-medical: Not on file  Tobacco Use  . Smoking status: Current Some Day Smoker    Packs/day: 0.00    Types: Cigarettes    Start date: 11/23/1994  . Smokeless tobacco: Never Used  Substance and Sexual Activity  . Alcohol use: Yes    Comment: Pt states she drinks 2-3 drinks per year.  . Drug use: No  . Sexual activity: Not on file  Lifestyle  . Physical activity    Days per week: Not on file    Minutes per session: Not on file  . Stress: Not on file  Relationships  . Social Herbalist on phone: Not on file    Gets together: Not on file    Attends religious service: Not on file    Active member of club or organization: Not on file    Attends meetings of clubs or organizations: Not on file    Relationship status: Not on file  . Intimate partner violence    Fear of current or ex partner: Not on file    Emotionally abused: Not on file    Physically abused: Not on file  Forced sexual activity: Not on file  Other Topics Concern  . Not on file  Social History Narrative  . Not on file      Allergies as of 06/29/2019      Reactions   Vicodin [hydrocodone-acetaminophen] Photosensitivity   Dust Mite Extract    Sneezing,Watery eyes,Runny nose   Sulfa Antibiotics    Upset stomach      Medication List       Accurate as of June 29, 2019 11:59 PM. If you have any questions, ask your nurse or doctor.        STOP taking these medications   amoxicillin-clavulanate 875-125 MG tablet Commonly known as: Augmentin Stopped by: Kathlene November, MD     TAKE these medications   aspirin 81 MG EC tablet Take 1 tablet (81 mg total) by mouth daily.   azelastine 0.05 % ophthalmic solution Commonly known as: OPTIVAR Place 1 drop into both eyes 2 (two) times daily.    azelastine 0.1 % nasal spray Commonly known as: ASTELIN PLACE 2 SPRAYS INTO BOTH NOSTRILS 2 (TWO) TIMES DAILY.   azithromycin 250 MG tablet Commonly known as: Zithromax Z-Pak 2 tabs a day the first day, then 1 tab a day x 4 days Started by: Kathlene November, MD   cetirizine 10 MG tablet Commonly known as: ZYRTEC Take 10 mg by mouth daily.   eletriptan 20 MG tablet Commonly known as: RELPAX Take 1 tablet (20 mg total) by mouth as needed for migraine or headache. May repeat in 2 hours if headache persists or recurs.   esomeprazole 20 MG capsule Commonly known as: NEXIUM 1 tab po a day(generic ok)   fluconazole 150 MG tablet Commonly known as: DIFLUCAN 1 po x1, may repeat in 3 days prn   mometasone 50 MCG/ACT nasal spray Commonly known as: NASONEX PLACE 2 SPRAYS INTO THE NOSE DAILY.           Objective:   Physical Exam There were no vitals taken for this visit. Is a virtual video visit, she is alert oriented x3, no apparent distress. Face is symmetric, speech is normal.    Assessment    43 year old female, PMH includes a spontaneous SMA dissection, varicose veins, cervical cancer,Presents with:   Sinusitis: The patient has recurrent sinusitis, last episode was about 5 weeks ago, responded to Augmentin. Patient is aware of the limitations of a visit, I can't do a clinical exam so with this limited information I think is prudent to Rx antibiotics: Plan: Continue allergy medications and nasal sprays Z-Pak Diflucan x2 per patient request Robitussin or Mucinex. Call if not gradually better      I discussed the assessment and treatment plan with the patient. The patient was provided an opportunity to ask questions and all were answered. The patient agreed with the plan and demonstrated an understanding of the instructions.   The patient was advised to call back or seek an in-person evaluation if the symptoms worsen or if the condition fails to improve as anticipated.

## 2019-07-04 ENCOUNTER — Telehealth: Payer: Self-pay | Admitting: Medical

## 2019-07-04 NOTE — Telephone Encounter (Signed)
Pt is just getting her Rx for  azithromycin (ZITHROMAX Z-PAK) 250 MG tablet From the pharmacy and noticed it stated not to take with antacid. Pt takes esomeprazole (NEXIUM) 20 MG capsule Daily and wants to know what the doctor advised her to do as far as taking them / please advise

## 2019-07-04 NOTE — Telephone Encounter (Signed)
Please advise 

## 2019-07-04 NOTE — Telephone Encounter (Signed)
I review up to date, no interactions, recommend to take them 1 hour apart.

## 2019-07-04 NOTE — Telephone Encounter (Signed)
Spoke w/ Pt- informed of recommendations. Pt verbalized understanding.

## 2019-07-24 ENCOUNTER — Ambulatory Visit (INDEPENDENT_AMBULATORY_CARE_PROVIDER_SITE_OTHER): Payer: BC Managed Care – PPO | Admitting: Medical

## 2019-07-24 ENCOUNTER — Telehealth: Payer: Self-pay | Admitting: Medical

## 2019-07-24 ENCOUNTER — Other Ambulatory Visit: Payer: Self-pay

## 2019-07-24 ENCOUNTER — Encounter: Payer: Self-pay | Admitting: Medical

## 2019-07-24 VITALS — BP 108/68 | HR 72 | Temp 98.2°F | Resp 16 | Ht 63.0 in | Wt 171.4 lb

## 2019-07-24 DIAGNOSIS — Z0001 Encounter for general adult medical examination with abnormal findings: Secondary | ICD-10-CM | POA: Diagnosis not present

## 2019-07-24 DIAGNOSIS — R252 Cramp and spasm: Secondary | ICD-10-CM

## 2019-07-24 DIAGNOSIS — Z23 Encounter for immunization: Secondary | ICD-10-CM | POA: Diagnosis not present

## 2019-07-24 DIAGNOSIS — E669 Obesity, unspecified: Secondary | ICD-10-CM | POA: Diagnosis not present

## 2019-07-24 DIAGNOSIS — Z0000 Encounter for general adult medical examination without abnormal findings: Secondary | ICD-10-CM

## 2019-07-24 LAB — CBC WITH DIFFERENTIAL/PLATELET
Basophils Absolute: 0 10*3/uL (ref 0.0–0.1)
Basophils Relative: 0.2 % (ref 0.0–3.0)
Eosinophils Absolute: 0 10*3/uL (ref 0.0–0.7)
Eosinophils Relative: 0.8 % (ref 0.0–5.0)
HCT: 41.1 % (ref 36.0–46.0)
Hemoglobin: 13.9 g/dL (ref 12.0–15.0)
Lymphocytes Relative: 40.5 % (ref 12.0–46.0)
Lymphs Abs: 2.3 10*3/uL (ref 0.7–4.0)
MCHC: 33.7 g/dL (ref 30.0–36.0)
MCV: 91.9 fl (ref 78.0–100.0)
Monocytes Absolute: 0.3 10*3/uL (ref 0.1–1.0)
Monocytes Relative: 5.9 % (ref 3.0–12.0)
Neutro Abs: 2.9 10*3/uL (ref 1.4–7.7)
Neutrophils Relative %: 52.6 % (ref 43.0–77.0)
Platelets: 239 10*3/uL (ref 150.0–400.0)
RBC: 4.47 Mil/uL (ref 3.87–5.11)
RDW: 13.1 % (ref 11.5–15.5)
WBC: 5.6 10*3/uL (ref 4.0–10.5)

## 2019-07-24 LAB — COMPREHENSIVE METABOLIC PANEL
ALT: 27 U/L (ref 0–35)
AST: 24 U/L (ref 0–37)
Albumin: 4.2 g/dL (ref 3.5–5.2)
Alkaline Phosphatase: 84 U/L (ref 39–117)
BUN: 10 mg/dL (ref 6–23)
CO2: 24 mEq/L (ref 19–32)
Calcium: 9.1 mg/dL (ref 8.4–10.5)
Chloride: 107 mEq/L (ref 96–112)
Creatinine, Ser: 0.98 mg/dL (ref 0.40–1.20)
GFR: 61.44 mL/min (ref >60.00)
Glucose, Bld: 84 mg/dL (ref 70–99)
Potassium: 4.3 mEq/L (ref 3.5–5.1)
Sodium: 138 mEq/L (ref 135–145)
Total Bilirubin: 0.3 mg/dL (ref 0.2–1.2)
Total Protein: 6.6 g/dL (ref 6.0–8.3)

## 2019-07-24 LAB — LIPID PANEL
Cholesterol: 192 mg/dL (ref 0–200)
HDL: 40.4 mg/dL (ref >39.00)
LDL Cholesterol: 124 mg/dL — ABNORMAL HIGH (ref 0–99)
NonHDL: 151.9
Total CHOL/HDL Ratio: 5
Triglycerides: 138 mg/dL (ref 0.0–149.0)
VLDL: 27.6 mg/dL (ref 0.0–40.0)

## 2019-07-24 LAB — MAGNESIUM: Magnesium: 2.2 mg/dL (ref 1.5–2.5)

## 2019-07-24 LAB — TSH: TSH: 2.77 u[IU]/mL (ref 0.35–4.50)

## 2019-07-24 MED ORDER — METFORMIN HCL 500 MG PO TABS: 500.0000 mg | ORAL_TABLET | Freq: Every day | ORAL | 3 refills | Status: DC

## 2019-07-24 MED ORDER — AZELASTINE HCL 0.05 % OP SOLN: 1.0000 [drp] | Freq: Two times a day (BID) | OPHTHALMIC | 11 refills | Status: DC

## 2019-07-24 NOTE — Telephone Encounter (Signed)
Rx metformin sent to pt pharmacy.

## 2019-07-24 NOTE — Progress Notes (Signed)
Subjective:    Patient ID: Lindsey Macias, female    DOB: 01-03-1976, 43 y.o.   MRN: [id number]  HPI   Pt in for wellness exam.   Diet-  Eating well rounded balanced diet. 1200-1500 calorie diet. Pt only half calories.  Exercise- has been walking a lot even in heat. Smoker-Pt has stopped since 2018. alcohol-very rare.  Epic review states needs t-dap. Pt is overdue on pap. Pt states papsmear is scheduled with gyn.   lmp-mirena.    Review of Systems  Constitutional: Negative for chills, fatigue and fever.  HENT: Positive for congestion. Negative for ear discharge, facial swelling, nosebleeds, sinus pressure, sinus pain and sneezing.        Minimal baseline congestion.  Respiratory: Negative for cough, chest tightness and wheezing.   Cardiovascular: Negative for chest pain and palpitations.  Gastrointestinal: Negative for abdominal pain.  Genitourinary: Negative for dysuria and frequency.  Musculoskeletal: Negative for back pain.       Some occasional rt side calf cramping. When she exercises will get.  Skin: Negative for rash.  Neurological: Negative for dizziness, weakness and light-headedness.  Hematological: Negative for adenopathy. Does not bruise/bleed easily.  Psychiatric/Behavioral: Negative for behavioral problems and confusion.    Past Medical History:  Diagnosis Date  . Allergy   . Cancer [facility]) 2003   cervical cancer 2002 and 2003. had leep procedure.  Marland Kitchen GERD (gastroesophageal reflux disease)    but actually had h pylori and took meds then got better.  . Varicose veins of both lower extremities      Social History   Socioeconomic History  . Marital status: Divorced    Spouse name: Not on file  . Number of children: Not on file  . Years of education: Not on file  . Highest education level: Not on file  Occupational History  . Not on file  Social Needs  . Financial resource strain: Not on file  . Food insecurity    Worry: Not on file   Inability: Not on file  . Transportation needs    Medical: Not on file    Non-medical: Not on file  Tobacco Use  . Smoking status: Current Some Day Smoker    Packs/day: 0.00    Types: Cigarettes    Start date: 11/23/1994  . Smokeless tobacco: Never Used  Substance and Sexual Activity  . Alcohol use: Yes    Comment: Pt states she drinks 2-3 drinks per year.  . Drug use: No  . Sexual activity: Not on file  Lifestyle  . Physical activity    Days per week: Not on file    Minutes per session: Not on file  . Stress: Not on file  Relationships  . Social Herbalist on phone: Not on file    Gets together: Not on file    Attends religious service: Not on file    Active member of club or organization: Not on file    Attends meetings of clubs or organizations: Not on file    Relationship status: Not on file  . Intimate partner violence    Fear of current or ex partner: Not on file    Emotionally abused: Not on file    Physically abused: Not on file    Forced sexual activity: Not on file  Other Topics Concern  . Not on file  Social History Narrative  . Not on file    Past Surgical History:  Procedure Laterality Date  .  Bunions surgery Right 2010 and 2012  . SHOULDER SURGERY Right 2012  . TONSILLECTOMY Bilateral 2012    Family History  Problem Relation Age of Onset  . Cancer Father        Brain    Allergies  Allergen Reactions  . Vicodin [Hydrocodone-Acetaminophen] Photosensitivity  . Dust Mite Extract     Sneezing,Watery eyes,Runny nose  . Sulfa Antibiotics     Upset stomach    Current Outpatient Medications on File Prior to Visit  Medication Sig Dispense Refill  . aspirin EC 81 MG EC tablet Take 1 tablet (81 mg total) by mouth daily.    Marland Kitchen azelastine (ASTELIN) 0.1 % nasal spray PLACE 2 SPRAYS INTO BOTH NOSTRILS 2 (TWO) TIMES DAILY. 30 mL 3  . cetirizine (ZYRTEC) 10 MG tablet Take 10 mg by mouth daily.    Marland Kitchen eletriptan (RELPAX) 20 MG tablet Take 1 tablet  (20 mg total) by mouth as needed for migraine or headache. May repeat in 2 hours if headache persists or recurs. 10 tablet 0  . esomeprazole (NEXIUM) 20 MG capsule 1 tab po a day(generic ok) 90 capsule 1  . mometasone (NASONEX) 50 MCG/ACT nasal spray PLACE 2 SPRAYS INTO THE NOSE DAILY. 17 g 6   No current facility-administered medications on file prior to visit.     BP 108/68   Pulse 72   Temp 98.2 F (36.8 C) (Oral)   Resp 16   Ht 5' 3"  (1.6 m)   Wt 171 lb 6.4 oz (77.7 kg)   SpO2 100%   BMI 30.36 kg/m       Objective:   Physical Exam  General Mental Status- Alert. General Appearance- Not in acute distress.   Skin General: Color- Normal Color. Moisture- Normal Moisture.  Neck Carotid Arteries- Normal color. Moisture- Normal Moisture. No carotid bruits. No JVD.  Chest and Lung Exam Auscultation: Breath Sounds:-Normal.  Cardiovascular Auscultation:Rythm- Regular. Murmurs & Other Heart Sounds:Auscultation of the heart reveals- No Murmurs.  Abdomen Inspection:-Inspeection Normal. Palpation/Percussion:Note:No mass. Palpation and Percussion of the abdomen reveal- Non Tender, Non Distended + BS, no rebound or guarding.   Neurologic Cranial Nerve exam:- CN III-XII intact(No nystagmus), symmetric smile. Strength:- 5/5 equal and symmetric strength both upper and lower extremities.      Assessment & Plan:  For you wellness exam today I have ordered cbc, cmp, lipid panel, and tsh.  Vaccine given today tdap.  Recommend exercise and healthy diet.  Counseled on weight loss efforts. Will add tsh due to weight gain obesity.  We will let you know lab results as they come in.  Follow up date appointment will be determined after lab review.

## 2019-07-24 NOTE — Telephone Encounter (Signed)
eletriptan (RELPAX) 20 MG tablet  azelastine (ASTELIN) 0.1 % nasal spray  esomeprazole (NEXIUM) 20 MG capsule  mometasone (NASONEX) 50 MCG/ACT nasal spray  Did not call all in after appt this am as discussed       [place], [place] [facility] [place] [phone] (Phone) [phone] (Fax)

## 2019-07-24 NOTE — Patient Instructions (Addendum)
For you wellness exam today I have ordered cbc, cmp, lipid panel, and tsh.  Vaccine given today tdap.  Recommend exercise and healthy diet.  Counseled on weight loss efforts. Will add tsh due to weight gain obesity.  We will let you know lab results as they come in.  Follow up date appointment will be determined after lab review.    Preventive Care 8-45 Years Old, Female Preventive care refers to visits with your health care provider and lifestyle choices that can promote health and wellness. This includes:  A yearly physical exam. This may also be called an annual well check.  Regular dental visits and eye exams.  Immunizations.  Screening for certain conditions.  Healthy lifestyle choices, such as eating a healthy diet, getting regular exercise, not using drugs or products that contain nicotine and tobacco, and limiting alcohol use. What can I expect for my preventive care visit? Physical exam Your health care provider will check your:  Height and weight. This may be used to calculate body mass index (BMI), which tells if you are at a healthy weight.  Heart rate and blood pressure.  Skin for abnormal spots. Counseling Your health care provider may ask you questions about your:  Alcohol, tobacco, and drug use.  Emotional well-being.  Home and relationship well-being.  Sexual activity.  Eating habits.  Work and work Statistician.  Method of birth control.  Menstrual cycle.  Pregnancy history. What immunizations do I need?  Influenza (flu) vaccine  This is recommended every year. Tetanus, diphtheria, and pertussis (Tdap) vaccine  You may need a Td booster every 10 years. Varicella (chickenpox) vaccine  You may need this if you have not been vaccinated. Zoster (shingles) vaccine  You may need this after age 12. Measles, mumps, and rubella (MMR) vaccine  You may need at least one dose of MMR if you were born in 1957 or later. You may also need a  second dose. Pneumococcal conjugate (PCV13) vaccine  You may need this if you have certain conditions and were not previously vaccinated. Pneumococcal polysaccharide (PPSV23) vaccine  You may need one or two doses if you smoke cigarettes or if you have certain conditions. Meningococcal conjugate (MenACWY) vaccine  You may need this if you have certain conditions. Hepatitis A vaccine  You may need this if you have certain conditions or if you travel or work in places where you may be exposed to hepatitis A. Hepatitis B vaccine  You may need this if you have certain conditions or if you travel or work in places where you may be exposed to hepatitis B. Haemophilus influenzae type b (Hib) vaccine  You may need this if you have certain conditions. Human papillomavirus (HPV) vaccine  If recommended by your health care provider, you may need three doses over 6 months. You may receive vaccines as individual doses or as more than one vaccine together in one shot (combination vaccines). Talk with your health care provider about the risks and benefits of combination vaccines. What tests do I need? Blood tests  Lipid and cholesterol levels. These may be checked every 5 years, or more frequently if you are over 55 years old.  Hepatitis C test.  Hepatitis B test. Screening  Lung cancer screening. You may have this screening every year starting at age 76 if you have a 30-pack-year history of smoking and currently smoke or have quit within the past 15 years.  Colorectal cancer screening. All adults should have this screening starting at  age 67 and continuing until age 102. Your health care provider may recommend screening at age 50 if you are at increased risk. You will have tests every 1-10 years, depending on your results and the type of screening test.  Diabetes screening. This is done by checking your blood sugar (glucose) after you have not eaten for a while (fasting). You may have this done  every 1-3 years.  Mammogram. This may be done every 1-2 years. Talk with your health care provider about when you should start having regular mammograms. This may depend on whether you have a family history of breast cancer.  BRCA-related cancer screening. This may be done if you have a family history of breast, ovarian, tubal, or peritoneal cancers.  Pelvic exam and Pap test. This may be done every 3 years starting at age 52. Starting at age 98, this may be done every 5 years if you have a Pap test in combination with an HPV test. Other tests  Sexually transmitted disease (STD) testing.  Bone density scan. This is done to screen for osteoporosis. You may have this scan if you are at high risk for osteoporosis. Follow these instructions at home: Eating and drinking  Eat a diet that includes fresh fruits and vegetables, whole grains, lean protein, and low-fat dairy.  Take vitamin and mineral supplements as recommended by your health care provider.  Do not drink alcohol if: ? Your health care provider tells you not to drink. ? You are pregnant, may be pregnant, or are planning to become pregnant.  If you drink alcohol: ? Limit how much you have to 0-1 drink a day. ? Be aware of how much alcohol is in your drink. In the [place] one drink equals one 12 oz bottle of beer (355 mL), one 5 oz glass of wine (148 mL), or one 1 oz glass of hard liquor (44 mL). Lifestyle  Take daily care of your teeth and gums.  Stay active. Exercise for at least 30 minutes on 5 or more days each week.  Do not use any products that contain nicotine or tobacco, such as cigarettes, e-cigarettes, and chewing tobacco. If you need help quitting, ask your health care provider.  If you are sexually active, practice safe sex. Use a condom or other form of birth control (contraception) in order to prevent pregnancy and STIs (sexually transmitted infections).  If told by your health care provider, take low-dose aspirin  daily starting at age 28. What's next?  Visit your health care provider once a year for a well check visit.  Ask your health care provider how often you should have your eyes and teeth checked.  Stay up to date on all vaccines. This information is not intended to replace advice given to you by your health care provider. Make sure you discuss any questions you have with your health care provider. Document Released: 12/27/2015 Document Revised: 08/11/2018 Document Reviewed: 08/11/2018 Elsevier Patient Education  2020 [employer].

## 2019-07-25 MED ORDER — AZELASTINE HCL 0.1 % NA SOLN: NASAL | 3 refills | Status: DC

## 2019-07-25 MED ORDER — ELETRIPTAN HYDROBROMIDE 20 MG PO TABS: 20.0000 mg | ORAL_TABLET | ORAL | 0 refills | Status: DC | PRN

## 2019-07-25 MED ORDER — MOMETASONE FUROATE 50 MCG/ACT NA SUSP: 2.0000 | Freq: Every day | NASAL | 6 refills | Status: DC

## 2019-07-25 MED FILL — metFORMIN HCL 500 MG TABS: 500 | 30 days supply | Qty: 30 | Fill #0

## 2019-07-28 ENCOUNTER — Encounter: Payer: Self-pay | Admitting: Medical

## 2019-07-31 ENCOUNTER — Telehealth: Payer: Self-pay | Admitting: Medical

## 2019-07-31 MED ORDER — ESOMEPRAZOLE MAGNESIUM 20 MG PO CPDR: DELAYED_RELEASE_CAPSULE | ORAL | 1 refills | Status: DC

## 2019-07-31 NOTE — Telephone Encounter (Signed)
Sent in rx omeprazole to pt pharmacy.

## 2019-08-01 ENCOUNTER — Telehealth (HOSPITAL_COMMUNITY): Payer: Self-pay | Admitting: Rehabilitation

## 2019-08-01 NOTE — Telephone Encounter (Signed)

## 2019-08-02 ENCOUNTER — Other Ambulatory Visit: Payer: Self-pay

## 2019-08-02 ENCOUNTER — Encounter: Payer: Self-pay | Admitting: Vascular Surgery

## 2019-08-02 ENCOUNTER — Ambulatory Visit: Payer: BC Managed Care – PPO | Admitting: Vascular Surgery

## 2019-08-02 VITALS — BP 108/61 | HR 81 | Temp 97.9°F | Resp 16 | Ht 63.0 in | Wt 167.0 lb

## 2019-08-02 DIAGNOSIS — I83813 Varicose veins of bilateral lower extremities with pain: Secondary | ICD-10-CM | POA: Diagnosis not present

## 2019-08-02 DIAGNOSIS — I83811 Varicose veins of right lower extremities with pain: Secondary | ICD-10-CM

## 2019-08-02 HISTORY — PX: ENDOVENOUS ABLATION SAPHENOUS VEIN W/ LASER: SUR449

## 2019-08-02 NOTE — Progress Notes (Signed)
     Laser Ablation Procedure    Date: 08/02/2019   Lindsey Macias DOB:1976/10/22  Consent signed: Yes    Surgeon:  Dr. Ruta Hinds  Procedure: Laser Ablation: right Greater Saphenous Vein  BP 108/61 (BP Location: Left Arm, Patient Position: Sitting, Cuff Size: Normal)   Pulse 81   Temp 97.9 F (36.6 C) (Temporal)   Resp 16   Ht 5' 3"  (1.6 m)   Wt 167 lb (75.8 kg)   SpO2 98%   BMI 29.58 kg/m   Tumescent Anesthesia: 400 cc 0.9% NaCl with 50 cc Lidocaine HCL 1 % and 15 cc 8.4% NaHCO3  Local Anesthesia: 6 cc Lidocaine HCL and NaHCO3 (ratio 2:1)  15 watts continuous mode        Total energy: 2023Joules    Total time: 2:15  Laser fiber Ref# [id number]  Lot # [id number]  Patient tolerated procedure well  Notes: [name] wore facial shield. All staff members wore facial masks and facial shields/goggles. Discharge instructions given to patient and hardcopy given to patient to take home.    Description of Procedure:  After marking the course of the secondary varicosities, the patient was placed on the operating table in the supine position, and the right leg was prepped and draped in sterile fashion.   Local anesthetic was administered and under ultrasound guidance the saphenous vein was accessed with a micro needle and guide wire; then the mirco puncture sheath was placed.  A guide wire was inserted saphenofemoral junction , followed by a 5 french sheath.  The position of the sheath and then the laser fiber below the junction was confirmed using the ultrasound.  Tumescent anesthesia was administered along the course of the saphenous vein using ultrasound guidance. The patient was placed in Trendelenburg position and protective laser glasses were placed on patient and staff, and the laser was fired at 15 watts continuous mode advancing 1-43m/second for a total of 2023 joules.     Steri strip was applied to the stab IV insertion site and ABD pads and thigh high compression stockings  were applied.  Ace wrap bandages were applied  at the top of the saphenofemoral junction. Blood loss was less than 15 cc.  The patient ambulated out of the operating room having tolerated the procedure well.  CRuta Hinds MD [facility]onOffice: [phone]: [phone]

## 2019-08-04 ENCOUNTER — Other Ambulatory Visit: Payer: Self-pay

## 2019-08-04 DIAGNOSIS — I83811 Varicose veins of right lower extremities with pain: Secondary | ICD-10-CM

## 2019-08-08 ENCOUNTER — Telehealth (HOSPITAL_COMMUNITY): Payer: Self-pay

## 2019-08-08 NOTE — Telephone Encounter (Signed)
The above patient or their representative was contacted and gave the following answers to these questions:         Do you have any of the following symptoms? No  Fever                    Cough                   Shortness of breath  Do  you have any of the following other symptoms?  No  muscle pain         vomiting,        diarrhea        rash         weakness        red eye        abdominal pain         bruising          bruising or bleeding              joint pain           severe headache    Have you been in contact with someone who was or has been sick in the past 2 weeks? No Yes                 Unsure                         Unable to assess   Does the person that you were in contact with have any of the following symptoms?   Cough         shortness of breath           muscle pain         vomiting,            diarrhea            rash            weakness           fever            red eye           abdominal pain           bruising  or  bleeding                joint pain                severe headache                 COMMENTS OR ACTION PLAN FOR THIS PATIENT:

## 2019-08-09 ENCOUNTER — Ambulatory Visit (HOSPITAL_COMMUNITY)
Admission: RE | Admit: 2019-08-09 | Discharge: 2019-08-09 | Disposition: A | Discharge status: Home / Self Care | Payer: BC Managed Care – PPO | Source: Ambulatory Visit | Attending: Vascular Surgery | Admitting: Vascular Surgery

## 2019-08-09 ENCOUNTER — Other Ambulatory Visit: Payer: Self-pay

## 2019-08-09 ENCOUNTER — Encounter: Payer: Self-pay | Admitting: Vascular Surgery

## 2019-08-09 ENCOUNTER — Ambulatory Visit: Payer: Self-pay | Admitting: Vascular Surgery

## 2019-08-09 VITALS — BP 111/74 | HR 69 | Temp 97.4°F | Resp 16 | Ht 63.0 in | Wt 167.0 lb

## 2019-08-09 DIAGNOSIS — I83811 Varicose veins of right lower extremities with pain: Secondary | ICD-10-CM | POA: Diagnosis not present

## 2019-08-09 NOTE — Progress Notes (Signed)
Patient is a 43 year old female who returns for postoperative follow-up today.  She underwent laser ablation of the right greater saphenous vein August 02, 2019.  She has had some bruising but otherwise feels well.  She has no shortness of breath.  She has no chest pain.  Physical exam:  Vitals:   08/09/19 1342  BP: 111/74  Pulse: 69  Resp: 16  Temp: (!) 97.4 F (36.3 C)  TempSrc: Temporal  SpO2: 99%  Weight: 167 lb (75.8 kg)  Height: 5' 3"  (1.6 m)    Right lower extremity some ecchymosis over the course of the saphenous vein no palpable cord no significant erythema  Data: Patient had a duplex ultrasound today which showed the right greater saphenous vein has successful closure within 1/2 cm of saphenofemoral junction.  There is no evidence of DVT.  Assessment: Doing well status post laser ablation right greater saphenous vein.  Patient also has some spider veins near her right ankle that she wishes to have sclerotherapy performed.  She will call her vein nurse in the future for sclerotherapy session.  Otherwise she will follow-up in as-needed basis.  Plan: See above.  Ruta Hinds, MD [facility] [place] Office: [phone] Pager: [phone]

## 2019-08-25 MED FILL — metFORMIN HCL 500 MG TABS: 500 | 30 days supply | Qty: 30 | Fill #1

## 2019-09-19 ENCOUNTER — Ambulatory Visit: Payer: BC Managed Care – PPO

## 2019-09-19 ENCOUNTER — Other Ambulatory Visit: Payer: Self-pay

## 2019-09-19 DIAGNOSIS — I8393 Asymptomatic varicose veins of bilateral lower extremities: Secondary | ICD-10-CM

## 2019-09-19 NOTE — Progress Notes (Signed)
Patient's small spider veins in R leg treated with 2 mL of 1% Asclera, administered with a 27g butterfly. Pt tolderated excellently. Anticipate good results. Pt plans to return to treat L left after having a laser ablation on left side. Provided pt with verbal and handout post procedure instructions.    Photos: Yes.    Compression stockings applied: Yes.

## 2019-09-25 ENCOUNTER — Encounter: Payer: Self-pay | Admitting: Vascular Surgery

## 2019-10-04 ENCOUNTER — Other Ambulatory Visit: Payer: BC Managed Care – PPO | Admitting: Vascular Surgery

## 2019-10-16 ENCOUNTER — Encounter: Payer: Self-pay | Admitting: Medical

## 2019-10-16 ENCOUNTER — Ambulatory Visit: Payer: BC Managed Care – PPO | Admitting: Medical

## 2019-10-16 ENCOUNTER — Other Ambulatory Visit: Payer: Self-pay

## 2019-10-16 VITALS — BP 121/81 | HR 84 | Temp 97.8°F | Resp 16 | Ht 63.0 in | Wt 165.8 lb

## 2019-10-16 DIAGNOSIS — R0789 Other chest pain: Secondary | ICD-10-CM | POA: Diagnosis not present

## 2019-10-16 DIAGNOSIS — R232 Flushing: Secondary | ICD-10-CM

## 2019-10-16 DIAGNOSIS — F419 Anxiety disorder, unspecified: Secondary | ICD-10-CM | POA: Diagnosis not present

## 2019-10-16 LAB — FOLLICLE STIMULATING HORMONE: FSH: 5 m[IU]/mL

## 2019-10-16 MED ORDER — OMEPRAZOLE 40 MG PO CPDR: 40.0000 mg | DELAYED_RELEASE_CAPSULE | Freq: Every day | ORAL | 3 refills | Status: DC

## 2019-10-16 MED ORDER — BUSPIRONE HCL 7.5 MG PO TABS: 7.5000 mg | ORAL_TABLET | Freq: Three times a day (TID) | ORAL | 0 refills | Status: DC

## 2019-10-16 NOTE — Patient Instructions (Signed)
For anxiety, I prescribed buspar. Will see if this helps. If you feel more depressed can rx medication as well. But will assess response to buspar first.  Your ekg appears to be normal sinus rhythm. Sounds like anxiety provoking atypical pain. But if you have significant severe pain despite control of anxiety then recommend ED evaluation. Also may refer you to cardiologist for work up.  For possible hot flashes and mom with premature menopause will get fsh today.  For hx of gerd refill nexium but at higher dose since this is needed to control symptoms.  Follow up 10-14 days or as needed

## 2019-10-16 NOTE — Progress Notes (Signed)
Subjective:    Patient ID: Lindsey Macias, female    DOB: February 21, 1976, 43 y.o.   MRN: [id number]  HPI  Pt in states she has been having some anxiety. She states she usually does not have anxiety in the past. She states usually has high threshold to handle stress.   Pt states her current job that she has started in January. Pt states one worker passed away and another got let go. She is doing both of there work. In additions stress with pandemic.   She cries easily and beginning to feel like will shake apart. She feels overloaded.  On weekends when gets to [place] to visit mom she feels relaxed.   LMP- for years and years. Pt is on Mirena. She feels like flushing easily in face. Mom went through stared menopause/perminopause      Review of Systems  Constitutional: Negative for chills, fatigue and fever.  Respiratory: Negative for cough, chest tightness, shortness of breath and wheezing.   Cardiovascular: Positive for palpitations. Negative for chest pain.       Last few weeks with anxiety some intermittent transient brief pain but lump throat. If she takes deep breath will go away.  Gastrointestinal: Negative for abdominal distention, anal bleeding, constipation and diarrhea.       With anxiety she feel like rock in her stomach.  Musculoskeletal: Negative for back pain, myalgias and neck stiffness.  Skin: Negative for rash.  Neurological: Negative for dizziness, speech difficulty, weakness and numbness.  Hematological: Negative for adenopathy. Does not bruise/bleed easily.       Some mild mood decreased. Pt is considering job change that might resolve situation.  Psychiatric/Behavioral: Negative for agitation, behavioral problems, decreased concentration, hallucinations, sleep disturbance and suicidal ideas. The patient is nervous/anxious.     Past Medical History:  Diagnosis Date  . Allergy   . Cancer [facility]) 2003   cervical cancer 2002 and 2003. had leep procedure.  Marland Kitchen  GERD (gastroesophageal reflux disease)    but actually had h pylori and took meds then got better.  . Varicose veins of both lower extremities      Social History   Socioeconomic History  . Marital status: Divorced    Spouse name: Not on file  . Number of children: Not on file  . Years of education: Not on file  . Highest education level: Not on file  Occupational History  . Not on file  Social Needs  . Financial resource strain: Not on file  . Food insecurity    Worry: Not on file    Inability: Not on file  . Transportation needs    Medical: Not on file    Non-medical: Not on file  Tobacco Use  . Smoking status: Former Smoker    Packs/day: 0.00    Types: Cigarettes    Start date: 11/23/1994    Quit date: 07/28/2017    Years since quitting: 2.2  . Smokeless tobacco: Never Used  Substance and Sexual Activity  . Alcohol use: Yes    Comment: Pt states she drinks 2-3 drinks per year.  . Drug use: No  . Sexual activity: Not on file  Lifestyle  . Physical activity    Days per week: Not on file    Minutes per session: Not on file  . Stress: Not on file  Relationships  . Social Herbalist on phone: Not on file    Gets together: Not on file  Attends religious service: Not on file    Active member of club or organization: Not on file    Attends meetings of clubs or organizations: Not on file    Relationship status: Not on file  . Intimate partner violence    Fear of current or ex partner: Not on file    Emotionally abused: Not on file    Physically abused: Not on file    Forced sexual activity: Not on file  Other Topics Concern  . Not on file  Social History Narrative  . Not on file    Past Surgical History:  Procedure Laterality Date  . Bunions surgery Right 2010 and 2012  . ENDOVENOUS ABLATION SAPHENOUS VEIN W/ LASER Right 08/02/2019   endovenous laser ablation right greater saphenous vein by Ruta Hinds MD   . SHOULDER SURGERY Right 2012  .  TONSILLECTOMY Bilateral 2012    Family History  Problem Relation Age of Onset  . Cancer Father        Brain    Allergies  Allergen Reactions  . Vicodin [Hydrocodone-Acetaminophen] Photosensitivity  . Dust Mite Extract     Sneezing,Watery eyes,Runny nose  . Sulfa Antibiotics     Upset stomach    Current Outpatient Medications on File Prior to Visit  Medication Sig Dispense Refill  . aspirin EC 81 MG EC tablet Take 1 tablet (81 mg total) by mouth daily.    Marland Kitchen azelastine (ASTELIN) 0.1 % nasal spray PLACE 2 SPRAYS INTO BOTH NOSTRILS 2 (TWO) TIMES DAILY. 30 mL 3  . azelastine (OPTIVAR) 0.05 % ophthalmic solution Place 1 drop into both eyes 2 (two) times daily. 6 mL 11  . cetirizine (ZYRTEC) 10 MG tablet Take 10 mg by mouth daily.    Marland Kitchen eletriptan (RELPAX) 20 MG tablet Take 1 tablet (20 mg total) by mouth as needed for migraine or headache. May repeat in 2 hours if headache persists or recurs. 10 tablet 0  . esomeprazole (NEXIUM) 20 MG capsule 1 tab po a day(generic ok) 90 capsule 1  . metFORMIN (GLUCOPHAGE) 500 MG tablet Take 1 tablet (500 mg total) by mouth daily with breakfast. 30 tablet 3  . mometasone (NASONEX) 50 MCG/ACT nasal spray Place 2 sprays into the nose daily. 17 g 6   No current facility-administered medications on file prior to visit.     BP 121/81   Pulse 84   Temp 97.8 F (36.6 C) (Temporal)   Resp 16   Ht 5' 3"  (1.6 m)   Wt 165 lb 12.8 oz (75.2 kg)   SpO2 100%   BMI 29.37 kg/m       Objective:   Physical Exam  General Mental Status- Alert. General Appearance- Not in acute distress.   Skin General: Color- Normal Color. Moisture- Normal Moisture.  Neck Carotid Arteries- Normal color. Moisture- Normal Moisture. No carotid bruits. No JVD.  Chest and Lung Exam Auscultation: Breath Sounds:-Normal.  Cardiovascular Auscultation:Rythm- Regular. Murmurs & Other Heart Sounds:Auscultation of the heart reveals- No Murmurs.  Abdomen  Inspection:-Inspeection Normal. Palpation/Percussion:Note:No mass. Palpation and Percussion of the abdomen reveal- Non Tender, Non Distended + BS, no rebound or guarding.   Neurologic Cranial Nerve exam:- CN III-XII intact(No nystagmus), symmetric smile. Strength:- 5/5 equal and symmetric strength both upper and lower extremities.      Assessment & Plan:  For anxiety, I prescribed buspar. Will see if this helps. If you feel more depressed can rx medication as well. But will assess response to buspar first.  Your ekg appears to be normal sinus rhythm. Sounds like anxiety provoking atypical pain. But if you have significant severe pain despite control of anxiety then recommend ED evaluation. Also may refer you to cardiologist for work up.  For possible hot flashes and mom with premature menopause will get fsh today.  For hx of gerd refill nexium but at higher dose since this is needed to control symptoms.  Follow up 10-14 days or as needed  25 minutes spent with pt. 50% of time spent counseling pt on plan going forward.  Mackie Pai, PA-C

## 2019-10-17 ENCOUNTER — Telehealth: Payer: Self-pay | Admitting: Medical

## 2019-10-17 NOTE — Telephone Encounter (Signed)
I accidentally sent in omeprazole rather than nexium. Explained to pt and apologized. Explained same ppi class and works same mechanism. Also discussed benefit vs risk of flu and her medical her sma. She is getting flu vaccine at her work.

## 2019-10-17 NOTE — Telephone Encounter (Signed)
Pt states that she has been taking Nexium but this was called in for her to the pharmacy - omeprazole (PRILOSEC) 40 MG capsule.  Pt wants to know what medication she is supposed to be on.

## 2019-10-18 ENCOUNTER — Ambulatory Visit: Payer: BC Managed Care – PPO | Admitting: Vascular Surgery

## 2019-10-18 ENCOUNTER — Encounter (HOSPITAL_COMMUNITY): Payer: BC Managed Care – PPO

## 2019-10-30 ENCOUNTER — Other Ambulatory Visit: Payer: Self-pay

## 2019-10-30 ENCOUNTER — Telehealth: Payer: Self-pay | Admitting: Medical

## 2019-10-30 ENCOUNTER — Ambulatory Visit (INDEPENDENT_AMBULATORY_CARE_PROVIDER_SITE_OTHER): Payer: BC Managed Care – PPO | Admitting: Medical

## 2019-10-30 ENCOUNTER — Encounter: Payer: Self-pay | Admitting: Medical

## 2019-10-30 VITALS — BP 121/80 | Temp 97.9°F | Wt 152.0 lb

## 2019-10-30 DIAGNOSIS — R05 Cough: Secondary | ICD-10-CM

## 2019-10-30 DIAGNOSIS — J3489 Other specified disorders of nose and nasal sinuses: Secondary | ICD-10-CM | POA: Diagnosis not present

## 2019-10-30 DIAGNOSIS — R059 Cough, unspecified: Secondary | ICD-10-CM

## 2019-10-30 MED ORDER — BENZONATATE 100 MG PO CAPS: 100.0000 mg | ORAL_CAPSULE | Freq: Three times a day (TID) | ORAL | 0 refills | Status: DC | PRN

## 2019-10-30 MED ORDER — FLUTICASONE PROPIONATE 50 MCG/ACT NA SUSP: 2.0000 | Freq: Every day | NASAL | 1 refills | Status: DC

## 2019-10-30 MED ORDER — FLUCONAZOLE 150 MG PO TABS: 150.0000 mg | ORAL_TABLET | Freq: Once | ORAL | 0 refills | Status: AC

## 2019-10-30 MED ORDER — DOXYCYCLINE HYCLATE 100 MG PO TABS: 100.0000 mg | ORAL_TABLET | Freq: Two times a day (BID) | ORAL | 0 refills | Status: DC

## 2019-10-30 NOTE — Telephone Encounter (Signed)
Forms faxed

## 2019-10-30 NOTE — Patient Instructions (Addendum)
You do have likely sinus infection symptoms but covid infection can't be ruled out presently. In light of pandemic we need to proceed with abundance of caution and get you tested. During the interim, I will  rx doxycycline antibiotic, flonase nasal spray and benzonatate cough tablet.  I did go ahead and send work excuse to your my chart.  Also since you wanted to do covid test thru you work we printed the order and asked staff to fax it to your office.  Follow up date to be determined.

## 2019-10-30 NOTE — Telephone Encounter (Signed)
Pt need to have  covid test done. But she wants it done thru her employer. Apparently they have a lab. Can you print lab order. I can sign the order then can you fax it to [phone]. It is her office.

## 2019-10-30 NOTE — Progress Notes (Signed)
Subjective:    Patient ID: Lindsey Macias, female    DOB: August 15, 1976, 43 y.o.   MRN: [id number]  HPI  Virtual Visit via Video Note  I connected with Lindsey Macias on 10/30/19 at  9:40 AM EST by a video enabled telemedicine application and verified that I am speaking with the correct person using two identifiers.  Location: Patient: home Provider: office   I discussed the limitations of evaluation and management by telemedicine and the availability of in person appointments. The patient expressed understanding and agreed to proceed.   History of Present Illness:   Pt in with just recent sinus pressure since Thursday afternoon. No sneezing.  Slight tickle in back of nose. Some yellow mucus from nose. No fever, no chills or sweats. No body aches. No change in sense of smell.   Observations/Objective:  General-no acute distress, pleasant, oriented. Lungs- on inspection lungs appear unlabored. Neck- no tracheal deviation or jvd on inspection. Neuro- gross motor function appears intact.   Assessment and Plan: You do have likely sinus infection symptoms but covid infection can't be ruled out presently. In light of pandemic we need to proceed with abundance of caution and get you tested. During the interim, I will  rx doxycycline antibiotic, flonase nasal spray and benzonatate cough tablet.  I did go ahead and send work excuse to your my chart.  Also since you wanted to do covid test thru you work we printed the order and asked staff to fax it to your office.  Follow up date to be determined.  Mackie Pai, PA-C  Follow Up Instructions:    I discussed the assessment and treatment plan with the patient. The patient was provided an opportunity to ask questions and all were answered. The patient agreed with the plan and demonstrated an understanding of the instructions.   The patient was advised to call back or seek an in-person evaluation if the symptoms worsen or if the  condition fails to improve as anticipated.  I provided 25 minutes of non-face-to-face time during this encounter.   Mackie Pai, PA-C   Review of Systems  Constitutional: Negative for chills, fatigue and fever.  HENT: Positive for congestion, postnasal drip, sinus pressure and sinus pain. Negative for sneezing.   Respiratory: Positive for cough. Negative for chest tightness, shortness of breath and wheezing.   Cardiovascular: Negative for chest pain and palpitations.  Gastrointestinal: Negative for abdominal pain.  Genitourinary: Negative for dysuria.  Skin: Negative for rash.  Neurological: Negative for dizziness and light-headedness.  Hematological: Negative for adenopathy. Does not bruise/bleed easily.  Psychiatric/Behavioral: Negative for behavioral problems, confusion and self-injury.   Past Medical History:  Diagnosis Date  . Allergy   . Cancer [facility]) 2003   cervical cancer 2002 and 2003. had leep procedure.  Marland Kitchen GERD (gastroesophageal reflux disease)    but actually had h pylori and took meds then got better.  . Varicose veins of both lower extremities      Social History   Socioeconomic History  . Marital status: Divorced    Spouse name: Not on file  . Number of children: Not on file  . Years of education: Not on file  . Highest education level: Not on file  Occupational History  . Not on file  Social Needs  . Financial resource strain: Not on file  . Food insecurity    Worry: Not on file    Inability: Not on file  . Transportation needs  Medical: Not on file    Non-medical: Not on file  Tobacco Use  . Smoking status: Former Smoker    Packs/day: 0.00    Types: Cigarettes    Start date: 11/23/1994    Quit date: 07/28/2017    Years since quitting: 2.2  . Smokeless tobacco: Never Used  Substance and Sexual Activity  . Alcohol use: Yes    Comment: Pt states she drinks 2-3 drinks per year.  . Drug use: No  . Sexual activity: Not on file  Lifestyle  .  Physical activity    Days per week: Not on file    Minutes per session: Not on file  . Stress: Not on file  Relationships  . Social Herbalist on phone: Not on file    Gets together: Not on file    Attends religious service: Not on file    Active member of club or organization: Not on file    Attends meetings of clubs or organizations: Not on file    Relationship status: Not on file  . Intimate partner violence    Fear of current or ex partner: Not on file    Emotionally abused: Not on file    Physically abused: Not on file    Forced sexual activity: Not on file  Other Topics Concern  . Not on file  Social History Narrative  . Not on file    Past Surgical History:  Procedure Laterality Date  . Bunions surgery Right 2010 and 2012  . ENDOVENOUS ABLATION SAPHENOUS VEIN W/ LASER Right 08/02/2019   endovenous laser ablation right greater saphenous vein by Ruta Hinds MD   . SHOULDER SURGERY Right 2012  . TONSILLECTOMY Bilateral 2012    Family History  Problem Relation Age of Onset  . Cancer Father        Brain    Allergies  Allergen Reactions  . Vicodin [Hydrocodone-Acetaminophen] Photosensitivity  . Dust Mite Extract     Sneezing,Watery eyes,Runny nose  . Sulfa Antibiotics     Upset stomach    Current Outpatient Medications on File Prior to Visit  Medication Sig Dispense Refill  . aspirin EC 81 MG EC tablet Take 1 tablet (81 mg total) by mouth daily.    Marland Kitchen azelastine (ASTELIN) 0.1 % nasal spray PLACE 2 SPRAYS INTO BOTH NOSTRILS 2 (TWO) TIMES DAILY. 30 mL 3  . azelastine (OPTIVAR) 0.05 % ophthalmic solution Place 1 drop into both eyes 2 (two) times daily. 6 mL 11  . busPIRone (BUSPAR) 7.5 MG tablet Take 1 tablet (7.5 mg total) by mouth 3 (three) times daily. 30 tablet 0  . cetirizine (ZYRTEC) 10 MG tablet Take 10 mg by mouth daily.    Marland Kitchen eletriptan (RELPAX) 20 MG tablet Take 1 tablet (20 mg total) by mouth as needed for migraine or headache. May repeat in 2  hours if headache persists or recurs. 10 tablet 0  . metFORMIN (GLUCOPHAGE) 500 MG tablet Take 1 tablet (500 mg total) by mouth daily with breakfast. 30 tablet 3  . mometasone (NASONEX) 50 MCG/ACT nasal spray Place 2 sprays into the nose daily. 17 g 6  . omeprazole (PRILOSEC) 40 MG capsule Take 1 capsule (40 mg total) by mouth daily. 90 capsule 3   No current facility-administered medications on file prior to visit.     BP 121/80   Temp 97.9 F (36.6 C) (Oral)   Wt 152 lb (68.9 kg)   BMI 26.93 kg/m  Objective:   Physical Exam        Assessment & Plan:  [phone]

## 2019-11-14 ENCOUNTER — Encounter: Payer: Self-pay | Admitting: Vascular Surgery

## 2020-01-15 ENCOUNTER — Other Ambulatory Visit: Payer: Self-pay

## 2020-01-15 ENCOUNTER — Ambulatory Visit (INDEPENDENT_AMBULATORY_CARE_PROVIDER_SITE_OTHER): Payer: Managed Care, Other (non HMO) | Admitting: Medical

## 2020-01-15 ENCOUNTER — Encounter: Payer: Self-pay | Admitting: Medical

## 2020-01-15 ENCOUNTER — Ambulatory Visit: Payer: Managed Care, Other (non HMO) | Attending: Internal Medicine

## 2020-01-15 VITALS — Ht 63.0 in | Wt 168.0 lb

## 2020-01-15 DIAGNOSIS — J329 Chronic sinusitis, unspecified: Secondary | ICD-10-CM

## 2020-01-15 DIAGNOSIS — Z20822 Contact with and (suspected) exposure to covid-19: Secondary | ICD-10-CM

## 2020-01-15 DIAGNOSIS — J309 Allergic rhinitis, unspecified: Secondary | ICD-10-CM | POA: Diagnosis not present

## 2020-01-15 MED ORDER — AZELASTINE HCL 0.05 % OP SOLN: 1.0000 [drp] | Freq: Two times a day (BID) | OPHTHALMIC | 11 refills | Status: DC

## 2020-01-15 MED ORDER — AZELASTINE HCL 0.1 % NA SOLN: NASAL | 3 refills | Status: DC

## 2020-01-15 MED ORDER — AMOXICILLIN-POT CLAVULANATE 875-125 MG PO TABS: 1.0000 | ORAL_TABLET | Freq: Two times a day (BID) | ORAL | 0 refills | Status: DC

## 2020-01-15 MED ORDER — MOMETASONE FUROATE 50 MCG/ACT NA SUSP: 2.0000 | Freq: Every day | NASAL | 6 refills | Status: DC

## 2020-01-15 MED ORDER — MOMETASONE FUROATE 50 MCG/ACT NA SUSP: NASAL | 6 refills | Status: DC

## 2020-01-15 MED ORDER — ESOMEPRAZOLE MAGNESIUM 40 MG PO CPDR: 40.0000 mg | DELAYED_RELEASE_CAPSULE | Freq: Every day | ORAL | 11 refills | Status: DC

## 2020-01-15 MED FILL — MOMETASONE FUROATE 50 MCG S: 50 | 15 days supply | Qty: 17 | Fill #0

## 2020-01-15 MED FILL — AZELASTINE HCL 137 MCG SPRY: 0.1 | 25 days supply | Qty: 30 | Fill #0

## 2020-01-15 MED FILL — AZELASTINE HCL 0.05 % SOLN: 0.05 | 30 days supply | Qty: 6 | Fill #0

## 2020-01-15 MED FILL — AMOX-CLAV 875-125 MG TABLET: 875-125 | 10 days supply | Qty: 20 | Fill #0

## 2020-01-15 NOTE — Progress Notes (Signed)
   Subjective:    Patient ID: Lindsey Macias, female    DOB: 1976/05/24, 44 y.o.   MRN: [id number]  HPI  Virtual Visit via Video Note  I connected with Lindsey Macias on 01/15/20 at  9:40 AM EST by a video enabled telemedicine application and verified that I am speaking with the correct person using two identifiers.  Location: Patient: home Provider: office   I discussed the limitations of evaluation and management by telemedicine and the availability of in person appointments. The patient expressed understanding and agreed to proceed.  History of Present Illness:  Pt had recent sinus pressure, nasal congestion, headache on and off since November. She states this has been rough year compared to prior. Pt has hx of allergies and some sinus infections. Over last week symptoms a lot worse.  Pt thinks states needs refill of mometasone.   Observations/Objective: General-no acute distress, pleasant, oriented. Lungs- on inspection lungs appear unlabored. Neck- no tracheal deviation or jvd on inspection. Neuro- gross motor function appears intact. heent- frontal and maxillary sinus pressure by pt palpation.  Assessment and Plan: You do have history of allergic rhinitis and sinus infection.  The current signs and symptoms are off since November.  I do think you might have recurrent start infection and will treat with Augmentin antibiotic.  Also refill your Nasonex and Astelin nasal spray.  If with above treatment your sinus pressure symptoms do not improve by Thursday or Friday then consider adding tapered prednisone.  We will hold off presently due to reported side effects of pedal edema with last taper prednisone.  Do think is best for you to go ahead and get Covid testing as well as presentations do vary quite a bit.  We will get you tested for caution sake.  I gave number to call to schedule.  Sent excuse letter to her MyChart.  Follow-up date to be determined pending Covid test results  and update via MyChart later this week.  20 minutes spent with patient.  50% of time spent counseling patient on plan going forward.  Follow Up Instructions:    I discussed the assessment and treatment plan with the patient. The patient was provided an opportunity to ask questions and all were answered. The patient agreed with the plan and demonstrated an understanding of the instructions.   The patient was advised to call back or seek an in-person evaluation if the symptoms worsen or if the condition fails to improve as anticipated.  I provided 20  minutes of non-face-to-face time during this encounter.   Mackie Pai, PA-C   Review of Systems  Constitutional: Negative for chills, fatigue and fever.  Respiratory: Negative for cough, choking, shortness of breath and wheezing.        Objective:   Physical Exam        Assessment & Plan:

## 2020-01-15 NOTE — Patient Instructions (Signed)
You do have history of allergic rhinitis and sinus infection.  The current signs and symptoms are off since November.  I do think you might have recurrent start infection and will treat with Augmentin antibiotic.  Also refill your Nasonex and Astelin nasal spray.  If with above treatment your sinus pressure symptoms do not improve by Thursday or Friday then consider adding tapered prednisone.  We will hold off presently due to reported side effects of pedal edema with last taper prednisone.  Do think is best for you to go ahead and get Covid testing as well as presentations do vary quite a bit.  We will get you tested for caution sake.  I gave number to call to schedule.  Sent excuse letter to her MyChart.  Follow-up date to be determined pending Covid test results and update via MyChart later this week.

## 2020-01-16 LAB — NOVEL CORONAVIRUS, NAA: SARS-CoV-2, NAA: NOT DETECTED

## 2020-01-18 ENCOUNTER — Telehealth: Payer: Self-pay

## 2020-01-18 ENCOUNTER — Telehealth: Payer: Self-pay | Admitting: Medical

## 2020-01-18 ENCOUNTER — Encounter: Payer: Self-pay | Admitting: Medical

## 2020-01-18 MED ORDER — METHYLPREDNISOLONE 4 MG PO TABS: ORAL_TABLET | ORAL | 0 refills | Status: DC

## 2020-01-18 NOTE — Telephone Encounter (Signed)
Mometasone (Nasonex)--not covered by insurance.

## 2020-01-18 NOTE — Telephone Encounter (Signed)
Rx medrol sent to pt pharmacy.

## 2020-01-19 NOTE — Telephone Encounter (Signed)
Sent in medrol. For short term that should cover her nasal congestion. Not sure if we can get prior auth. She prefers nasonex. Her insurance used to cover

## 2020-02-12 ENCOUNTER — Other Ambulatory Visit: Payer: Self-pay

## 2020-02-13 ENCOUNTER — Ambulatory Visit: Payer: Managed Care, Other (non HMO) | Admitting: Women's Health

## 2020-02-13 ENCOUNTER — Other Ambulatory Visit: Payer: Self-pay | Admitting: Women's Health

## 2020-02-13 ENCOUNTER — Encounter: Payer: Self-pay | Admitting: Women's Health

## 2020-02-13 VITALS — BP 122/80 | Ht 63.0 in | Wt 165.0 lb

## 2020-02-13 DIAGNOSIS — Z01419 Encounter for gynecological examination (general) (routine) without abnormal findings: Secondary | ICD-10-CM | POA: Diagnosis not present

## 2020-02-13 DIAGNOSIS — N841 Polyp of cervix uteri: Secondary | ICD-10-CM

## 2020-02-13 DIAGNOSIS — K551 Chronic vascular disorders of intestine: Secondary | ICD-10-CM

## 2020-02-13 NOTE — Patient Instructions (Addendum)
Vit  D 1000 iu daily Good to meet you! Mammogram  [phone]  [facility]   Health Maintenance, Female Adopting a healthy lifestyle and getting preventive care are important in promoting health and wellness. Ask your health care provider about:  The right schedule for you to have regular tests and exams.  Things you can do on your own to prevent diseases and keep yourself healthy. What should I know about diet, weight, and exercise? Eat a healthy diet   Eat a diet that includes plenty of vegetables, fruits, low-fat dairy products, and lean protein.  Do not eat a lot of foods that are high in solid fats, added sugars, or sodium. Maintain a healthy weight Body mass index (BMI) is used to identify weight problems. It estimates body fat based on height and weight. Your health care provider can help determine your BMI and help you achieve or maintain a healthy weight. Get regular exercise Get regular exercise. This is one of the most important things you can do for your health. Most adults should:  Exercise for at least 150 minutes each week. The exercise should increase your heart rate and make you sweat (moderate-intensity exercise).  Do strengthening exercises at least twice a week. This is in addition to the moderate-intensity exercise.  Spend less time sitting. Even light physical activity can be beneficial. Watch cholesterol and blood lipids Have your blood tested for lipids and cholesterol at 46 years of age, then have this test every 5 years. Have your cholesterol levels checked more often if:  Your lipid or cholesterol levels are high.  You are older than 46 years of age.  You are at high risk for heart disease. What should I know about cancer screening? Depending on your health history and family history, you may need to have cancer screening at various ages. This may include screening for:  Breast cancer.  Cervical cancer.  Colorectal cancer.  Skin cancer.  Lung  cancer. What should I know about heart disease, diabetes, and high blood pressure? Blood pressure and heart disease  High blood pressure causes heart disease and increases the risk of stroke. This is more likely to develop in people who have high blood pressure readings, are of African descent, or are overweight.  Have your blood pressure checked: ? Every 3-5 years if you are 64-30 years of age. ? Every year if you are 34 years old or older. Diabetes Have regular diabetes screenings. This checks your fasting blood sugar level. Have the screening done:  Once every three years after age 45 if you are at a normal weight and have a low risk for diabetes.  More often and at a younger age if you are overweight or have a high risk for diabetes. What should I know about preventing infection? Hepatitis B If you have a higher risk for hepatitis B, you should be screened for this virus. Talk with your health care provider to find out if you are at risk for hepatitis B infection. Hepatitis C Testing is recommended for:  Everyone born from [birth year] through 1967.  Anyone with known risk factors for hepatitis C. Sexually transmitted infections (STIs)  Get screened for STIs, including gonorrhea and chlamydia, if: ? You are sexually active and are younger than 46 years of age. ? You are older than 46 years of age and your health care provider tells you that you are at risk for this type of infection. ? Your sexual activity has changed since you were  last screened, and you are at increased risk for chlamydia or gonorrhea. Ask your health care provider if you are at risk.  Ask your health care provider about whether you are at high risk for HIV. Your health care provider may recommend a prescription medicine to help prevent HIV infection. If you choose to take medicine to prevent HIV, you should first get tested for HIV. You should then be tested every 3 months for as long as you are taking the  medicine. Pregnancy  If you are about to stop having your period (premenopausal) and you may become pregnant, seek counseling before you get pregnant.  Take 400 to 800 micrograms (mcg) of folic acid every day if you become pregnant.  Ask for birth control (contraception) if you want to prevent pregnancy. Osteoporosis and menopause Osteoporosis is a disease in which the bones lose minerals and strength with aging. This can result in bone fractures. If you are 50 years old or older, or if you are at risk for osteoporosis and fractures, ask your health care provider if you should:  Be screened for bone loss.  Take a calcium or vitamin D supplement to lower your risk of fractures.  Be given hormone replacement therapy (HRT) to treat symptoms of menopause. Follow these instructions at home: Lifestyle  Do not use any products that contain nicotine or tobacco, such as cigarettes, e-cigarettes, and chewing tobacco. If you need help quitting, ask your health care provider.  Do not use street drugs.  Do not share needles.  Ask your health care provider for help if you need support or information about quitting drugs. Alcohol use  Do not drink alcohol if: ? Your health care provider tells you not to drink. ? You are pregnant, may be pregnant, or are planning to become pregnant.  If you drink alcohol: ? Limit how much you use to 0-1 drink a day. ? Limit intake if you are breastfeeding.  Be aware of how much alcohol is in your drink. In the [place] one drink equals one 12 oz bottle of beer (355 mL), one 5 oz glass of wine (148 mL), or one 1 oz glass of hard liquor (44 mL). General instructions  Schedule regular health, dental, and eye exams.  Stay current with your vaccines.  Tell your health care provider if: ? You often feel depressed. ? You have ever been abused or do not feel safe at home. Summary  Adopting a healthy lifestyle and getting preventive care are important in  promoting health and wellness.  Follow your health care provider's instructions about healthy diet, exercising, and getting tested or screened for diseases.  Follow your health care provider's instructions on monitoring your cholesterol and blood pressure. This information is not intended to replace advice given to you by your health care provider. Make sure you discuss any questions you have with your health care provider. Document Revised: 11/23/2018 Document Reviewed: 11/23/2018 Elsevier Patient Education  2020 [employer].

## 2020-02-13 NOTE — Progress Notes (Signed)
Lindsey Macias 09-26-1976 [id number]    History:   44 yo G1P1 presents for new patient annual exam. 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 07/2019- laser ablation of right greater saphenous vein, no complications, has healed well. Last pap 2016, last mammogram 2016. Mirena IUD 2016, made appointment with office to replace soon. Amenorrheic on Mirena.  2019 - colonoscopy, history of benign colon polyp prior.  Past medical history, past surgical history, family history and social history were all reviewed and documented in the EPIC chart. Has type II diabetes on Metformin, has axiety on Buspar, PCP manages. Also takes eletriptan and omeprazole. One son, and one grandson. Works as a Solicitor.  ROS:  A ROS was performed and pertinent positives and negatives are included.  Exam:  Vitals:   02/13/20 1525  BP: 122/80  Weight: 165 lb (74.8 kg)  Height: 5' 3"  (1.6 m)   Body mass index is 29.23 kg/m.   General appearance:  Normal Thyroid:  Symmetrical, normal in size, without palpable masses or nodularity. Respiratory  Auscultation:  Clear without wheezing or rhonchi Cardiovascular  Auscultation:  Regular rate, without rubs, murmurs or gallops  Edema/varicosities:  Not grossly evident Abdominal  Soft,nontender, without masses, guarding or rebound.  Liver/spleen:  No organomegaly noted  Hernia:  None appreciated  Skin  Inspection:  Grossly normal   Breasts: Examined lying and sitting.     Right: Without masses, retractions, discharge or axillary adenopathy.     Left: Without masses, retractions, discharge or axillary adenopathy. Gentitourinary   Inguinal/mons:  Normal without inguinal adenopathy  External genitalia:  Normal  BUS/Urethra/Skene's glands:  Normal  Vagina:  Normal  Cervix:  IUD strings visualized, polyp at os   Uterus:  Normal in size, shape and contour.  Midline and  mobile  Adnexa/parametria:     Rt: Without masses or tenderness.   Lt: Without masses or tenderness.  Anus and perineum: Normal  Digital rectal exam: Normal sphincter tone without palpated masses or tenderness  Assessment/Plan:  44 y.o. G1P1 for annual exam with no complaints.   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 Servando Snare manages Laser ablation right great saphenous vein - 07/2019 Former smoker- 23 pack year history  Type II diabetes (metformin), GERD (omepazole), allergies, anxiety (buspar)- PCP manages labs and medications  Plan: Pap with HR HPV, new screening guidelines reviewed. Encouraged to schedule [facility] with [facility], phone number given, reviewed importance of annual screen last mammogram 2016. Counseled on importance of continuing to take baby aspirin, expressed understanding. Reviewed importance of daily exercise, healthy diet, vitamin D3 daily, and SBEs .Birth control options reviewed, counseled on risk of estrogen, expressed understanding and desire to continue with Mirena IUD. Will make appointment for Mirena replacement.     Huel Cote [facility], 4:20 PM 02/13/2020

## 2020-02-14 ENCOUNTER — Other Ambulatory Visit: Payer: Self-pay

## 2020-02-14 ENCOUNTER — Encounter (HOSPITAL_COMMUNITY): Payer: Self-pay | Admitting: *Deleted

## 2020-02-14 ENCOUNTER — Emergency Department (HOSPITAL_COMMUNITY)
Admission: EM | Admit: 2020-02-14 | Discharge: 2020-02-15 | Disposition: A | Discharge status: Home / Self Care | Payer: Managed Care, Other (non HMO) | Attending: Emergency Medicine | Admitting: Emergency Medicine

## 2020-02-14 DIAGNOSIS — R109 Unspecified abdominal pain: Secondary | ICD-10-CM | POA: Diagnosis present

## 2020-02-14 DIAGNOSIS — Z5321 Procedure and treatment not carried out due to patient leaving prior to being seen by health care provider: Secondary | ICD-10-CM | POA: Insufficient documentation

## 2020-02-14 DIAGNOSIS — R112 Nausea with vomiting, unspecified: Secondary | ICD-10-CM | POA: Insufficient documentation

## 2020-02-14 LAB — COMPREHENSIVE METABOLIC PANEL
ALT: 25 U/L (ref 0–44)
AST: 23 U/L (ref 15–41)
Albumin: 4.4 g/dL (ref 3.5–5.0)
Alkaline Phosphatase: 103 U/L (ref 38–126)
Anion gap: 10 (ref 5–15)
BUN: 12 mg/dL (ref 6–20)
CO2: 24 mmol/L (ref 22–32)
Calcium: 9.7 mg/dL (ref 8.9–10.3)
Chloride: 97 mmol/L — ABNORMAL LOW (ref 98–111)
Creatinine, Ser: 1.16 mg/dL — ABNORMAL HIGH (ref 0.44–1.00)
GFR calc Af Amer: >60 mL/min (ref >60)
GFR calc non Af Amer: 57 mL/min — ABNORMAL LOW (ref >60)
Glucose, Bld: 110 mg/dL — ABNORMAL HIGH (ref 70–99)
Potassium: 3.6 mmol/L (ref 3.5–5.1)
Sodium: 131 mmol/L — ABNORMAL LOW (ref 135–145)
Total Bilirubin: 0.8 mg/dL (ref 0.3–1.2)
Total Protein: 7 g/dL (ref 6.5–8.1)

## 2020-02-14 LAB — CBC
HCT: 45.3 % (ref 36.0–46.0)
Hemoglobin: 15.3 g/dL — ABNORMAL HIGH (ref 12.0–15.0)
MCH: 30.7 pg (ref 26.0–34.0)
MCHC: 33.8 g/dL (ref 30.0–36.0)
MCV: 91 fL (ref 80.0–100.0)
Platelets: 337 10*3/uL (ref 150–400)
RBC: 4.98 MIL/uL (ref 3.87–5.11)
RDW: 12.5 % (ref 11.5–15.5)
WBC: 7.6 10*3/uL (ref 4.0–10.5)
nRBC: 0 % (ref 0.0–0.2)

## 2020-02-14 LAB — PAP, TP IMAGING W/ HPV RNA, RFLX HPV TYPE 16,18/45: HPV DNA High Risk: NOT DETECTED

## 2020-02-14 LAB — URINALYSIS, ROUTINE W REFLEX MICROSCOPIC
Bilirubin Urine: NEGATIVE
Glucose, UA: NEGATIVE mg/dL
Hgb urine dipstick: NEGATIVE
Ketones, ur: 5 mg/dL — AB
Leukocytes,Ua: NEGATIVE
Nitrite: NEGATIVE
Protein, ur: NEGATIVE mg/dL
Specific Gravity, Urine: 1.034 — ABNORMAL HIGH (ref 1.005–1.030)
pH: 5 (ref 5.0–8.0)

## 2020-02-14 LAB — I-STAT BETA HCG BLOOD, ED (MC, WL, AP ONLY): I-stat hCG, quantitative: <5 m[IU]/mL (ref <5)

## 2020-02-14 LAB — LIPASE, BLOOD: Lipase: 33 U/L (ref 11–51)

## 2020-02-14 MED ORDER — SODIUM CHLORIDE 0.9% FLUSH: 3.0000 mL | Freq: Once | INTRAVENOUS | Status: DC

## 2020-02-14 NOTE — ED Notes (Signed)
Pt has decided to leave

## 2020-02-14 NOTE — ED Triage Notes (Signed)
The pt is c/o mid abd pain since this am with nausea and vomiting.  She has some type mesenteric surgery in 2018  lmp  Birth control

## 2020-02-15 ENCOUNTER — Telehealth: Payer: Self-pay

## 2020-02-15 NOTE — Telephone Encounter (Signed)
Pt called and said that she had a new onset of abdominal pain. She went to the Er but left without being seen given they said it was an 8 hour wait. She is having nausea and reflux and said that she has no appetite. Every time she eats she feels bad  Appt made for pt to be seen  Lindsey Macias

## 2020-02-16 ENCOUNTER — Other Ambulatory Visit: Payer: Self-pay

## 2020-02-16 ENCOUNTER — Ambulatory Visit: Payer: Managed Care, Other (non HMO) | Admitting: Vascular Surgery

## 2020-02-16 ENCOUNTER — Ambulatory Visit (HOSPITAL_COMMUNITY)
Admission: RE | Admit: 2020-02-16 | Discharge: 2020-02-16 | Disposition: A | Discharge status: Home / Self Care | Payer: Managed Care, Other (non HMO) | Source: Ambulatory Visit | Attending: Vascular Surgery | Admitting: Vascular Surgery

## 2020-02-16 ENCOUNTER — Encounter: Payer: Self-pay | Admitting: Vascular Surgery

## 2020-02-16 VITALS — BP 110/76 | HR 78 | Temp 97.6°F | Resp 18 | Ht 63.0 in | Wt 164.7 lb

## 2020-02-16 DIAGNOSIS — K551 Chronic vascular disorders of intestine: Secondary | ICD-10-CM

## 2020-02-16 DIAGNOSIS — K559 Vascular disorder of intestine, unspecified: Secondary | ICD-10-CM

## 2020-02-16 NOTE — Progress Notes (Signed)
Patient ID: Lindsey Macias, female   DOB: Jun 18, 1976, 44 y.o.   MRN: [id number]  Reason for Consult: Follow-up   Referred by Mackie Pai, PA-C  Subjective:     HPI:  Lindsey Macias is a 44 y.o. 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  She continues on aspirin.  A few days ago she developed significant abdominal pain.  She states it was not as bad is the worst pain she had in the past.  She is actually only taken in water for the past 2 days.  She did present to the emergency department but did not wait for CT scan.  She denies any fevers or chills.  Abdominal pain is steady nonradiating has been mostly unrelenting.  She has been more afraid to eat than anything causing pain with eating.  States that she continues to have good bowel function.  Past Medical History:  Diagnosis Date  . Allergy   . Cancer [facility]) 2003   cervical cancer 2002 and 2003. had leep procedure.  Marland Kitchen GERD (gastroesophageal reflux disease)    but actually had h pylori and took meds then got better.  . Varicose veins of both lower extremities    Family History  Problem Relation Age of Onset  . Cancer Father        Brain  . Diabetes Maternal Grandmother    Past Surgical History:  Procedure Laterality Date  . Bunions surgery Right 2010 and 2012  . ENDOVENOUS ABLATION SAPHENOUS VEIN W/ LASER Right 08/02/2019   endovenous laser ablation right greater saphenous vein by Ruta Hinds MD   . SHOULDER SURGERY Right 2012  . TONSILLECTOMY Bilateral 2012    Short Social History:  Social History   Tobacco Use  . Smoking status: Former Smoker    Packs/day: 0.00    Types: Cigarettes    Start date: 11/23/1994    Quit date: 07/28/2017    Years since quitting: 2.5  . Smokeless tobacco: Never Used  Substance Use Topics  . Alcohol use: Yes    Comment: Pt states she drinks 2-3 drinks per year.    Allergies  Allergen Reactions  . Vicodin [Hydrocodone-Acetaminophen] Photosensitivity  . Dust Mite Extract      Sneezing,Watery eyes,Runny nose  . Percocet [Oxycodone-Acetaminophen]   . Sulfa Antibiotics     Upset stomach    Current Outpatient Medications  Medication Sig Dispense Refill  . aspirin EC 81 MG EC tablet Take 1 tablet (81 mg total) by mouth daily.    Marland Kitchen azelastine (ASTELIN) 0.1 % nasal spray PLACE 2 SPRAYS INTO BOTH NOSTRILS 2 (TWO) TIMES DAILY. 30 mL 3  . azelastine (OPTIVAR) 0.05 % ophthalmic solution Place 1 drop into both eyes 2 (two) times daily. 6 mL 11  . cetirizine (ZYRTEC) 10 MG tablet Take 10 mg by mouth daily.    Marland Kitchen eletriptan (RELPAX) 20 MG tablet Take 1 tablet (20 mg total) by mouth as needed for migraine or headache. May repeat in 2 hours if headache persists or recurs. 10 tablet 0  . esomeprazole (NEXIUM) 40 MG capsule Take 1 capsule (40 mg total) by mouth daily. 30 capsule 11  . fluticasone (FLONASE) 50 MCG/ACT nasal spray Place 2 sprays into both nostrils daily. 16 g 1  . mometasone (NASONEX) 50 MCG/ACT nasal spray 2 sprays each nostril twice daily. 17 g 6  . metFORMIN (GLUCOPHAGE) 500 MG tablet Take 1 tablet (500 mg total) by mouth daily with breakfast. (Patient not taking: Reported on  02/16/2020) 30 tablet 3   No current facility-administered medications for this visit.    Review of Systems  Constitutional:  Constitutional negative. HENT: HENT negative.  Eyes: Eyes negative.  Respiratory: Respiratory negative.  Cardiovascular: Cardiovascular negative.  GI: Positive for abdominal pain.  Musculoskeletal: Musculoskeletal negative.  Skin: Skin negative.  Neurological: Neurological negative. Hematologic: Hematologic/lymphatic negative.  Psychiatric: Psychiatric negative.        Objective:  Objective   Vitals:   02/16/20 0858  BP: 110/76  Pulse: 78  Resp: 18  Temp: 97.6 F (36.4 C)  SpO2: 97%  Weight: 164 lb 11.2 oz (74.7 kg)  Height: 5' 3"  (1.6 m)   Body mass index is 29.18 kg/m.  Physical Exam HENT:     Nose:     Comments: Mask in place  Cardiovascular:     Rate and Rhythm: Normal rate.     Pulses:          Radial pulses are 2+ on the right side and 2+ on the left side.       Popliteal pulses are 2+ on the right side and 2+ on the left side.  Pulmonary:     Effort: Pulmonary effort is normal.  Abdominal:     General: Abdomen is flat.     Palpations: Abdomen is soft.     Comments: Mild tenderness to palpation epigastrium  Musculoskeletal:        General: No swelling. Normal range of motion.  Skin:    General: Skin is warm and dry.     Capillary Refill: Capillary refill takes less than 2 seconds.  Neurological:     General: No focal deficit present.     Mental Status: She is alert.  Psychiatric:        Mood and Affect: Mood normal.        Behavior: Behavior normal.        Thought Content: Thought content normal.        Judgment: Judgment normal.     Data: I have independently interpreted her mesenteric duplex.  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  There is no significant SMA or inferior mesenteric artery stenosis.     Assessment/Plan:     44 year old female with previous history of SMA dissection that seem to have mostly healed patient was doing very well.  This time she now has recurrent epigastric pain which is not as bad as the previous initial pain with the SMA dissection.  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  I have offered her to either go to the emergency department and be scanned and evaluated there or if she thinks she can take at least protein shakes and remain hydrated we can get her scan as an outpatient.  She is chosen this route and I will see her back in 1 week.  Certainly if her pain worsens or she cannot tolerate p.o. intake she will need to be evaluated the emergency department where she will need to stay this time and get a  CT scan.  If this is indeed from celiac artery dissection she is likely to need rheumatologic work-up in the future.     Waynetta Sandy MD [facility]

## 2020-02-21 ENCOUNTER — Other Ambulatory Visit: Payer: Self-pay

## 2020-02-21 ENCOUNTER — Ambulatory Visit (HOSPITAL_COMMUNITY)
Admission: RE | Admit: 2020-02-21 | Discharge: 2020-02-21 | Disposition: A | Discharge status: Home / Self Care | Payer: Managed Care, Other (non HMO) | Source: Ambulatory Visit | Attending: Vascular Surgery | Admitting: Vascular Surgery

## 2020-02-21 DIAGNOSIS — K559 Vascular disorder of intestine, unspecified: Secondary | ICD-10-CM | POA: Diagnosis present

## 2020-02-21 MED ORDER — IOHEXOL 350 MG/ML SOLN
75.0000 mL | Freq: Once | INTRAVENOUS | Status: AC | PRN
  Administered 2020-02-21: 75 mL via INTRAVENOUS

## 2020-02-22 ENCOUNTER — Telehealth (HOSPITAL_COMMUNITY): Payer: Self-pay

## 2020-02-22 NOTE — Telephone Encounter (Signed)

## 2020-02-23 ENCOUNTER — Other Ambulatory Visit: Payer: Self-pay

## 2020-02-23 ENCOUNTER — Encounter: Payer: Self-pay | Admitting: Obstetrics and Gynecology

## 2020-02-23 ENCOUNTER — Ambulatory Visit: Payer: Managed Care, Other (non HMO) | Admitting: Vascular Surgery

## 2020-02-23 ENCOUNTER — Encounter: Payer: Self-pay | Admitting: Vascular Surgery

## 2020-02-23 ENCOUNTER — Ambulatory Visit (INDEPENDENT_AMBULATORY_CARE_PROVIDER_SITE_OTHER): Payer: Managed Care, Other (non HMO) | Admitting: Obstetrics and Gynecology

## 2020-02-23 VITALS — BP 116/74

## 2020-02-23 VITALS — BP 107/74 | HR 83 | Temp 97.6°F | Resp 20 | Ht 63.0 in | Wt 166.0 lb

## 2020-02-23 DIAGNOSIS — Z30433 Encounter for removal and reinsertion of intrauterine contraceptive device: Secondary | ICD-10-CM | POA: Diagnosis not present

## 2020-02-23 DIAGNOSIS — I774 Celiac artery compression syndrome: Secondary | ICD-10-CM | POA: Diagnosis not present

## 2020-02-23 DIAGNOSIS — K551 Chronic vascular disorders of intestine: Secondary | ICD-10-CM | POA: Diagnosis not present

## 2020-02-23 NOTE — Progress Notes (Addendum)
   Lindsey Macias  [birth year] [id number]  HPI The patient is a 44 y.o. G1P0001 who presents today for Mirena IUD removal and insertion of new Mirena IUD.  She has had her Mirena IUD since 2016 and is due for removal.  Indicates her last insertion was somewhat difficult.  I counseled her on the Mirena IUD including in the initial period of time after insertion to expect cramping and/or abnormal bleeding, especially in the 1st 3-6 months use.  After this time period the period can get very light or some women become amenorrheic.  Insertion risks include infection, uterine perforation and device migration into the abdomen which would require surgical removal, or the device can become dislodged causing pain or fall out of which would preclude effective contraception.  Recommend self string checks on a periodic basis.  If unable to feel, she should contact us right away.  Patient provided written consent to proceed.  BP 116/74   LMP  (LMP Unknown)   Procedure note IUD removal Bimanual exam was performed indicating midline uterus.  The cervix was visualized with a Graves speculum.  The cervix was easily visualized.  IUD strings were visualized and grasped with a [place] forceps, and the IUD was removed in its entirety without difficulty.  The intact device was visually confirmed by patient, staff, and myself.   IUD insertion The cervix was cleansed with a Betadine swab.  The anterior lip of the cervix was grasped with an Allis clamp.  The cervix was gently dilated with the cervical os finder.  Endometrial pipelle was used to obtain a sound length of 8 cm.  Maintaining sterility, the flange on the IUD insertion tube was set to the appropriate length and the insertion tube was inserted into the cervix and endometrial cavity with minor resistance at the internal os, but the device did move past that with minimal difficulty until gentle fundal resistance was met.  The arms of the IUD were deployed and the Mirena  was inserted without difficulty. The strings were trimmed to a length of 3 cm.  The patient tolerated the procedure well without any complication.  Device lot number [id number], expiration 06/2022  Bedside transvaginal ultrasound was performed to confirm that the device was in the uterus. The IUD was clearly visible within the endometrial cavity in an appropriate appearing position.   Caryn Bee present for the exam, procedure, and ultrasound.    Joseph Pierini MD 02/23/20

## 2020-02-23 NOTE — Progress Notes (Signed)
Patient ID: Lindsey Macias, female   DOB: [birth year], 44 y.o.   MRN: [id number]  Reason for Consult: Follow-up   Referred by Mackie Pai, PA-C  Subjective:     HPI:  Lindsey Macias is a 44 y.o. female history of SMA dissection.  She has stayed on aspirin.  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  She is now eating having normal bowel function really does not have any abdominal complaints today.  Past Medical History:  Diagnosis Date  . Allergy   . Cancer [facility]) 2003   cervical cancer 2002 and 2003. had leep procedure.  Marland Kitchen GERD (gastroesophageal reflux disease)    but actually had h pylori and took meds then got better.  . Varicose veins of both lower extremities    Family History  Problem Relation Age of Onset  . Cancer Father        Brain  . Diabetes Maternal Grandmother    Past Surgical History:  Procedure Laterality Date  . Bunions surgery Right 2010 and 2012  . ENDOVENOUS ABLATION SAPHENOUS VEIN W/ LASER Right 08/02/2019   endovenous laser ablation right greater saphenous vein by Ruta Hinds MD   . SHOULDER SURGERY Right 2012  . TONSILLECTOMY Bilateral 2012    Short Social History:  Social History   Tobacco Use  . Smoking status: Former Smoker    Packs/day: 0.00    Types: Cigarettes    Start date: 11/23/1994    Quit date: 07/28/2017    Years since quitting: 2.5  . Smokeless tobacco: Never Used  Substance Use Topics  . Alcohol use: Yes    Comment: Pt states she drinks 2-3 drinks per year.    Allergies  Allergen Reactions  . Vicodin [Hydrocodone-Acetaminophen] Photosensitivity  . Dust Mite Extract     Sneezing,Watery eyes,Runny nose  . Percocet [Oxycodone-Acetaminophen]   . Sulfa Antibiotics     Upset stomach    Current Outpatient Medications  Medication Sig Dispense Refill  . aspirin EC 81 MG EC tablet Take 1 tablet (81 mg total) by mouth daily.    Marland Kitchen azelastine (ASTELIN) 0.1 % nasal  spray PLACE 2 SPRAYS INTO BOTH NOSTRILS 2 (TWO) TIMES DAILY. 30 mL 3  . azelastine (OPTIVAR) 0.05 % ophthalmic solution Place 1 drop into both eyes 2 (two) times daily. 6 mL 11  . cetirizine (ZYRTEC) 10 MG tablet Take 10 mg by mouth daily.    Marland Kitchen eletriptan (RELPAX) 20 MG tablet Take 1 tablet (20 mg total) by mouth as needed for migraine or headache. May repeat in 2 hours if headache persists or recurs. 10 tablet 0  . esomeprazole (NEXIUM) 40 MG capsule Take 1 capsule (40 mg total) by mouth daily. 30 capsule 11  . fluticasone (FLONASE) 50 MCG/ACT nasal spray Place 2 sprays into both nostrils daily. 16 g 1  . mometasone (NASONEX) 50 MCG/ACT nasal spray 2 sprays each nostril twice daily. 17 g 6  . metFORMIN (GLUCOPHAGE) 500 MG tablet Take 1 tablet (500 mg total) by mouth daily with breakfast. (Patient not taking: Reported on 02/16/2020) 30 tablet 3   No current facility-administered medications for this visit.    Review of Systems  Constitutional:  Constitutional negative. HENT: HENT negative.  Eyes: Eyes negative.  Respiratory: Respiratory negative.  Cardiovascular: Cardiovascular negative.  GI: Gastrointestinal negative.  Musculoskeletal: Musculoskeletal negative.  Skin: Skin negative.  Neurological: Neurological negative. Hematologic: Hematologic/lymphatic negative.  Psychiatric: Psychiatric negative.  Objective:  Objective   Vitals:   02/23/20 1205  BP: 107/74  Pulse: 83  Resp: 20  Temp: 97.6 F (36.4 C)  SpO2: 98%  Weight: 166 lb (75.3 kg)  Height: 5' 3"  (1.6 m)   Body mass index is 29.41 kg/m.  Physical Exam HENT:     Head: Normocephalic.     Nose: Nose normal.  Eyes:     Pupils: Pupils are equal, round, and reactive to light.  Cardiovascular:     Rate and Rhythm: Normal rate.  Pulmonary:     Effort: Pulmonary effort is normal.  Abdominal:     General: Abdomen is flat. There is no distension.     Palpations: Abdomen is soft. There is no mass.   Musculoskeletal:        General: No swelling. Normal range of motion.  Skin:    General: Skin is warm and dry.     Capillary Refill: Capillary refill takes less than 2 seconds.  Neurological:     General: No focal deficit present.     Mental Status: She is alert.  Psychiatric:        Mood and Affect: Mood normal.        Behavior: Behavior normal.        Thought Content: Thought content normal.        Judgment: Judgment normal.     Data: CT IMPRESSION: No acute vascular abnormality.  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  Positive remodeling of the SMA origin, with complete resolution of prior dissection changes.  Redemonstration of right middle lobe pulmonary nodule measuring approximately 12 mm. Despite relative unchanged size since October 2018, follow-up with primary care and/or pulmonary is indicated to determine a suitable surveillance strategy, as a mesenchymal tumor could have this appearance in addition to benign nodules.      We reviewed the patient's CT scan together in the office.   Assessment/Plan:     44 year old female history of SMA dissection.  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  She is back to eating does not appear to have problems.  Given that she has a one-time SMA dissection that has healed almost completely would not recommend rheumatologic work-up.  We will follow-up in 1 year with repeat mesenteric duplex.  If she has issues prior to this I can certainly see her sooner.     Waynetta Sandy MD [facility]

## 2020-02-26 ENCOUNTER — Encounter: Payer: Self-pay | Admitting: Gynecology

## 2020-02-27 ENCOUNTER — Other Ambulatory Visit: Payer: Self-pay | Admitting: *Deleted

## 2020-02-27 DIAGNOSIS — K551 Chronic vascular disorders of intestine: Secondary | ICD-10-CM

## 2020-04-10 ENCOUNTER — Ambulatory Visit: Payer: Managed Care, Other (non HMO) | Admitting: Medical

## 2020-04-10 DIAGNOSIS — Z0289 Encounter for other administrative examinations: Secondary | ICD-10-CM

## 2020-04-30 ENCOUNTER — Telehealth: Payer: Self-pay

## 2020-04-30 NOTE — Telephone Encounter (Signed)
Telephone call received from patient stating her personal trainer is requesting a letter of release from provider before he begins working with her. Advised will discuss with provider when back in office regarding recommendations. Voiced understanding. Minette Brine, RN

## 2020-05-03 NOTE — Telephone Encounter (Signed)
Per Dr. Donzetta Matters patient may exercise without restrictions. Patient was informed and letter created for pick up in front office. Pt verbalized understanding. Minette Brine, RN

## 2020-06-13 ENCOUNTER — Ambulatory Visit (HOSPITAL_BASED_OUTPATIENT_CLINIC_OR_DEPARTMENT_OTHER)
Admission: RE | Admit: 2020-06-13 | Discharge: 2020-06-13 | Disposition: A | Discharge status: Home / Self Care | Payer: Managed Care, Other (non HMO) | Source: Ambulatory Visit | Attending: Medical | Admitting: Medical

## 2020-06-13 ENCOUNTER — Ambulatory Visit: Payer: Managed Care, Other (non HMO) | Admitting: Medical

## 2020-06-13 ENCOUNTER — Other Ambulatory Visit: Payer: Self-pay

## 2020-06-13 VITALS — BP 121/74 | HR 106 | Temp 97.7°F | Resp 18 | Ht 63.0 in | Wt 169.0 lb

## 2020-06-13 DIAGNOSIS — M25572 Pain in left ankle and joints of left foot: Secondary | ICD-10-CM | POA: Diagnosis present

## 2020-06-13 DIAGNOSIS — M898X6 Other specified disorders of bone, lower leg: Secondary | ICD-10-CM

## 2020-06-13 IMAGING — DX DG ANKLE COMPLETE 3+V*L*
3 series · 3 of 3 positions shown · non-contrast
Comparison: None.

CLINICAL DATA: Left ankle pain for several weeks without known
injury.

EXAM:
LEFT ANKLE COMPLETE - 3+ VIEW

[ankle ap]
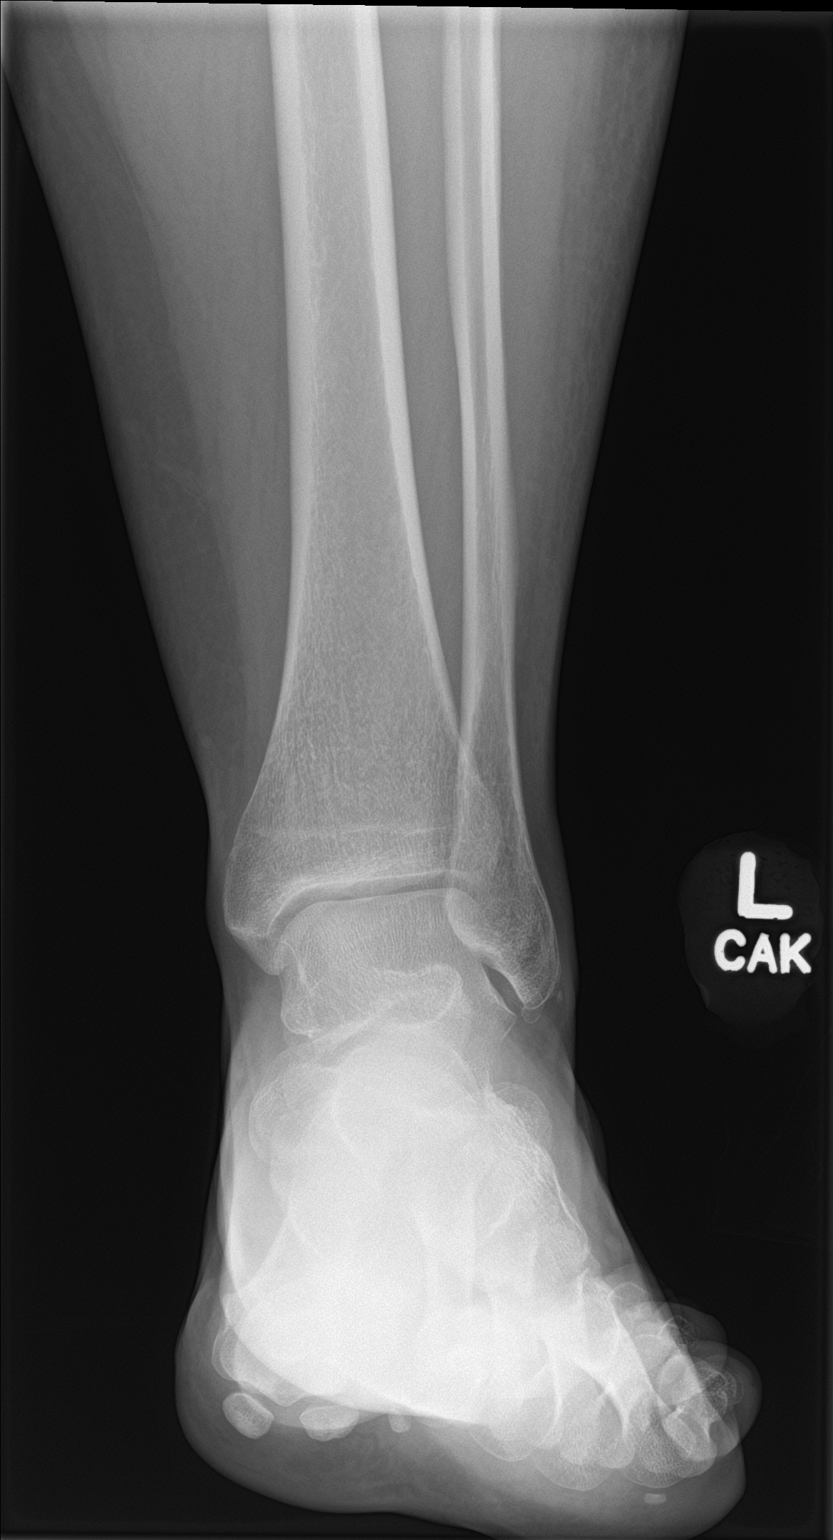

[ankle obl]
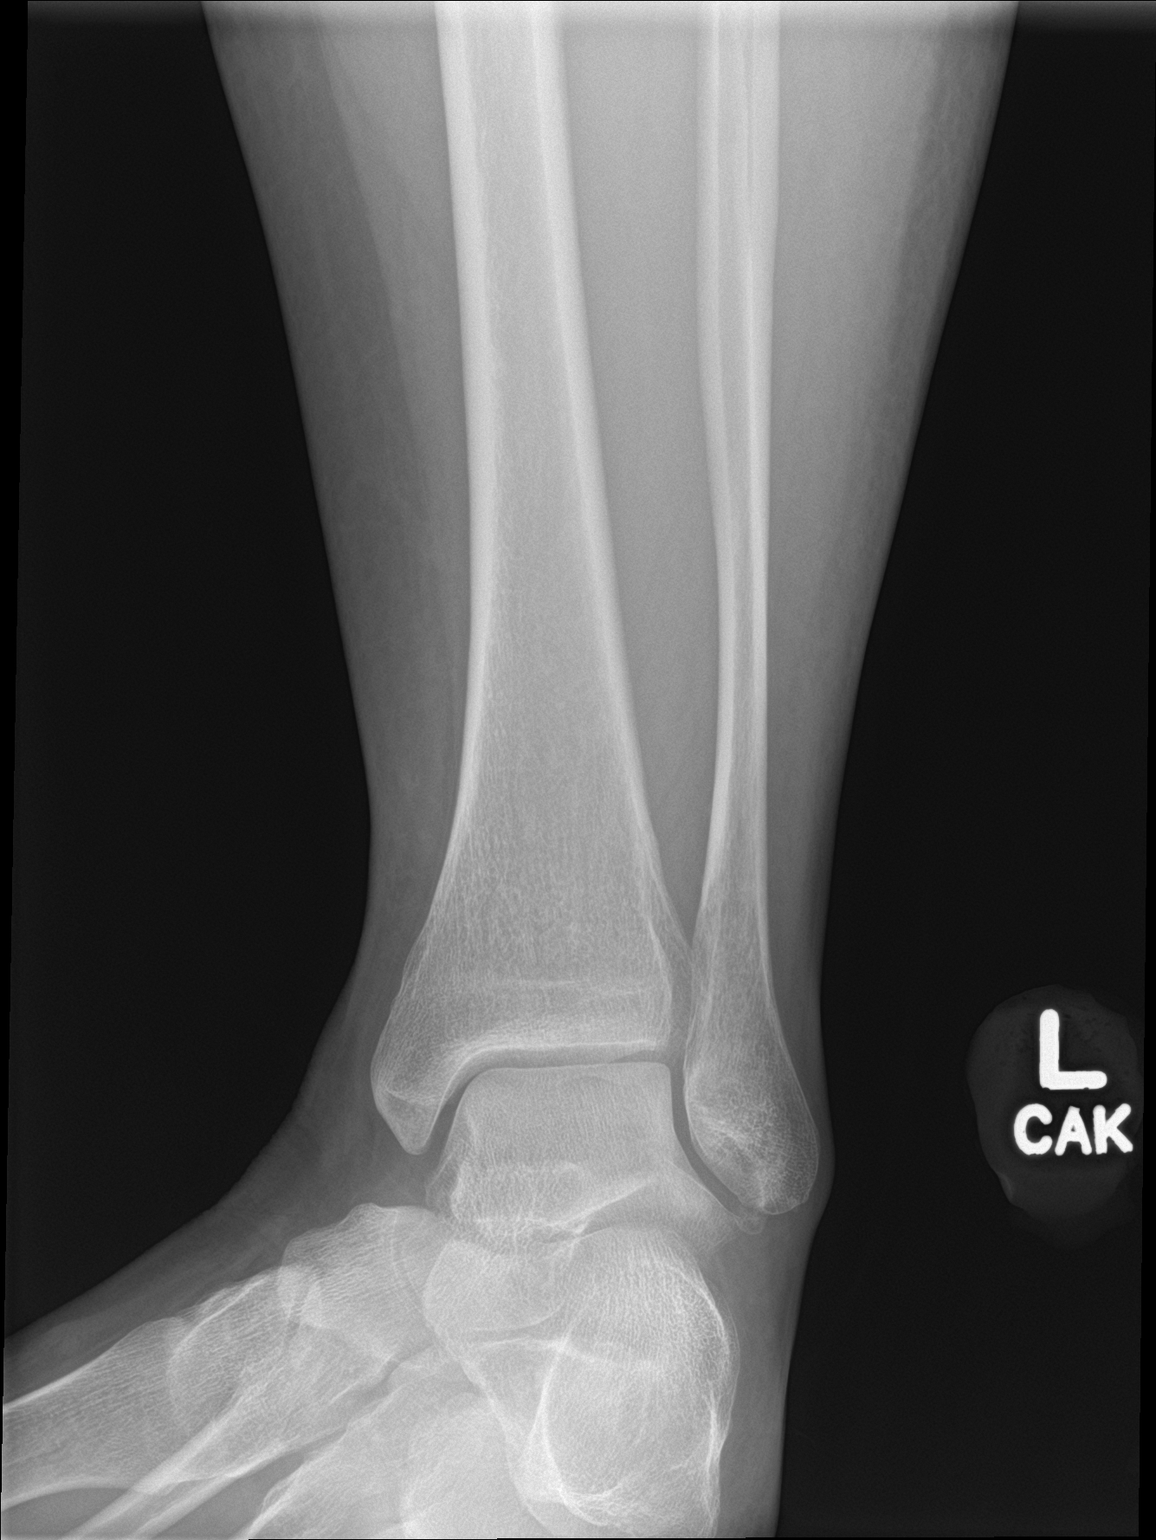

[ankle lat]
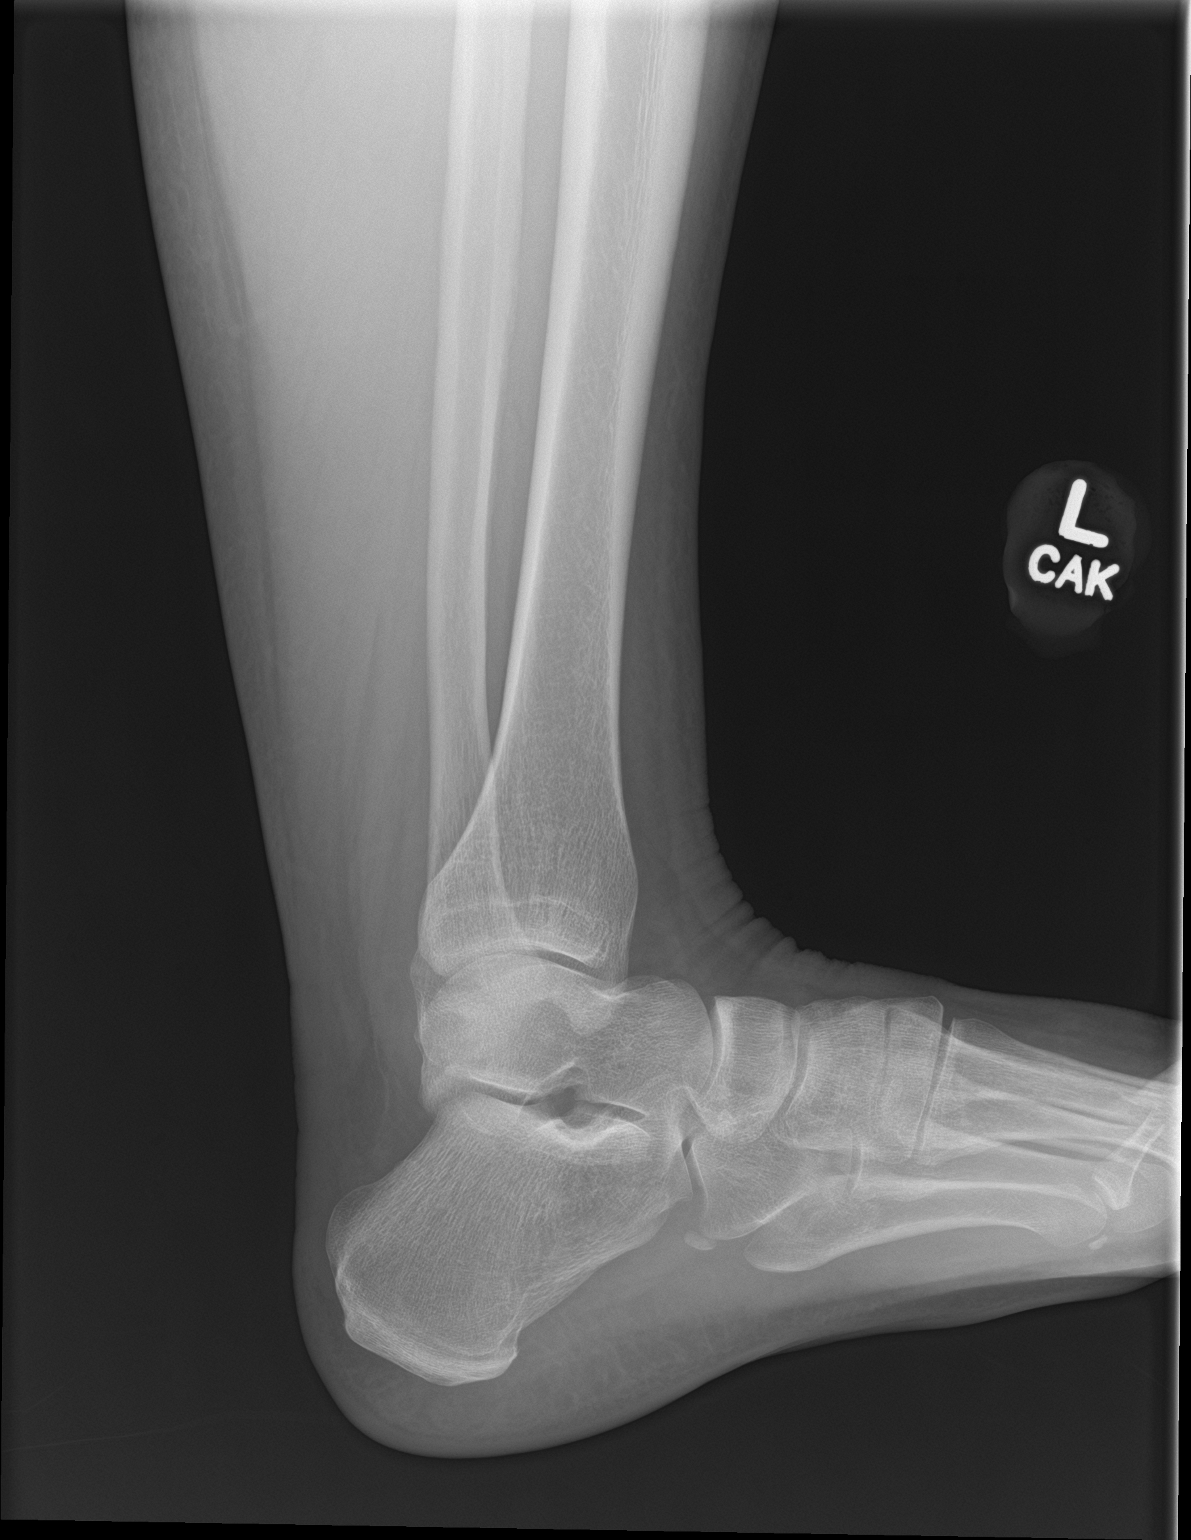

[3 of 3 positions shown; findings below may reference images not displayed]

FINDINGS: There is no evidence of fracture, dislocation, or joint effusion.
There is no evidence of arthropathy or other focal bone abnormality.
Soft tissues are unremarkable.
IMPRESSION: Negative.

## 2020-06-13 NOTE — Progress Notes (Signed)
° °  Subjective:    Patient ID: Lindsey Macias, female    DOB: Jun 26, 1976, 44 y.o.   MRN: [id number]  HPI  Pt in states recent medial aspect nankle pain with pain radiating to medial 1/3 distal tib/fib area. Pain on and off random. Mostly pain on walking. Will be intense stabbing pain that makes her want to rest. When one time when walking thought she heard sound similar to twig breaking.  At rest minimal twinge/about to hurt pain in ankle. No med for pain.  She states pain started daily one week ago. But two other time quick sharp stab pain in past.     Review of Systems  Musculoskeletal:       See hpi.       Objective:   Physical Exam   General- No acute distress. Pleasant patient. Neck- Full range of motion, no jvd Lungs- Clear, even and unlabored. Heart- regular rate and rhythm. Neurologic- CNII- XII grossly intact.  Left lower ext- medial ankle area no tenderness. No swelling. Medial tibia area distal 1/3 area faint mild slight tenderness.     Assessment & Plan:  For your ankle and tibia area pain will get xrays. Might consider compression sleeve if xray results normal.   Can use low dose tylenol and/or low dose ibuprofen otc. 200-400 mg if needed.  Follow up 7-10 days or as needed. Could also send me my chart update. If pain lingers might refer to sport med.  Mackie Pai, PA-C   Time spent with patient today was  20 minutes which consisted of chart review, discussing diagnosis, work up, treatment and documentation.

## 2020-06-13 NOTE — Patient Instructions (Addendum)
For your ankle and tibia area pain will get xrays. Might consider compression sleeve if xray results normal.   Can use low dose tylenol and/or low dose ibuprofen otc. 200-400 mg if needed.  Follow up 7-10 days or as needed. Could also send me my chart update. If pain lingers might refer to sport med.

## 2020-08-05 ENCOUNTER — Telehealth (INDEPENDENT_AMBULATORY_CARE_PROVIDER_SITE_OTHER): Payer: PRIVATE HEALTH INSURANCE | Admitting: Medical

## 2020-08-05 ENCOUNTER — Other Ambulatory Visit: Payer: Self-pay

## 2020-08-05 VITALS — BP 118/70 | HR 90 | Temp 98.1°F | Resp 18 | Ht 63.0 in | Wt 168.4 lb

## 2020-08-05 DIAGNOSIS — J320 Chronic maxillary sinusitis: Secondary | ICD-10-CM

## 2020-08-05 DIAGNOSIS — J309 Allergic rhinitis, unspecified: Secondary | ICD-10-CM | POA: Diagnosis not present

## 2020-08-05 DIAGNOSIS — K219 Gastro-esophageal reflux disease without esophagitis: Secondary | ICD-10-CM

## 2020-08-05 MED ORDER — METHYLPREDNISOLONE 4 MG PO TABS: ORAL_TABLET | ORAL | 0 refills | Status: DC

## 2020-08-05 MED ORDER — AZITHROMYCIN 250 MG PO TABS: ORAL_TABLET | ORAL | 0 refills | Status: DC

## 2020-08-05 MED ORDER — AZELASTINE HCL 0.1 % NA SOLN: NASAL | 3 refills | Status: DC

## 2020-08-05 MED ORDER — MOMETASONE FUROATE 50 MCG/ACT NA SUSP: NASAL | 11 refills | Status: DC

## 2020-08-05 MED ORDER — BECLOMETHASONE DIPROP MONOHYD 42 MCG/SPRAY NA SUSP: 2.0000 | Freq: Two times a day (BID) | NASAL | 12 refills | Status: DC

## 2020-08-05 MED ORDER — ESOMEPRAZOLE MAGNESIUM 40 MG PO CPDR: 40.0000 mg | DELAYED_RELEASE_CAPSULE | Freq: Every day | ORAL | 11 refills | Status: DC

## 2020-08-05 MED FILL — AZITHROMYCIN 250 MG TABS: 250 | 5 days supply | Qty: 6 | Fill #0

## 2020-08-05 MED FILL — AZELASTINE HCL 137 MCG/SPRA: 137 | 25 days supply | Qty: 30 | Fill #0

## 2020-08-05 MED FILL — ESOMEPRAZOLE MAG DR 40 MG C: 40 | 30 days supply | Qty: 30 | Fill #0

## 2020-08-05 NOTE — Patient Instructions (Signed)
History of chronic allergic rhinitis with recent flare and probable sinus infection.  Prescribed 4-day taper dose of methylprednisone, refilled Astelin, gave recent prescription of mometasone since you found the much better price/$50 online.  Prescription of azithromycin sent to pharmacy.  Refilled Nexium for control GERD.  Follow-up in 3 to 4 weeks or as needed.

## 2020-08-05 NOTE — Progress Notes (Signed)
Subjective:    Patient ID: Lindsey Macias, female    DOB: 1976-08-08, 44 y.o.   MRN: [id number]  HPI  Pt in with some sinus pressure for 2 weeks.   Pt has hx of allergies in the past. In the past she has done well with nasonex. Pt has failed astelin and flonase combination in past. Pt recently pt has been on tylenol sinus and HA.  Patient states that historically she has done well with mometasone but increased price due to her insurance not covering.  Last time she checked she stated price was around $200 for 1 bottle.  Pt has hx of allergies with occassioanal sinus infection.  When she blows nose getting green mucus. No fever, no chills, no sweats or body aches. No loss of taste or smell.  She does describe bilateral maxillary sinus pressure with some ethmoid sinus pressure as well.  Review of Systems  Constitutional: Negative for chills, fatigue and fever.  HENT: Positive for congestion, postnasal drip, sinus pressure and sinus pain.   Respiratory: Negative for cough, chest tightness and shortness of breath.   Cardiovascular: Negative for chest pain and palpitations.  Gastrointestinal: Negative for abdominal pain.       History of GERD.  Controlled with Nexium.    Past Medical History:  Diagnosis Date  . Allergy   . Cancer [facility]) 2003   cervical cancer 2002 and 2003. had leep procedure.  Marland Kitchen GERD (gastroesophageal reflux disease)    but actually had h pylori and took meds then got better.  . Varicose veins of both lower extremities      Social History   Socioeconomic History  . Marital status: Divorced    Spouse name: Not on file  . Number of children: Not on file  . Years of education: Not on file  . Highest education level: Not on file  Occupational History  . Not on file  Tobacco Use  . Smoking status: Former Smoker    Packs/day: 0.00    Types: Cigarettes    Start date: 11/23/1994    Quit date: 07/28/2017    Years since quitting: 3.0  . Smokeless tobacco: Never  Used  Vaping Use  . Vaping Use: Never used  Substance and Sexual Activity  . Alcohol use: Yes    Comment: Pt states she drinks 2-3 drinks per year.  . Drug use: No  . Sexual activity: Not Currently    Comment: intercourse age 38, more than 5 sexual partners  Other Topics Concern  . Not on file  Social History Narrative  . Not on file   Social Determinants of Health   Financial Resource Strain:   . Difficulty of Paying Living Expenses: Not on file  Food Insecurity:   . Worried About Charity fundraiser in the Last Year: Not on file  . Ran Out of Food in the Last Year: Not on file  Transportation Needs:   . Lack of Transportation (Medical): Not on file  . Lack of Transportation (Non-Medical): Not on file  Physical Activity:   . Days of Exercise per Week: Not on file  . Minutes of Exercise per Session: Not on file  Stress:   . Feeling of Stress : Not on file  Social Connections:   . Frequency of Communication with Friends and Family: Not on file  . Frequency of Social Gatherings with Friends and Family: Not on file  . Attends Religious Services: Not on file  . Active Member  of Clubs or Organizations: Not on file  . Attends Archivist Meetings: Not on file  . Marital Status: Not on file  Intimate Partner Violence:   . Fear of Current or Ex-Partner: Not on file  . Emotionally Abused: Not on file  . Physically Abused: Not on file  . Sexually Abused: Not on file    Past Surgical History:  Procedure Laterality Date  . Bunions surgery Right 2010 and 2012  . ENDOVENOUS ABLATION SAPHENOUS VEIN W/ LASER Right 08/02/2019   endovenous laser ablation right greater saphenous vein by Ruta Hinds MD   . SHOULDER SURGERY Right 2012  . TONSILLECTOMY Bilateral 2012    Family History  Problem Relation Age of Onset  . Cancer Father        Brain  . Diabetes Maternal Grandmother     Allergies  Allergen Reactions  . Vicodin [Hydrocodone-Acetaminophen] Photosensitivity    . Dust Mite Extract     Sneezing,Watery eyes,Runny nose  . Percocet [Oxycodone-Acetaminophen]   . Sulfa Antibiotics     Upset stomach    Current Outpatient Medications on File Prior to Visit  Medication Sig Dispense Refill  . aspirin EC 81 MG EC tablet Take 1 tablet (81 mg total) by mouth daily.    Marland Kitchen azelastine (OPTIVAR) 0.05 % ophthalmic solution Place 1 drop into both eyes 2 (two) times daily. 6 mL 11  . cetirizine (ZYRTEC) 10 MG tablet Take 10 mg by mouth daily.    Marland Kitchen eletriptan (RELPAX) 20 MG tablet Take 1 tablet (20 mg total) by mouth as needed for migraine or headache. May repeat in 2 hours if headache persists or recurs. 10 tablet 0  . fluticasone (FLONASE) 50 MCG/ACT nasal spray Place 2 sprays into both nostrils daily. (Patient not taking: Reported on 08/05/2020) 16 g 1  . levonorgestrel (MIRENA) 20 MCG/24HR IUD 1 each by Intrauterine route once.    . metFORMIN (GLUCOPHAGE) 500 MG tablet Take 1 tablet (500 mg total) by mouth daily with breakfast. (Patient not taking: Reported on 02/23/2020) 30 tablet 3   No current facility-administered medications on file prior to visit.    BP 118/70   Pulse 90   Temp 98.1 F (36.7 C) (Oral)   Resp 18   Ht 5' 3"  (1.6 m)   Wt 168 lb 6.4 oz (76.4 kg)   SpO2 98%   BMI 29.83 kg/m      Objective:   Physical Exam  General  Mental Status - Alert. General Appearance - Well groomed. Not in acute distress.  Skin Rashes- No Rashes.  HEENT Head- Normal. Ear Auditory Canal - Left- Normal. Right - Normal.Tympanic Membrane- Left- Normal. Right- Normal. Eye Sclera/Conjunctiva- Left- Normal. Right- Normal. Nose & Sinuses Nasal Mucosa- Left-  Boggy and Congested. Right-  Boggy and  Congested.Bilateral maxillary and ethmoid sinus pressure bilaterally.  No frontal sinus pressure. Mouth & Throat Lips: Upper Lip- Normal: no dryness, cracking, pallor, cyanosis, or vesicular eruption. Lower Lip-Normal: no dryness, cracking, pallor, cyanosis or  vesicular eruption. Buccal Mucosa- Bilateral- No Aphthous ulcers. Oropharynx- No Discharge or Erythema. Tonsils: Characteristics- Bilateral- No Erythema or Congestion. Size/Enlargement- Bilateral- No enlargement. Discharge- bilateral-None.  Neck Neck- Supple. No Masses.   Chest and Lung Exam Auscultation: Breath Sounds:-Clear even and unlabored.  Cardiovascular Auscultation:Rythm- Regular, rate and rhythm. Murmurs & Other Heart Sounds:Ausculatation of the heart reveal- No Murmurs.  Lymphatic Head & Neck General Head & Neck Lymphatics: Bilateral: Description- No Localized lymphadenopathy.  Assessment & Plan:  History of chronic allergic rhinitis with recent flare and probable sinus infection.  Prescribed 4-day taper dose of methylprednisone, refilled Astelin, gave recent prescription of mometasone since you found the much better price/$50 online.  Prescription of azithromycin sent to pharmacy.  Refilled Nexium for control GERD.  Follow-up in 3 to 4 weeks or as needed.  Time spent with patient today was  20 minutes which consisted of chart revdiew, discussing diagnosis, work up treatment and documentation.

## 2020-09-17 ENCOUNTER — Telehealth: Payer: Self-pay | Admitting: Medical

## 2020-09-17 ENCOUNTER — Other Ambulatory Visit: Payer: Self-pay

## 2020-09-17 ENCOUNTER — Ambulatory Visit (INDEPENDENT_AMBULATORY_CARE_PROVIDER_SITE_OTHER): Payer: PRIVATE HEALTH INSURANCE | Admitting: Medical

## 2020-09-17 VITALS — BP 116/76 | HR 85 | Temp 98.6°F | Resp 18 | Ht 63.0 in | Wt 175.8 lb

## 2020-09-17 DIAGNOSIS — M79631 Pain in right forearm: Secondary | ICD-10-CM

## 2020-09-17 DIAGNOSIS — M25531 Pain in right wrist: Secondary | ICD-10-CM

## 2020-09-17 MED ORDER — MOMETASONE FUROATE 50 MCG/ACT NA SUSP: NASAL | 11 refills | Status: DC

## 2020-09-17 MED ORDER — AZELASTINE HCL 0.1 % NA SOLN: NASAL | 3 refills | Status: DC

## 2020-09-17 MED ORDER — ESOMEPRAZOLE MAGNESIUM 40 MG PO CPDR: 40.0000 mg | DELAYED_RELEASE_CAPSULE | Freq: Every day | ORAL | 3 refills | Status: DC

## 2020-09-17 NOTE — Progress Notes (Signed)
Subjective:    Patient ID: Lindsey Macias, female    DOB: [birth year], 44 y.o.   MRN: [id number]  HPI  Pt in with rt side wrist/distal 1/3 ulna area pain. Pt describes pronation and supination was causing pain in past.  Pt states overall had pain for 3 weeks. About 2 weeks ago is when she had pronation/supination pain. Last 4 days she was resting forearm and wrist area pain is slowly improving.  Pt thinks some pain that is radiating up toward her rt elbow.  No injury or fall.  Pt has ergonomic key board, mouse and chair at work.  6 night ago pain woke her up 4 times.  Pt is using ibuprofen 600 mg and that is adequate.  Pt states heart burn/reflux controlled with nexium 40 mg.  Allergies controlled with nasonex and asteling controlling  allergies.     Review of Systems  Constitutional: Negative for chills, fatigue and fever.  Respiratory: Negative for chest tightness, shortness of breath and wheezing.   Cardiovascular: Negative for chest pain and palpitations.  Musculoskeletal: Negative for back pain, myalgias and neck stiffness.  Skin: Negative for rash.  Neurological: Negative for dizziness, speech difficulty, weakness and light-headedness.  Hematological: Negative for adenopathy. Does not bruise/bleed easily.  Psychiatric/Behavioral: Negative for behavioral problems and suicidal ideas. The patient is not nervous/anxious.     Past Medical History:  Diagnosis Date  . Allergy   . Cancer [facility]) 2003   cervical cancer 2002 and 2003. had leep procedure.  Marland Kitchen GERD (gastroesophageal reflux disease)    but actually had h pylori and took meds then got better.  . Varicose veins of both lower extremities      Social History   Socioeconomic History  . Marital status: Divorced    Spouse name: Not on file  . Number of children: Not on file  . Years of education: Not on file  . Highest education level: Not on file  Occupational History  . Not on file  Tobacco Use  . Smoking  status: Former Smoker    Packs/day: 0.00    Types: Cigarettes    Start date: 11/23/1994    Quit date: 07/28/2017    Years since quitting: 3.1  . Smokeless tobacco: Never Used  Vaping Use  . Vaping Use: Never used  Substance and Sexual Activity  . Alcohol use: Yes    Comment: Pt states she drinks 2-3 drinks per year.  . Drug use: No  . Sexual activity: Not Currently    Comment: intercourse age 37, more than 5 sexual partners  Other Topics Concern  . Not on file  Social History Narrative  . Not on file   Social Determinants of Health   Financial Resource Strain:   . Difficulty of Paying Living Expenses: Not on file  Food Insecurity:   . Worried About Charity fundraiser in the Last Year: Not on file  . Ran Out of Food in the Last Year: Not on file  Transportation Needs:   . Lack of Transportation (Medical): Not on file  . Lack of Transportation (Non-Medical): Not on file  Physical Activity:   . Days of Exercise per Week: Not on file  . Minutes of Exercise per Session: Not on file  Stress:   . Feeling of Stress : Not on file  Social Connections:   . Frequency of Communication with Friends and Family: Not on file  . Frequency of Social Gatherings with Friends and Family: Not  on file  . Attends Religious Services: Not on file  . Active Member of Clubs or Organizations: Not on file  . Attends Archivist Meetings: Not on file  . Marital Status: Not on file  Intimate Partner Violence:   . Fear of Current or Ex-Partner: Not on file  . Emotionally Abused: Not on file  . Physically Abused: Not on file  . Sexually Abused: Not on file    Past Surgical History:  Procedure Laterality Date  . Bunions surgery Right 2010 and 2012  . ENDOVENOUS ABLATION SAPHENOUS VEIN W/ LASER Right 08/02/2019   endovenous laser ablation right greater saphenous vein by Ruta Hinds MD   . SHOULDER SURGERY Right 2012  . TONSILLECTOMY Bilateral 2012    Family History  Problem Relation  Age of Onset  . Cancer Father        Brain  . Diabetes Maternal Grandmother     Allergies  Allergen Reactions  . Vicodin [Hydrocodone-Acetaminophen] Photosensitivity  . Dust Mite Extract     Sneezing,Watery eyes,Runny nose  . Percocet [Oxycodone-Acetaminophen]   . Sulfa Antibiotics     Upset stomach    Current Outpatient Medications on File Prior to Visit  Medication Sig Dispense Refill  . aspirin EC 81 MG EC tablet Take 1 tablet (81 mg total) by mouth daily.    Marland Kitchen azelastine (ASTELIN) 0.1 % nasal spray PLACE 2 SPRAYS INTO BOTH NOSTRILS 2 (TWO) TIMES DAILY. 30 mL 3  . azelastine (OPTIVAR) 0.05 % ophthalmic solution Place 1 drop into both eyes 2 (two) times daily. 6 mL 11  . beclomethasone (BECONASE-AQ) 42 MCG/SPRAY nasal spray Place 2 sprays into both nostrils 2 (two) times daily. Dose is for each nostril. 25 g 12  . cetirizine (ZYRTEC) 10 MG tablet Take 10 mg by mouth daily.    Marland Kitchen eletriptan (RELPAX) 20 MG tablet Take 1 tablet (20 mg total) by mouth as needed for migraine or headache. May repeat in 2 hours if headache persists or recurs. 10 tablet 0  . esomeprazole (NEXIUM) 40 MG capsule Take 1 capsule (40 mg total) by mouth daily. 30 capsule 11  . levonorgestrel (MIRENA) 20 MCG/24HR IUD 1 each by Intrauterine route once.    Marland Kitchen azithromycin (ZITHROMAX) 250 MG tablet Take 2 tablets by mouth on day 1, followed by 1 tablet by mouth daily for 4 days. (Patient not taking: Reported on 09/17/2020) 6 tablet 0  . fluticasone (FLONASE) 50 MCG/ACT nasal spray Place 2 sprays into both nostrils daily. (Patient not taking: Reported on 08/05/2020) 16 g 1  . metFORMIN (GLUCOPHAGE) 500 MG tablet Take 1 tablet (500 mg total) by mouth daily with breakfast. (Patient not taking: Reported on 02/23/2020) 30 tablet 3  . methylPREDNISolone (MEDROL) 4 MG tablet 4 tab po day 1, 3 tab po day 2, 3 tab po day 3, 1 tab po day 4 (Patient not taking: Reported on 09/17/2020) 10 tablet 0  . mometasone (NASONEX) 50 MCG/ACT  nasal spray 2 sprays twice daily (Patient not taking: Reported on 09/17/2020) 1 each 11   No current facility-administered medications on file prior to visit.    BP 116/76   Pulse 85   Temp 98.6 F (37 C) (Oral)   Resp 18   Ht 5' 3"  (1.6 m)   Wt 175 lb 12.8 oz (79.7 kg)   SpO2 100%   BMI 31.14 kg/m       Objective:   Physical Exam  General- No acute distress. Pleasant patient.  Neck- Full range of motion, no jvd Lungs- Clear, even and unlabored. Heart- regular rate and rhythm. Neurologic- CNII- XII grossly intact. Rt elbow- no pain on palpation but on palpation of lateral epicondyle you report lower forearm/ulna area feels better. Rt forearm- mild tender to palpation distal aspect over ulna area. Mild ulnar styloid area pain.     Assessment & Plan:  For rt forearm and wrist area pain continue ibuprofen and rest. Decided to go ahead and refer you to sports med MD. In your case they may do ultrasound. Not sure if xray would be needed since no acute injury. Pain likely muscle and tendon pain.  For gerd refilled nexium.  For allergies refilled astelin and nasonex.  Follow up as regularly scheduled or as needed.

## 2020-09-17 NOTE — Telephone Encounter (Signed)
error 

## 2020-09-17 NOTE — Patient Instructions (Addendum)
For rt forearm and wrist area pain continue ibuprofen and rest. Decided to go ahead and refer you to sports med MD. In your case they may do ultrasound. Not sure if xray would be needed since no acute injury. Pain likely muscle and tendon pain.  For gerd refilled nexium.  For allergies refilled astelin and nasonex.  Follow up as regularly scheduled or as needed.

## 2020-12-09 ENCOUNTER — Other Ambulatory Visit (HOSPITAL_BASED_OUTPATIENT_CLINIC_OR_DEPARTMENT_OTHER): Payer: Self-pay | Admitting: Medical

## 2020-12-09 ENCOUNTER — Ambulatory Visit (INDEPENDENT_AMBULATORY_CARE_PROVIDER_SITE_OTHER): Payer: PRIVATE HEALTH INSURANCE | Admitting: Medical

## 2020-12-09 ENCOUNTER — Other Ambulatory Visit: Payer: Self-pay

## 2020-12-09 VITALS — BP 118/66 | HR 111 | Resp 18 | Ht 63.0 in | Wt 173.4 lb

## 2020-12-09 DIAGNOSIS — R0683 Snoring: Secondary | ICD-10-CM | POA: Diagnosis not present

## 2020-12-09 DIAGNOSIS — J3489 Other specified disorders of nose and nasal sinuses: Secondary | ICD-10-CM

## 2020-12-09 MED ORDER — AZITHROMYCIN 250 MG PO TABS: ORAL_TABLET | ORAL | 0 refills | Status: DC

## 2020-12-09 MED FILL — AZITHROMYCIN 250 MG TABLET: 250 | 5 days supply | Qty: 6 | Fill #0

## 2020-12-09 NOTE — Progress Notes (Signed)
   Subjective:    Patient ID: Lindsey Macias, female    DOB: 01-20-1976, 44 y.o.   MRN: [id number]  HPI  Virtual Visit via Video Note  I connected with Lindsey Macias on 12/09/20 at  4:20 PM EST by a video enabled telemedicine application and verified that I am speaking with the correct person using two identifiers.  Location: Patient: home Provider: office   I discussed the limitations of evaluation and management by telemedicine and the availability of in person appointments. The patient expressed understanding and agreed to proceed.  History of Present Illness:   Pt has some sinus pressure and some st. Pt does have some sinus pressure. Pt just got called from he son who got covid last week. Pt had recent exposure on Thursday. Pt is does have hx of sinus infections and allergies.  Pt has concerns for sleep apnea. She states recently she wakes herself snoring, not sleeping well and not having restful sleep.   Observations/Objective:  General-no acute distress, pleasant, oriented. Lungs- on inspection lungs appear unlabored. Neck- no tracheal deviation or jvd on inspection. Neuro- gross motor function appears intact. Frontal and maxillary sinus pressure on self palpation.  Assessment and Plan: History of allergic rhinitis with sinus infection. Possible recurrent early sinus infection. Rx azithromycin and continue flonase and astelin nasal sprays.  Exposure to covid. Stay at home and quarantine pending pcr test results. If + then can follow  new 5 day quarantine guidelines.  Follow 7-10 days or as needed  Mackie Pai, PA-C  Time spent with patient today was  20 minutes which consisted of chart review, discussing diagnosis, work up, treatment and documentation.  Follow Up Instructions:    I discussed the assessment and treatment plan with the patient. The patient was provided an opportunity to ask questions and all were answered. The patient agreed with the plan and  demonstrated an understanding of the instructions.   The patient was advised to call back or seek an in-person evaluation if the symptoms worsen or if the condition fails to improve as anticipated.  Mackie Pai, PA-C   Review of Systems     Objective:   Physical Exam        Assessment & Plan:

## 2020-12-10 ENCOUNTER — Encounter: Payer: Self-pay | Admitting: Medical

## 2020-12-10 NOTE — Patient Instructions (Signed)
History of allergic rhinitis with sinus infection. Possible recurrent early sinus infection. Rx azithromycin and continue flonase and astelin nasal sprays.  Exposure to covid. Stay at home and quarantine pending pcr test results. If + then can follow  new 5 day quarantine guidelines.  Follow 7-10 days or as needed

## 2020-12-22 ENCOUNTER — Encounter: Payer: Self-pay | Admitting: Medical

## 2021-02-19 ENCOUNTER — Other Ambulatory Visit: Payer: Self-pay | Admitting: *Deleted

## 2021-02-19 DIAGNOSIS — K551 Chronic vascular disorders of intestine: Secondary | ICD-10-CM

## 2021-02-24 ENCOUNTER — Encounter (HOSPITAL_COMMUNITY): Payer: PRIVATE HEALTH INSURANCE

## 2021-02-24 ENCOUNTER — Ambulatory Visit: Payer: PRIVATE HEALTH INSURANCE

## 2021-02-26 ENCOUNTER — Other Ambulatory Visit: Payer: Self-pay | Admitting: Medical

## 2021-04-25 ENCOUNTER — Ambulatory Visit (HOSPITAL_COMMUNITY): Payer: PRIVATE HEALTH INSURANCE

## 2021-04-25 ENCOUNTER — Ambulatory Visit: Payer: PRIVATE HEALTH INSURANCE

## 2021-04-29 ENCOUNTER — Other Ambulatory Visit: Payer: Self-pay

## 2021-04-29 ENCOUNTER — Encounter: Payer: Self-pay | Admitting: Family Medicine

## 2021-04-29 ENCOUNTER — Telehealth (INDEPENDENT_AMBULATORY_CARE_PROVIDER_SITE_OTHER): Payer: Self-pay | Admitting: Family Medicine

## 2021-04-29 VITALS — Ht 63.0 in | Wt 177.0 lb

## 2021-04-29 DIAGNOSIS — J029 Acute pharyngitis, unspecified: Secondary | ICD-10-CM

## 2021-04-29 DIAGNOSIS — H938X1 Other specified disorders of right ear: Secondary | ICD-10-CM

## 2021-04-29 MED ORDER — PREDNISONE 20 MG PO TABS: 40.0000 mg | ORAL_TABLET | Freq: Every day | ORAL | 0 refills | Status: AC

## 2021-04-29 MED ORDER — AMOXICILLIN 500 MG PO CAPS: 1000.0000 mg | ORAL_CAPSULE | Freq: Every day | ORAL | 0 refills | Status: DC

## 2021-04-29 MED ORDER — FLUCONAZOLE 150 MG PO TABS: ORAL_TABLET | ORAL | 0 refills | Status: DC

## 2021-04-29 NOTE — Progress Notes (Signed)
Chief Complaint  Patient presents with  . Sore Throat    Started on Thursday 5/12, subjective fever, denies vomiting, having nausea d/t drainage, pending covid test (went 5/16), nasal congest, taking ibuprofen 61m tid yesterday, gargling warm salt water, tylenol sinus     JJudithann Saugerhere for URI complaints. Due to COVID-19 pandemic, we are interacting via web portal for an electronic face-to-face visit. I verified patient's ID using 2 identifiers. Patient agreed to proceed with visit via this method. Patient is at home, I am at office. Patient and I are present for visit.   Duration: 5 days Associated symptoms: Fever (99.5 F), sinus congestion, sinus pain, rhinorrhea, ear fullness, sore throat and coughing from drainage Denies: itchy watery eyes, ear pain, wheezing, shortness of breath, myalgia and N/V/D Treatment to date: Ibuprofen, Tylenol sinus Sick contacts: No  Had a covid test yesterday, awaiting results.   Past Medical History:  Diagnosis Date  . Allergy   . Cancer ([facility] 2003   cervical cancer 2002 and 2003. had leep procedure.  .Marland KitchenGERD (gastroesophageal reflux disease)    but actually had h pylori and took meds then got better.  . Varicose veins of both lower extremities     Ht 5' 3"  (1.6 m)   Wt 177 lb (80.3 kg)   BMI 31.35 kg/m  No conversational dyspnea Age appropriate judgment and insight Nml affect and mood  Sore throat - Plan: predniSONE (DELTASONE) 20 MG tablet, amoxicillin (AMOXIL) 500 MG capsule, fluconazole (DIFLUCAN) 150 MG tablet  Sensation of fullness in right ear - Plan: predniSONE (DELTASONE) 20 MG tablet  5 d pred burst, 40 mg/d. If no better in 2 d, will start strep tx. She should have her covid results by then as well.  Continue to push fluids, practice good hand hygiene, cover mouth when coughing. F/u prn. If starting to experience fevers, shaking, or shortness of breath, seek immediate care. Pt voiced understanding and agreement to the  plan.  NOstrander DO 04/29/21 10:41 AM

## 2021-04-30 ENCOUNTER — Other Ambulatory Visit: Payer: Self-pay | Admitting: Family Medicine

## 2021-07-04 ENCOUNTER — Other Ambulatory Visit: Payer: Self-pay | Admitting: *Deleted

## 2021-07-04 DIAGNOSIS — K551 Chronic vascular disorders of intestine: Secondary | ICD-10-CM

## 2021-07-11 ENCOUNTER — Ambulatory Visit (HOSPITAL_COMMUNITY): Payer: Managed Care, Other (non HMO)

## 2021-07-11 ENCOUNTER — Ambulatory Visit: Payer: Self-pay

## 2021-08-22 ENCOUNTER — Encounter (HOSPITAL_COMMUNITY): Payer: Self-pay

## 2021-08-22 ENCOUNTER — Ambulatory Visit: Payer: Self-pay

## 2021-09-17 ENCOUNTER — Other Ambulatory Visit: Payer: Self-pay

## 2021-09-17 ENCOUNTER — Encounter: Payer: Self-pay | Admitting: *Deleted

## 2021-09-17 ENCOUNTER — Ambulatory Visit (INDEPENDENT_AMBULATORY_CARE_PROVIDER_SITE_OTHER): Payer: Managed Care, Other (non HMO) | Admitting: Medical

## 2021-09-17 VITALS — BP 111/80 | HR 87 | Temp 98.3°F | Ht 63.0 in | Wt 177.4 lb

## 2021-09-17 DIAGNOSIS — J0100 Acute maxillary sinusitis, unspecified: Secondary | ICD-10-CM

## 2021-09-17 DIAGNOSIS — Z23 Encounter for immunization: Secondary | ICD-10-CM | POA: Diagnosis not present

## 2021-09-17 DIAGNOSIS — H6690 Otitis media, unspecified, unspecified ear: Secondary | ICD-10-CM | POA: Diagnosis not present

## 2021-09-17 MED ORDER — AMOXICILLIN-POT CLAVULANATE 875-125 MG PO TABS: 1.0000 | ORAL_TABLET | Freq: Two times a day (BID) | ORAL | 0 refills | Status: DC

## 2021-09-17 MED ORDER — CEFTRIAXONE SODIUM 1 G IJ SOLR
1.0000 g | Freq: Once | INTRAMUSCULAR | Status: AC
  Administered 2021-09-17: 1 g via INTRAMUSCULAR

## 2021-09-17 NOTE — Patient Instructions (Addendum)
Sinus infection and bilateral OM with hx of allergic rhinitis.  Rx rocephin 1 gram im. Rx augmentin antibiotic. Continue allergy meds.  Follow up in 10-14 days or sooner if needed. On follow up can do wellness exam as well.

## 2021-09-17 NOTE — Addendum Note (Signed)
Addended by: Kem Boroughs D on: 09/17/2021 12:49 PM   Modules accepted: Orders

## 2021-09-17 NOTE — Progress Notes (Signed)
Subjective:    Patient ID: Lindsey Macias, female    DOB: January 28, 1976, 45 y.o.   MRN: [id number]  HPI   3 weeks of increasing sinus pressure and nasal congestion. No fever, no chills or sweats. No bodyaches.   Hx of allergic rhinitis in past year round. Occasional sinus infection as well.  Pt does have history of allergies in past. Has not seen her allergist recently.   Pt takes nasonex and astelin. Also on zyrtec.  Review of Systems  Constitutional:  Negative for chills, fatigue and fever.  HENT:  Positive for congestion, sinus pressure and sinus pain.   Respiratory:  Negative for cough, chest tightness, shortness of breath and wheezing.   Cardiovascular:  Negative for chest pain and palpitations.  Gastrointestinal:  Negative for abdominal pain.  Genitourinary:  Negative for dysuria and flank pain.  Musculoskeletal:  Negative for back pain, joint swelling, neck pain and neck stiffness.  Skin:  Negative for rash.     Past Medical History:  Diagnosis Date   Allergy    Cancer ([place]) 2003   cervical cancer 2002 and 2003. had leep procedure.   GERD (gastroesophageal reflux disease)    but actually had h pylori and took meds then got better.   Varicose veins of both lower extremities      Social History   Socioeconomic History   Marital status: Divorced    Spouse name: Not on file   Number of children: Not on file   Years of education: Not on file   Highest education level: Not on file  Occupational History   Not on file  Tobacco Use   Smoking status: Former    Packs/day: 0.00    Types: Cigarettes    Start date: 11/23/1994    Quit date: 07/28/2017    Years since quitting: 4.1   Smokeless tobacco: Never  Vaping Use   Vaping Use: Never used  Substance and Sexual Activity   Alcohol use: Yes    Comment: Pt states she drinks 2-3 drinks per year.   Drug use: No   Sexual activity: Not Currently    Comment: intercourse age 67, more than 5 sexual partners  Other Topics  Concern   Not on file  Social History Narrative   Not on file   Social Determinants of Health   Financial Resource Strain: Not on file  Food Insecurity: Not on file  Transportation Needs: Not on file  Physical Activity: Not on file  Stress: Not on file  Social Connections: Not on file  Intimate Partner Violence: Not on file    Past Surgical History:  Procedure Laterality Date   Bunions surgery Right 2010 and 2012   ENDOVENOUS ABLATION SAPHENOUS VEIN W/ LASER Right 08/02/2019   endovenous laser ablation right greater saphenous vein by Ruta Hinds MD    SHOULDER SURGERY Right 2012   TONSILLECTOMY Bilateral 2012    Family History  Problem Relation Age of Onset   Cancer Father        Brain   Diabetes Maternal Grandmother     Allergies  Allergen Reactions   Vicodin [Hydrocodone-Acetaminophen] Photosensitivity   Dust Mite Extract     Sneezing,Watery eyes,Runny nose   Percocet [Oxycodone-Acetaminophen]    Sulfa Antibiotics     Upset stomach    Current Outpatient Medications on File Prior to Visit  Medication Sig Dispense Refill   aspirin EC 81 MG EC tablet Take 1 tablet (81 mg total) by mouth daily.  azelastine (ASTELIN) 0.1 % nasal spray PLACE TWO SPRAYS INTO BOTH NOSTRILS TWO TIMES DAILY 90 mL 2   azelastine (OPTIVAR) 0.05 % ophthalmic solution Place 1 drop into both eyes 2 (two) times daily. 6 mL 11   eletriptan (RELPAX) 20 MG tablet Take 1 tablet (20 mg total) by mouth as needed for migraine or headache. May repeat in 2 hours if headache persists or recurs. 10 tablet 0   esomeprazole (NEXIUM) 40 MG capsule Take 1 capsule (40 mg total) by mouth daily. 90 capsule 3   fluconazole (DIFLUCAN) 150 MG tablet Take 1 tab, repeat in 48 hours if no improvement. 2 tablet 0   levonorgestrel (MIRENA) 20 MCG/24HR IUD 1 each by Intrauterine route once.     mometasone (NASONEX) 50 MCG/ACT nasal spray 2 sprays twice daily 1 each 11   No current facility-administered medications  on file prior to visit.    BP 111/80   Pulse 87   Temp 98.3 F (36.8 C)   Ht 5' 3"  (1.6 m)   Wt 177 lb 6.4 oz (80.5 kg)   SpO2 96%   BMI 31.42 kg/m       Objective:   Physical Exam  General- No acute distress. Pleasant patient. Neck- Full range of motion, no jvd Lungs- Clear, even and unlabored. Heart- regular rate and rhythm. Neurologic- CNII- XII grossly intact.  Heent- maxillary sinus pressure to palpation. Bilateral pinkish red tm. Left side worse than rt side.      Assessment & Plan:   Patient Instructions  Sinus infection and bilateral OM with hx of allergic rhinitis.  Rx rocephin 1 gram im. Rx augmentin antibiotic. Continue allergy meds.  Follow up in 10-14 days or sooner if needed. On follow up can do wellness exam as well.   Mackie Pai, PA-C

## 2021-09-29 ENCOUNTER — Ambulatory Visit (INDEPENDENT_AMBULATORY_CARE_PROVIDER_SITE_OTHER): Payer: Managed Care, Other (non HMO) | Admitting: Medical

## 2021-09-29 ENCOUNTER — Other Ambulatory Visit: Payer: Self-pay

## 2021-09-29 VITALS — BP 112/75 | HR 81 | Temp 98.2°F | Resp 20 | Ht 63.0 in | Wt 179.4 lb

## 2021-09-29 DIAGNOSIS — J029 Acute pharyngitis, unspecified: Secondary | ICD-10-CM

## 2021-09-29 DIAGNOSIS — H6690 Otitis media, unspecified, unspecified ear: Secondary | ICD-10-CM

## 2021-09-29 DIAGNOSIS — J0100 Acute maxillary sinusitis, unspecified: Secondary | ICD-10-CM | POA: Diagnosis not present

## 2021-09-29 MED ORDER — CEFTRIAXONE SODIUM 1 G IJ SOLR
1.0000 g | Freq: Once | INTRAMUSCULAR | Status: AC
  Administered 2021-09-29: 1 g via INTRAMUSCULAR

## 2021-09-29 MED ORDER — FLUCONAZOLE 150 MG PO TABS: ORAL_TABLET | ORAL | 0 refills | Status: DC

## 2021-09-29 MED ORDER — CLINDAMYCIN HCL 300 MG PO CAPS: 300.0000 mg | ORAL_CAPSULE | Freq: Three times a day (TID) | ORAL | 0 refills | Status: DC

## 2021-09-29 MED ORDER — METHYLPREDNISOLONE 4 MG PO TABS: ORAL_TABLET | ORAL | 0 refills | Status: DC

## 2021-09-29 NOTE — Progress Notes (Signed)
Subjective:    Patient ID: Lindsey Macias, female    DOB: 1976/10/11, 45 y.o.   MRN: [id number]  HPI  Pt has about 4 weeks or more of sinus infection and nasal congestion. See last note. Gave rocephin 1 gram im and antibiotic.   Pt states feels little better but some upper teeth still painful.   Pt states when bends over has increased sinus pain.   Still some ear pressure.   Hx of allergies and on nasonex, atelin and zyrtec.  Pt states frontal sinus feel some better. Maxillary sinus are still tender.    Review of Systems  Constitutional:  Negative for chills, fatigue and fever.  HENT:  Positive for congestion, sinus pressure and sneezing. Negative for sore throat.   Respiratory:  Negative for cough, chest tightness, shortness of breath and wheezing.   Cardiovascular:  Negative for chest pain and palpitations.  Gastrointestinal:  Negative for abdominal pain.  Genitourinary:  Negative for difficulty urinating, dysuria and enuresis.  Musculoskeletal:  Negative for back pain.    Past Medical History:  Diagnosis Date   Allergy    Cancer ([place]) 2003   cervical cancer 2002 and 2003. had leep procedure.   GERD (gastroesophageal reflux disease)    but actually had h pylori and took meds then got better.   Varicose veins of both lower extremities      Social History   Socioeconomic History   Marital status: Divorced    Spouse name: Not on file   Number of children: Not on file   Years of education: Not on file   Highest education level: Not on file  Occupational History   Not on file  Tobacco Use   Smoking status: Former    Packs/day: 0.00    Types: Cigarettes    Start date: 11/23/1994    Quit date: 07/28/2017    Years since quitting: 4.1   Smokeless tobacco: Never  Vaping Use   Vaping Use: Never used  Substance and Sexual Activity   Alcohol use: Yes    Comment: Pt states she drinks 2-3 drinks per year.   Drug use: No   Sexual activity: Not Currently    Comment:  intercourse age 45, more than 5 sexual partners  Other Topics Concern   Not on file  Social History Narrative   Not on file   Social Determinants of Health   Financial Resource Strain: Not on file  Food Insecurity: Not on file  Transportation Needs: Not on file  Physical Activity: Not on file  Stress: Not on file  Social Connections: Not on file  Intimate Partner Violence: Not on file    Past Surgical History:  Procedure Laterality Date   Bunions surgery Right 2010 and 2012   ENDOVENOUS ABLATION SAPHENOUS VEIN W/ LASER Right 08/02/2019   endovenous laser ablation right greater saphenous vein by Ruta Hinds MD    SHOULDER SURGERY Right 2012   TONSILLECTOMY Bilateral 2012    Family History  Problem Relation Age of Onset   Cancer Father        Brain   Diabetes Maternal Grandmother     Allergies  Allergen Reactions   Vicodin [Hydrocodone-Acetaminophen] Photosensitivity   Dust Mite Extract     Sneezing,Watery eyes,Runny nose   Percocet [Oxycodone-Acetaminophen]    Sulfa Antibiotics     Upset stomach    Current Outpatient Medications on File Prior to Visit  Medication Sig Dispense Refill   aspirin EC 81 MG EC  tablet Take 1 tablet (81 mg total) by mouth daily.     azelastine (ASTELIN) 0.1 % nasal spray PLACE TWO SPRAYS INTO BOTH NOSTRILS TWO TIMES DAILY 90 mL 2   azelastine (OPTIVAR) 0.05 % ophthalmic solution Place 1 drop into both eyes 2 (two) times daily. 6 mL 11   eletriptan (RELPAX) 20 MG tablet Take 1 tablet (20 mg total) by mouth as needed for migraine or headache. May repeat in 2 hours if headache persists or recurs. 10 tablet 0   esomeprazole (NEXIUM) 40 MG capsule Take 1 capsule (40 mg total) by mouth daily. 90 capsule 3   fluconazole (DIFLUCAN) 150 MG tablet Take 1 tab, repeat in 48 hours if no improvement. 2 tablet 0   levonorgestrel (MIRENA) 20 MCG/24HR IUD 1 each by Intrauterine route once.     mometasone (NASONEX) 50 MCG/ACT nasal spray 2 sprays twice  daily 1 each 11   No current facility-administered medications on file prior to visit.    BP 112/75   Pulse 81   Temp 98.2 F (36.8 C)   Resp 20   Ht 5' 3"  (1.6 m)   Wt 179 lb 6.4 oz (81.4 kg)   SpO2 96%   BMI 31.78 kg/m       Objective:   Physical Exam  General  Mental Status - Alert. General Appearance - Well groomed. Not in acute distress.  Skin Rashes- No Rashes.  HEENT Head- Normal. Ear Auditory Canal - Left- Normal. Right - Normal.Tympanic Membrane- Left- moderate redness to left tm. Right- Normal. Eye Sclera/Conjunctiva- Left- Normal. Right- Normal. Nose & Sinuses Nasal Mucosa- Left-  Boggy and Congested. Right-  Boggy and  Congested.Bilateral maxillary and frontal sinus pressure. Mouth & Throat Lips: Upper Lip- Normal: no dryness, cracking, pallor, cyanosis, or vesicular eruption. Lower Lip-Normal: no dryness, cracking, pallor, cyanosis or vesicular eruption. Buccal Mucosa- Bilateral- No Aphthous ulcers. Oropharynx- No Discharge or Erythema. Tonsils: Characteristics- Bilateral- No Erythema or Congestion. Size/Enlargement- Bilateral- No enlargement. Discharge- bilateral-None.  Neck Neck- Supple. No Masses.   Chest and Lung Exam Auscultation: Breath Sounds:-Clear even and unlabored.  Cardiovascular Auscultation:Rythm- Regular, rate and rhythm. Murmurs & Other Heart Sounds:Ausculatation of the heart reveal- No Murmurs.  Lymphatic Head & Neck General Head & Neck Lymphatics: Bilateral: Description- No Localized lymphadenopathy.       Assessment & Plan:   Patient Instructions  Persisting sinus and left ear infection despite recent antibiotics. Will give rocpehin 1 gram im in office today. Rx clindamycin. Rx advisement on using probiotic tabs and eating probiotic rich foods.  For hx of allergies continue current allergy meds and ading 6 day medrol dose course.  Rx diflucan tabs to use if needed for yeast infection.  Follow up 1 week or sooner if  needed.    Mackie Pai, PA-C

## 2021-09-29 NOTE — Addendum Note (Signed)
Addended by: Jeronimo Greaves on: 09/29/2021 01:44 PM   Modules accepted: Orders

## 2021-09-29 NOTE — Patient Instructions (Addendum)
Persisting sinus and left ear infection despite recent antibiotics. Will give rocpehin 1 gram im in office today. Rx clindamycin. Rx advisement on using probiotic tabs and eating probiotic rich foods.  For hx of allergies continue current allergy meds and adding 6 day medrol dose course.  Rx diflucan tabs to use if needed for yeast infection.  If on follow up your infection persist consider ct imaging and/or referral to ENT.  Follow up 1 week or sooner if needed.

## 2021-10-06 ENCOUNTER — Ambulatory Visit (INDEPENDENT_AMBULATORY_CARE_PROVIDER_SITE_OTHER): Payer: Managed Care, Other (non HMO) | Admitting: Medical

## 2021-10-06 ENCOUNTER — Other Ambulatory Visit: Payer: Self-pay

## 2021-10-06 VITALS — BP 113/74 | HR 80 | Temp 97.9°F | Resp 18 | Ht 63.0 in | Wt 180.6 lb

## 2021-10-06 DIAGNOSIS — H938X3 Other specified disorders of ear, bilateral: Secondary | ICD-10-CM | POA: Diagnosis not present

## 2021-10-06 DIAGNOSIS — J320 Chronic maxillary sinusitis: Secondary | ICD-10-CM

## 2021-10-06 DIAGNOSIS — J309 Allergic rhinitis, unspecified: Secondary | ICD-10-CM | POA: Diagnosis not present

## 2021-10-06 DIAGNOSIS — J029 Acute pharyngitis, unspecified: Secondary | ICD-10-CM

## 2021-10-06 DIAGNOSIS — R0981 Nasal congestion: Secondary | ICD-10-CM

## 2021-10-06 DIAGNOSIS — H6690 Otitis media, unspecified, unspecified ear: Secondary | ICD-10-CM

## 2021-10-06 MED ORDER — METHYLPREDNISOLONE ACETATE 40 MG/ML IJ SUSP
40.0000 mg | Freq: Once | INTRAMUSCULAR | Status: AC
  Administered 2021-10-06: 40 mg via INTRAMUSCULAR

## 2021-10-06 MED ORDER — LEVOFLOXACIN 500 MG PO TABS: ORAL_TABLET | ORAL | 0 refills | Status: DC

## 2021-10-06 MED ORDER — FLUCONAZOLE 150 MG PO TABS: ORAL_TABLET | ORAL | 0 refills | Status: DC

## 2021-10-06 NOTE — Patient Instructions (Addendum)
Persisting sinus infection despite augmentin, clindamycin and 2 rocephin injections.  Allergic rhinitis component as well.  Finishing clindamycin today.   Start levaquin tomorrow morning and we gave you depo medrol 40 mg im today.  Need to go ahead and refer you to ENT as you have persisting/resistant sinus infection.  Follow up date 10-14 days or sooner if needed.

## 2021-10-06 NOTE — Addendum Note (Signed)
Addended by: Jeronimo Greaves on: 10/06/2021 09:27 AM   Modules accepted: Orders

## 2021-10-06 NOTE — Progress Notes (Signed)
Subjective:    Patient ID: Lindsey Macias, female    DOB: May 27, 1976, 45 y.o.   MRN: [id number]  HPI  Pt in for follow up/ seen past one week ago for sinus infection with some allergie signs/symptoms.  She was give clindamyicin and 6 day medrol. Pt was already on zyrtec and nasal sprays(astelin and nasonex).   States her sinus pressure has decreased some but still present. States getting some crackling and popping to her ears.   Estimates about 50% better.  Overall sinus infection since 09/17/2021. Has already received 2 rocephin injections.    Review of Systems  Constitutional:  Negative for chills, fatigue and fever.  HENT:  Negative for dental problem.   Respiratory:  Negative for chest tightness, shortness of breath and wheezing.   Cardiovascular:  Negative for chest pain and palpitations.  Gastrointestinal:  Negative for abdominal pain, blood in stool, constipation and nausea.  Musculoskeletal:  Negative for back pain, joint swelling and neck pain.  Skin:  Negative for rash.  Hematological:  Negative for adenopathy. Does not bruise/bleed easily.  Psychiatric/Behavioral:  Negative for agitation, decreased concentration, dysphoric mood and hallucinations.      Past Medical History:  Diagnosis Date   Allergy    Cancer ([place]) 2003   cervical cancer 2002 and 2003. had leep procedure.   GERD (gastroesophageal reflux disease)    but actually had h pylori and took meds then got better.   Varicose veins of both lower extremities      Social History   Socioeconomic History   Marital status: Divorced    Spouse name: Not on file   Number of children: Not on file   Years of education: Not on file   Highest education level: Not on file  Occupational History   Not on file  Tobacco Use   Smoking status: Former    Packs/day: 0.00    Types: Cigarettes    Start date: 11/23/1994    Quit date: 07/28/2017    Years since quitting: 4.1   Smokeless tobacco: Never  Vaping Use    Vaping Use: Never used  Substance and Sexual Activity   Alcohol use: Yes    Comment: Pt states she drinks 2-3 drinks per year.   Drug use: No   Sexual activity: Not Currently    Comment: intercourse age 48, more than 5 sexual partners  Other Topics Concern   Not on file  Social History Narrative   Not on file   Social Determinants of Health   Financial Resource Strain: Not on file  Food Insecurity: Not on file  Transportation Needs: Not on file  Physical Activity: Not on file  Stress: Not on file  Social Connections: Not on file  Intimate Partner Violence: Not on file    Past Surgical History:  Procedure Laterality Date   Bunions surgery Right 2010 and 2012   ENDOVENOUS ABLATION SAPHENOUS VEIN W/ LASER Right 08/02/2019   endovenous laser ablation right greater saphenous vein by Ruta Hinds MD    SHOULDER SURGERY Right 2012   TONSILLECTOMY Bilateral 2012    Family History  Problem Relation Age of Onset   Cancer Father        Brain   Diabetes Maternal Grandmother     Allergies  Allergen Reactions   Vicodin [Hydrocodone-Acetaminophen] Photosensitivity   Dust Mite Extract     Sneezing,Watery eyes,Runny nose   Percocet [Oxycodone-Acetaminophen]    Sulfa Antibiotics     Upset stomach  Current Outpatient Medications on File Prior to Visit  Medication Sig Dispense Refill   aspirin EC 81 MG EC tablet Take 1 tablet (81 mg total) by mouth daily.     azelastine (ASTELIN) 0.1 % nasal spray PLACE TWO SPRAYS INTO BOTH NOSTRILS TWO TIMES DAILY 90 mL 2   azelastine (OPTIVAR) 0.05 % ophthalmic solution Place 1 drop into both eyes 2 (two) times daily. 6 mL 11   clindamycin (CLEOCIN) 300 MG capsule Take 1 capsule (300 mg total) by mouth 3 (three) times daily. 21 capsule 0   eletriptan (RELPAX) 20 MG tablet Take 1 tablet (20 mg total) by mouth as needed for migraine or headache. May repeat in 2 hours if headache persists or recurs. 10 tablet 0   esomeprazole (NEXIUM) 40 MG  capsule Take 1 capsule (40 mg total) by mouth daily. 90 capsule 3   fluconazole (DIFLUCAN) 150 MG tablet Take 1 tab, repeat in 48 hours if no improvement. 2 tablet 0   levonorgestrel (MIRENA) 20 MCG/24HR IUD 1 each by Intrauterine route once.     methylPREDNISolone (MEDROL) 4 MG tablet Standard 6 day taper 21 tablet 0   mometasone (NASONEX) 50 MCG/ACT nasal spray 2 sprays twice daily 1 each 11   No current facility-administered medications on file prior to visit.    BP 113/74   Pulse 80   Temp 97.9 F (36.6 C)   Resp 18   Ht 5' 3"  (1.6 m)   Wt 180 lb 9.6 oz (81.9 kg)   SpO2 96%   BMI 31.99 kg/m         Objective:   Physical Exam  General   Mental Status - Alert. General Appearance - Well groomed. Not in acute distress.   Skin Rashes- No Rashes.   HEENT Head- Normal. Ear Auditory Canal - Left- Normal. Right - Normal.Tympanic Membrane- Left- mild pnkish redness to left tm. Right- Normal. Eye Sclera/Conjunctiva- Left- Normal. Right- Normal. Nose & Sinuses Nasal Mucosa- Left-  Boggy and Congested. Right-  Boggy and  Congested.Bilateral maxillary and frontal sinus pressure. Mouth & Throat Lips: Upper Lip- Normal: no dryness, cracking, pallor, cyanosis, or vesicular eruption. Lower Lip-Normal: no dryness, cracking, pallor, cyanosis or vesicular eruption. Buccal Mucosa- Bilateral- No Aphthous ulcers. Oropharynx- No Discharge or Erythema. Tonsils: Characteristics- Bilateral- No Erythema or Congestion. Size/Enlargement- Bilateral- No enlargement. Discharge- bilateral-None.   Neck Neck- Supple. No Masses.    Chest and Lung Exam Auscultation: Breath Sounds:-Clear even and unlabored.   Cardiovascular Auscultation:Rythm- Regular, rate and rhythm. Murmurs & Other Heart Sounds:Ausculatation of the heart reveal- No Murmurs.   Lymphatic Head & Neck General Head & Neck Lymphatics: Bilateral: Description- No Localized lymphadenopathy.       Assessment & Plan:   Patient  Instructions  Persisting sinus infection despite augmentin, clindamycin and 2 rocephin injections.  Allergic rhinitis component as well.  Finishing clindamycin today.   Start levaquin tomorrow morning and we gave you depo medrol 40 mg im today.  Need to go ahead and refer you to ENT as you have persisting/resistant sinus infection.  Follow up date 10-14 days or sooner if needed.   Mackie Pai, PA-C

## 2021-10-14 ENCOUNTER — Other Ambulatory Visit: Payer: Self-pay | Admitting: Medical

## 2021-10-29 ENCOUNTER — Other Ambulatory Visit: Payer: Self-pay | Admitting: Medical

## 2021-11-18 ENCOUNTER — Encounter: Payer: Self-pay | Admitting: Medical

## 2021-11-18 ENCOUNTER — Telehealth (INDEPENDENT_AMBULATORY_CARE_PROVIDER_SITE_OTHER): Payer: Managed Care, Other (non HMO) | Admitting: Medical

## 2021-11-18 DIAGNOSIS — F439 Reaction to severe stress, unspecified: Secondary | ICD-10-CM

## 2021-11-18 NOTE — Progress Notes (Addendum)
   Subjective:    Patient ID: Lindsey Macias, female    DOB: 03/12/1976, 45 y.o.   MRN: [id number]  HPI  Virtual Visit via Video Note  I connected with Lindsey Macias on 11/18/21 at  8:40 AM EST by a video enabled telemedicine application and verified that I am speaking with the correct person using two identifiers.  Location: Patient: home Provider: home.   I discussed the limitations of evaluation and management by telemedicine and the availability of in person appointments. The patient expressed understanding and agreed to proceed.  History of Present Illness: Pt states she has appointment with neurologist on December 17, 2020.   Chronic sinus infections hx but did not respond to antibiotics. Pt went to ent who did not find infection. ENT did not see sinus infection.  Pt thinks frontal sinus pressure may be 15-18 hours a day 7 days a week. Works for [employer] hours a day. She thinks this is source of symptoms. Feeling constant high level stress. Can't relax. Even on vacation works.     Observations/Objective:  General-no acute distress, pleasant, oriented. Lungs- on inspection lungs appear unlabored. Neck- no tracheal deviation or jvd on inspection. Neuro- gross motor function appears intact.   Assessment and Plan:  Patient Instructions  Chronic sinus infection history and treated with rounds of antibiotics.  However frontal sinus pressure persist and ENT did not.  Infection by CT scan.  ENT did place referral to neurologist and glad to hear that you have appointment on January 4.  I reviewed that note when they send me a copy.  On review you do describe daily moderate to severe stress and never having ability to relax as he works 15 hours a day and sometimes even on vacation work up to 12 hours.  If you do have headaches stressed very well could be component.  You describe that you are going to see a counselor.  I am asking medical assistant staff to send you a copy of behavioral  health list through mail or [place].  When you call and get scheduled please notify me.  With name and number I can place referral and hopefully expedite appointment.  If stressed worsening during the interim please let me know.  Follow-up in 3 to 4 months or sooner if needed.   Mackie Pai, PA-C    Follow Up Instructions:    I discussed the assessment and treatment plan with the patient. The patient was provided an opportunity to ask questions and all were answered. The patient agreed with the plan and demonstrated an understanding of the instructions.   The patient was advised to call back or seek an in-person evaluation if the symptoms worsen or if the condition fails to improve as anticipated.  Time spent with patient today was 23  minutes which consisted of chart revdiew, discussing diagnosis, work up treatment and documentation.    Mackie Pai, PA-C   Review of Systems  Constitutional:  Negative for chills, fatigue and fever.  Respiratory:  Negative for cough, chest tightness, shortness of breath and wheezing.   Cardiovascular:  Negative for chest pain and palpitations.  Gastrointestinal:  Negative for abdominal pain, anal bleeding, diarrhea, nausea and vomiting.  Genitourinary:  Negative for dyspareunia, dysuria, flank pain, frequency and hematuria.  Musculoskeletal:  Negative for back pain.  Skin:  Negative for rash.      Objective:   Physical Exam        Assessment & Plan:

## 2021-11-18 NOTE — Patient Instructions (Signed)
Chronic sinus infection history and treated with rounds of antibiotics.  However frontal sinus pressure persist and ENT did not.  Infection by CT scan.  ENT did place referral to neurologist and glad to hear that you have appointment on January 4.  I reviewed that note when they send me a copy.  On review you do describe daily moderate to severe stress and never having ability to relax as he works 15 hours a day and sometimes even on vacation work up to 12 hours.  If you do have headaches stressed very well could be component.  You describe that you are going to see a counselor.  I am asking medical assistant staff to send you a copy of behavioral health list through mail or [place].  When you call and get scheduled please notify me.  With name and number I can place referral and hopefully expedite appointment.  If stressed worsening during the interim please let me know.  Follow-up in 3 to 4 months or sooner if needed.

## 2021-12-09 ENCOUNTER — Ambulatory Visit (INDEPENDENT_AMBULATORY_CARE_PROVIDER_SITE_OTHER): Payer: Managed Care, Other (non HMO) | Admitting: Medical

## 2021-12-09 VITALS — BP 122/80 | HR 98 | Resp 18 | Ht 63.0 in | Wt 186.0 lb

## 2021-12-09 DIAGNOSIS — R944 Abnormal results of kidney function studies: Secondary | ICD-10-CM | POA: Diagnosis not present

## 2021-12-09 DIAGNOSIS — H04302 Unspecified dacryocystitis of left lacrimal passage: Secondary | ICD-10-CM | POA: Diagnosis not present

## 2021-12-09 MED ORDER — BUSPIRONE HCL 7.5 MG PO TABS: 7.5000 mg | ORAL_TABLET | Freq: Two times a day (BID) | ORAL | 0 refills | Status: DC

## 2021-12-09 MED ORDER — TOBRAMYCIN 0.3 % OP SOLN: 2.0000 [drp] | Freq: Four times a day (QID) | OPHTHALMIC | 0 refills | Status: DC

## 2021-12-09 NOTE — Patient Instructions (Addendum)
You appear to have dacrocystitis vs stye. I am favoring dacrycystitis. Will Rx Tobrex eye solution and advise warm compresses to upper lid/massages daily. If you symptoms worsen may need to give oral antibiotic and refer to eye MD.  For weight loss will prescribe metformin 500 mg twice a day but start out once a day for first 2 weeks then advance to twice a day.  Will get cmp future lab to check kidney function before you start metformin.  For anxiety recommend exercise, counseling and will rx low dose buspar.  Follow up in 7 days or sooner if needed.   Dacryocystitis Dacryocystitis is an infection of the sac that collects tears (lacrimal sac). The lacrimal sac is located between the inner corner of the eye and the nose. There are glands around the eyes (lacrimal glands) that make tears that keep the surface of the eye wet and protected. Tears drain from the eye through two small tubes (ducts) in the eyelids near the nose. These ducts carry tears to the lacrimal sac. Another tube (nasolacrimal duct) carries tears from the lacrimal sac down into the back of the nose to the throat. Dacryocystitis can be sudden (acute) or long-lasting (chronic). It usually affects only one eye. What are the causes? The most common cause of this condition is a blocked nasolacrimal duct. When this duct is blocked, tears cannot drain into the nose, and tears become backed up in the lacrimal sac. Bacteria that normally live in the eye, on the skin, or in the nose start to grow inside the sac and cause infection. The nasolacrimal duct may become blocked because of: A nose or sinus infection that spreads into the duct. A duct that is abnormally shaped (malformed). A growth or swelling in the nose. An injury or surgery that narrows or scars the duct. Dacryocystitis also may start as an eye infection that spreads to the lacrimal sac. Sometimes, the cause of dacryocystitis is not known. What increases the risk? You are  more likely to develop this condition if you: Are older than 48 years of age. Are female. Women tend to have a narrower nasolacrimal duct than men. Have had nasal trauma, such as a broken nose or nasal surgery. Have nasal polyps. Use certain medicines, including some nasal sprays and eye drops. Many babies are born with some form of a blocked nasolacrimal duct that causes extra tearing, but most grow out of it by age 67. In rare cases, it can lead to an infection of the lacrimal sac. What are the signs or symptoms? Symptoms of acute dacryocystitis start suddenly and may include: Excessive tearing. A matted, watery eye. Swelling and redness over the lacrimal sac. Discharge of mucus or pus into the eye. This may cause blurred vision. Eye pain. A fever. Symptoms of chronic dacryocystitis usually include: More tearing than usual. Discharge of mucus or pus into the eye. Blurred vision. Redness, pain, and swelling are less common with chronic dacryocystitis. How is this diagnosed? This condition is diagnosed based on your medical history and a physical exam. During the exam, your health care provider may press between your eye and the side of your nose to see if discharge flows back onto your eye. You may also have tests, such as: Removal of a sample of discharge from your eye or nose to check for infection. A test where your health care provider will put a yellow dye in your eye to see if the dye disappears from your eye (dye disappearance test).  A swab may be placed in your nose to see if the dye drains to your nose. A test where a thin, lighted scope (endoscope) is placed in your nose to determine what is causing the duct blockage (nasal endoscopy). How is this treated? Acute dacryocystitis is treated with antibiotic medicines. These are usually given by mouth (orally), but they can also be given as eye drops or ointments. If the infection has spread to tissues around the eye (orbital  cellulitis), antibiotics may be given through an IV. Chronic dacryocystitis usually needs to be treated with surgery. Surgical options include: Probing the duct to open it. Widening the duct. Removing a nasal blockage. In babies with a blocked nasolacrimal duct, massages of the eyelids near the nose may be helpful. Follow these instructions at home: Medicines Take over-the-counter and prescription medicines only as told by your health care provider. If you were prescribed an antibiotic medicine, take or apply it as told by your health care provider. Do not stop using the antibiotic even if you start to feel better. General instructions If directed by your health care provider, apply a clean, warm compress to the inside corner of your eye. To do this: Wash your hands first. Hold the compress over the inside corner of your eye for a few minutes. Repeat this every few hours during the day. Keep all follow-up visits. This is important. Contact a health care provider if: You have a fever. Your symptoms come back, do not improve, or get worse. Get help right away if you have: Redness, swelling, and pain that spread to the tissues around your eye. A sudden decrease in your vision. Summary Dacryocystitis can be sudden (acute) or long-lasting (chronic). The most common cause of this condition is a blocked nasolacrimal duct. Acute dacryocystitis is treated with antibiotic medicines. Chronic dacryocystitis usually needs to be treated with surgery. Keep all follow-up visits. This is important. This information is not intended to replace advice given to you by your health care provider. Make sure you discuss any questions you have with your health care provider. Document Revised: 06/30/2021 Document Reviewed: 06/30/2021 Elsevier Patient Education  [place].

## 2021-12-09 NOTE — Progress Notes (Signed)
Subjective:    Patient ID: Lindsey Macias, female    DOB: [birth year], 45 y.o.   MRN: [id number]  HPI Pt has some left upper lid swelling near medial portion of the lid.  Pt state symptoms started about 10 days ago. Ares irritated for about 2-3 days. Then it came back end of last week. Pt notes if massage medial upper eye lid the area feels better. Some pain also medial upper nose.  Pt has been gaining weight. She in past I had written metformin and she stopped. She states did help her to loose weight. She had stopped in past when metformin xr was pulled off the market. She also joined a weight loss. Pt failed [employer] and [employer].  Pt has extreme stress with work/being overworked. Feels moderate to high level stress due to work. Now has help that should take burden off her.  Review of Systems  Constitutional:  Negative for appetite change, diaphoresis and fatigue.  HENT:  Negative for congestion and ear discharge.   Eyes:        See  hpi.  Respiratory:  Negative for cough, chest tightness, shortness of breath and wheezing.   Cardiovascular:  Negative for chest pain and palpitations.  Gastrointestinal:  Negative for abdominal pain.  Genitourinary:  Negative for dysuria, flank pain and frequency.  Musculoskeletal:  Negative for back pain, joint swelling and neck pain.  Skin:  Negative for rash.  Neurological:  Negative for dizziness, speech difficulty, numbness and headaches.  Hematological:  Negative for adenopathy. Does not bruise/bleed easily.  Psychiatric/Behavioral:  Negative for behavioral problems, confusion and dysphoric mood. The patient is nervous/anxious.     Past Medical History:  Diagnosis Date   Allergy    Cancer ([place]) 2003   cervical cancer 2002 and 2003. had leep procedure.   GERD (gastroesophageal reflux disease)    but actually had h pylori and took meds then got better.   Varicose veins of both lower extremities      Social History   Socioeconomic History    Marital status: Divorced    Spouse name: Not on file   Number of children: Not on file   Years of education: Not on file   Highest education level: Not on file  Occupational History   Not on file  Tobacco Use   Smoking status: Former    Packs/day: 0.00    Types: Cigarettes    Start date: 11/23/1994    Quit date: 07/28/2017    Years since quitting: 4.3   Smokeless tobacco: Never  Vaping Use   Vaping Use: Never used  Substance and Sexual Activity   Alcohol use: Yes    Comment: Pt states she drinks 2-3 drinks per year.   Drug use: No   Sexual activity: Not Currently    Comment: intercourse age 65, more than 5 sexual partners  Other Topics Concern   Not on file  Social History Narrative   Not on file   Social Determinants of Health   Financial Resource Strain: Not on file  Food Insecurity: Not on file  Transportation Needs: Not on file  Physical Activity: Not on file  Stress: Not on file  Social Connections: Not on file  Intimate Partner Violence: Not on file    Past Surgical History:  Procedure Laterality Date   Bunions surgery Right 2010 and 2012   ENDOVENOUS ABLATION SAPHENOUS VEIN W/ LASER Right 08/02/2019   endovenous laser ablation right greater saphenous vein by Ruta Hinds  MD    SHOULDER SURGERY Right 2012   TONSILLECTOMY Bilateral 2012    Family History  Problem Relation Age of Onset   Cancer Father        Brain   Diabetes Maternal Grandmother     Allergies  Allergen Reactions   Vicodin [Hydrocodone-Acetaminophen] Photosensitivity   Dust Mite Extract     Sneezing,Watery eyes,Runny nose   Percocet [Oxycodone-Acetaminophen]    Sulfa Antibiotics     Upset stomach    Current Outpatient Medications on File Prior to Visit  Medication Sig Dispense Refill   aspirin EC 81 MG EC tablet Take 1 tablet (81 mg total) by mouth daily.     azelastine (OPTIVAR) 0.05 % ophthalmic solution Place 1 drop into both eyes 2 (two) times daily. 6 mL 11   Azelastine  HCl 137 MCG/SPRAY SOLN SPRAY TWO SPRAYS IN EACH NOSTRIL TWICE DAILY 90 mL 2   eletriptan (RELPAX) 20 MG tablet Take 1 tablet (20 mg total) by mouth as needed for migraine or headache. May repeat in 2 hours if headache persists or recurs. 10 tablet 0   esomeprazole (NEXIUM) 40 MG capsule TAKE ONE CAPSULE BY MOUTH DAILY 90 capsule 3   levonorgestrel (MIRENA) 20 MCG/24HR IUD 1 each by Intrauterine route once.     mometasone (NASONEX) 50 MCG/ACT nasal spray 2 sprays twice daily 1 each 11   No current facility-administered medications on file prior to visit.    BP (!) 154/84    Pulse 98    Resp 18    Ht 5' 3"  (1.6 m)    Wt 186 lb (84.4 kg)    SpO2 100%    BMI 32.95 kg/m       Objective:   Physical Exam  General- No acute distress. Pleasant patient. Neck- Full range of motion, no jvd Lungs- Clear, even and unlabored. Heart- regular rate and rhythm. Neurologic- CNII- XII grossly intact.  Left eye- medial upper eye lid area faint mild swollen. Also medial aspect of upper nose faint tender at level of medial canthus.      Assessment & Plan:  You appear to have dacroystitis vs stye. I am favoring dacrycystitis. Will rx tobrex eye solution and advise warm compresses to upper lid/massages daily. If you symptoms worsen may need to give oral antibiotic and refer to eye MD.  For weight loss will prescribe metformin 500 mg twice a day but start out once a day for first 2 weeks then advance to twice a day.    Follow up in 7 days or sooner if needed.   Mackie Pai, PA-C
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
## 2021-12-17 ENCOUNTER — Ambulatory Visit: Payer: Managed Care, Other (non HMO) | Admitting: Psychiatry

## 2021-12-19 ENCOUNTER — Encounter: Payer: Self-pay | Admitting: Medical

## 2021-12-22 ENCOUNTER — Telehealth: Payer: Self-pay | Admitting: *Deleted

## 2021-12-22 ENCOUNTER — Other Ambulatory Visit (INDEPENDENT_AMBULATORY_CARE_PROVIDER_SITE_OTHER): Payer: Managed Care, Other (non HMO)

## 2021-12-22 ENCOUNTER — Other Ambulatory Visit: Payer: Managed Care, Other (non HMO)

## 2021-12-22 DIAGNOSIS — R944 Abnormal results of kidney function studies: Secondary | ICD-10-CM

## 2021-12-22 DIAGNOSIS — H04302 Unspecified dacryocystitis of left lacrimal passage: Secondary | ICD-10-CM | POA: Diagnosis not present

## 2021-12-22 NOTE — Telephone Encounter (Signed)
Left message on machine to call back to reschedule lab only appt.

## 2021-12-22 NOTE — Telephone Encounter (Signed)
Caller Name [place] [facility] Phone Number [phone] Patient Name Lindsey Macias Patient DOB 1976/05/13 Call Type Message Only Information Provided Reason for Call Request to Reschedule Office Appointment Initial Comment Caller states has a 730 appt has to reschedule due to having a flat tire. Patient request to speak to RN No Additional Comment Office hours provided. Disp. Time Disposition Final User 12/22/2021 7:31:28 AM General Information Provided Yes Ky Barban Call Closed By: Ky Barban Transaction Date/Time: 12/22/2021 7:28:32 AM (ET

## 2021-12-23 LAB — COMPREHENSIVE METABOLIC PANEL
ALT: 26 U/L (ref 0–35)
AST: 25 U/L (ref 0–37)
Albumin: 4.7 g/dL (ref 3.5–5.2)
Alkaline Phosphatase: 84 U/L (ref 39–117)
BUN: 11 mg/dL (ref 6–23)
CO2: 26 mEq/L (ref 19–32)
Calcium: 9.7 mg/dL (ref 8.4–10.5)
Chloride: 102 mEq/L (ref 96–112)
Creatinine, Ser: 0.97 mg/dL (ref 0.40–1.20)
GFR: 69.76 mL/min (ref >60.00)
Glucose, Bld: 76 mg/dL (ref 70–99)
Potassium: 4.3 mEq/L (ref 3.5–5.1)
Sodium: 136 mEq/L (ref 135–145)
Total Bilirubin: 0.7 mg/dL (ref 0.2–1.2)
Total Protein: 7.1 g/dL (ref 6.0–8.3)

## 2022-01-04 ENCOUNTER — Encounter (HOSPITAL_BASED_OUTPATIENT_CLINIC_OR_DEPARTMENT_OTHER): Payer: Self-pay | Admitting: *Deleted

## 2022-01-04 ENCOUNTER — Other Ambulatory Visit: Payer: Self-pay

## 2022-01-04 ENCOUNTER — Emergency Department (HOSPITAL_BASED_OUTPATIENT_CLINIC_OR_DEPARTMENT_OTHER)
Admission: EM | Admit: 2022-01-04 | Discharge: 2022-01-04 | Disposition: A | Discharge status: Home / Self Care | Payer: Managed Care, Other (non HMO) | Attending: Emergency Medicine | Admitting: Emergency Medicine

## 2022-01-04 DIAGNOSIS — Z7982 Long term (current) use of aspirin: Secondary | ICD-10-CM | POA: Insufficient documentation

## 2022-01-04 DIAGNOSIS — L02412 Cutaneous abscess of left axilla: Secondary | ICD-10-CM | POA: Insufficient documentation

## 2022-01-04 MED ORDER — LIDOCAINE-EPINEPHRINE (PF) 2 %-1:200000 IJ SOLN
10.0000 mL | Freq: Once | INTRAMUSCULAR | Status: AC
  Administered 2022-01-04: 10 mL via INTRADERMAL
  Filled 2022-01-04: qty 20

## 2022-01-04 MED ORDER — DOXYCYCLINE HYCLATE 100 MG PO CAPS: 100.0000 mg | ORAL_CAPSULE | Freq: Two times a day (BID) | ORAL | 0 refills | Status: DC

## 2022-01-04 NOTE — ED Triage Notes (Signed)
Pt reports she began to get boils in her armpits a few days ago. Swelling has grown since then especially in L axilla with 2 1" areas of redness and swelling. Hx of having abscesses lanced.

## 2022-01-04 NOTE — Discharge Instructions (Signed)
Please return to the ED with any new or worsening signs or symptoms such as fever, nausea, vomiting, diarrhea, body aches, chills Please pick up the antibiotic I prescribed for you. Please follow-up with your PCP in the event that these abscesses begin to repeatedly occur

## 2022-01-04 NOTE — ED Notes (Signed)
Irritated areas in bilateral axilla, left worse than right.

## 2022-01-04 NOTE — ED Triage Notes (Deleted)
Pt reports right side flank pain since Thursday. Went to Urgent Care today and was sent here to r/o appy bc she also had RLQ tenderness

## 2022-01-04 NOTE — ED Provider Notes (Signed)
[facility] Provider Note   CSN: [id number] Arrival date & time: 01/04/22  1721     History  Chief Complaint  Patient presents with   Abscess    axillary    Lindsey Macias is a 46 y.o. female with with medical history significant for GERD, varicose veins.  Patient states that she was in her usual state of health when on Tuesday she noticed several abscesses to her left axillary area.  The patient states that the pain is constant and described as a "painful, burning".  The patient denies any history of this occurring in the past.  Patient states that she had recently shaved her armpits and she feels as if these are result of ingrown hairs.  Patient denies any fever, nausea, vomiting, diarrhea, body aches or chills.   Abscess Associated symptoms: no fever, no nausea and no vomiting       Home Medications Prior to Admission medications   Medication Sig Start Date End Date Taking? Authorizing Provider  doxycycline (VIBRAMYCIN) 100 MG capsule Take 1 capsule (100 mg total) by mouth 2 (two) times daily. 01/04/22  Yes Lindsey Cecil, PA-C  aspirin EC 81 MG EC tablet Take 1 tablet (81 mg total) by mouth daily. 09/29/17   Rhyne, Hulen Shouts, PA-C  azelastine (OPTIVAR) 0.05 % ophthalmic solution Place 1 drop into both eyes 2 (two) times daily. 01/15/20   Saguier, Percell Miller, PA-C  Azelastine HCl 137 MCG/SPRAY SOLN SPRAY TWO SPRAYS IN EACH NOSTRIL TWICE DAILY 10/14/21   Saguier, Percell Miller, PA-C  busPIRone (BUSPAR) 7.5 MG tablet Take 1 tablet (7.5 mg total) by mouth 2 (two) times daily. 12/09/21   Saguier, Percell Miller, PA-C  eletriptan (RELPAX) 20 MG tablet Take 1 tablet (20 mg total) by mouth as needed for migraine or headache. May repeat in 2 hours if headache persists or recurs. 07/25/19   Saguier, Percell Miller, PA-C  esomeprazole (NEXIUM) 40 MG capsule TAKE ONE CAPSULE BY MOUTH DAILY 10/29/21   Saguier, Percell Miller, PA-C  levonorgestrel (MIRENA) 20 MCG/24HR IUD 1 each by Intrauterine  route once. 02/23/20   [provider]  mometasone (NASONEX) 50 MCG/ACT nasal spray 2 sprays twice daily 09/17/20   Saguier, Percell Miller, PA-C  tobramycin (TOBREX) 0.3 % ophthalmic solution Place 2 drops into the left eye every 6 (six) hours. 12/09/21   Saguier, Percell Miller, PA-C      Allergies    Vicodin [hydrocodone-acetaminophen], Dust mite extract, Percocet [oxycodone-acetaminophen], and Sulfa antibiotics    Review of Systems   Review of Systems  Constitutional:  Negative for chills and fever.  Gastrointestinal:  Negative for abdominal pain, diarrhea, nausea and vomiting.  Skin:        2 abscesses to left axilla  All other systems reviewed and are negative.  Physical Exam Updated Vital Signs BP 129/89    Pulse 95    Temp 98.1 F (36.7 C)    Resp 16    SpO2 97%  Physical Exam Vitals and nursing note reviewed.  Constitutional:      General: She is not in acute distress.    Appearance: She is normal weight. She is not ill-appearing, toxic-appearing or diaphoretic.  HENT:     Mouth/Throat:     Mouth: Mucous membranes are moist.  Eyes:     Extraocular Movements: Extraocular movements intact.     Pupils: Pupils are equal, round, and reactive to light.  Cardiovascular:     Rate and Rhythm: Normal rate and regular rhythm.  Pulmonary:  Effort: Pulmonary effort is normal.     Breath sounds: No stridor. No wheezing, rhonchi or rales.  Abdominal:     General: Abdomen is flat. There is no distension.     Palpations: Abdomen is soft. There is no mass.     Tenderness: There is no abdominal tenderness. There is no guarding.  Musculoskeletal:     Cervical back: Normal range of motion and neck supple. No rigidity or tenderness.  Skin:    General: Skin is warm and dry.     Capillary Refill: Capillary refill takes less than 2 seconds.     Findings: Abscess present.     Comments: 2 abscesses noted to left axilla.  1 abscess 3 cm, the other abscess 1 cm.  Both abscesses have surrounding  erythema.  Neither abscess is draining.  Neurological:     Mental Status: She is alert.    ED Results / Procedures / Treatments   Labs (all labs ordered are listed, but only abnormal results are displayed) Labs Reviewed - No data to display  EKG None  Radiology No results found.  Procedures .Marland KitchenIncision and Drainage  Date/Time: 01/04/2022 7:09 PM Performed by: Lindsey Cecil, PA-C Authorized by: Lindsey Cecil, PA-C   Consent:    Consent obtained:  Verbal   Consent given by:  Patient   Risks, benefits, and alternatives were discussed: yes     Risks discussed:  Bleeding, incomplete drainage, pain and infection   Alternatives discussed:  No treatment Universal protocol:    Procedure explained and questions answered to patient or proxy's satisfaction: yes     Patient identity confirmed:  Verbally with patient and arm band Location:    Type:  Abscess   Size:  3   Location: Left axilla. Pre-procedure details:    Skin preparation:  Chlorhexidine with alcohol and povidone-iodine Sedation:    Sedation type:  None Anesthesia:    Anesthesia method:  Local infiltration   Local anesthetic:  Lidocaine 2% WITH epi Procedure type:    Complexity:  Simple Procedure details:    Ultrasound guidance: no     Incision types:  Stab incision   Incision depth:  Dermal   Wound management:  Probed and deloculated and irrigated with saline   Drainage:  Bloody, purulent and serosanguinous   Wound treatment:  Wound left open   Packing materials:  1/4 in iodoform gauze   Amount 1/4" iodoform:  3 Post-procedure details:    Procedure completion:  Tolerated well, no immediate complications .Marland KitchenIncision and Drainage  Date/Time: 01/04/2022 7:10 PM Performed by: Lindsey Cecil, PA-C Authorized by: Lindsey Cecil, PA-C   Consent:    Consent obtained:  Verbal   Consent given by:  Patient   Risks, benefits, and alternatives were discussed: yes     Risks discussed:   Bleeding, incomplete drainage, damage to other organs and pain   Alternatives discussed:  No treatment Universal protocol:    Procedure explained and questions answered to patient or proxy's satisfaction: yes     Patient identity confirmed:  Verbally with patient and arm band Location:    Type:  Abscess   Size:  1cm   Location: Left axilla. Pre-procedure details:    Skin preparation:  Chlorhexidine with alcohol and povidone-iodine Sedation:    Sedation type:  None Anesthesia:    Anesthesia method:  Local infiltration   Local anesthetic:  Lidocaine 2% WITH epi Procedure type:    Complexity:  Simple Procedure details:  Incision types:  Stab incision   Incision depth:  Dermal   Wound management:  Probed and deloculated and irrigated with saline   Drainage:  Bloody, serosanguinous and purulent   Drainage amount:  Moderate   Wound treatment:  Wound left open   Packing materials:  None Post-procedure details:    Procedure completion:  Tolerated well, no immediate complications    Medications Ordered in ED Medications  lidocaine-EPINEPHrine (XYLOCAINE W/EPI) 2 %-1:200000 (PF) injection 10 mL (has no administration in time range)    ED Course/ Medical Decision Making/ A&P                           Medical Decision Making Risk Prescription drug management.   46 year old female presents with 2 abscesses to left axilla.  Patient denies any systemic infectious symptoms such as fever, nausea, vomiting, diarrhea.  Patient abscesses were incised and drained per the procedure note.  Patient tolerated procedure well.  Due to the location and number of abscesses, I will place this patient on doxycycline for 7 days.  I have given the patient return precautions and she voices understanding.  All the patient's questions were answered to her satisfaction.  The patient is stable at the time of discharge.   Final Clinical Impression(s) / ED Diagnoses Final diagnoses:  Abscess of left  axilla    Rx / DC Orders ED Discharge Orders          Ordered    doxycycline (VIBRAMYCIN) 100 MG capsule  2 times daily        01/04/22 1913              Lawana Chambers 71/85/50 1586    Lianne Cure, DO 82/57/49 1119

## 2022-01-04 NOTE — ED Notes (Signed)
Lidocaine-Epi left in the room for the provider.

## 2022-01-05 ENCOUNTER — Other Ambulatory Visit: Payer: Self-pay | Admitting: Medical

## 2022-01-05 ENCOUNTER — Ambulatory Visit (INDEPENDENT_AMBULATORY_CARE_PROVIDER_SITE_OTHER): Payer: Managed Care, Other (non HMO) | Admitting: Medical

## 2022-01-05 VITALS — BP 111/66 | HR 97 | Temp 98.3°F | Resp 18 | Ht 63.0 in | Wt 181.0 lb

## 2022-01-05 DIAGNOSIS — L089 Local infection of the skin and subcutaneous tissue, unspecified: Secondary | ICD-10-CM

## 2022-01-05 DIAGNOSIS — L732 Hidradenitis suppurativa: Secondary | ICD-10-CM

## 2022-01-05 MED ORDER — FLUCONAZOLE 150 MG PO TABS: ORAL_TABLET | ORAL | 0 refills | Status: DC

## 2022-01-05 MED ORDER — CEFTRIAXONE SODIUM 1 G IJ SOLR
1.0000 g | Freq: Once | INTRAMUSCULAR | Status: AC
  Administered 2022-01-05: 1 g via INTRAMUSCULAR

## 2022-01-05 NOTE — Patient Instructions (Addendum)
Skin infection with recent abscess. Will get wound culture today and have you start doxycycline. Rx advisement. Rocephin 1 gram IM today.   Warm salt water compress twice daily.  If area worsens or changes let us know. If appearing like abscess reforming or more infection then would consider referral to general surgeon for second I and D though that is unlikely.   Follow up in 7 days or sooner if needed.

## 2022-01-05 NOTE — Addendum Note (Signed)
Addended by: Jeronimo Greaves on: 01/05/2022 09:05 AM   Modules accepted: Orders

## 2022-01-05 NOTE — Progress Notes (Signed)
Subjective:    Patient ID: Lindsey Macias, female    DOB: Feb 20, 1976, 46 y.o.   MRN: [id number]  HPI  Pt states recently had left axillary abscess that came up quickly on Tuesday after shaving with new razor that she only used twice.  Pt states initially the area was only itching. At first pt thought was only ingrown hairs but the are raised up quickly.   In the past if would get small bumps would put otc triple antibiotic.  She went to ED yesterday.   Physical exam in ED.  Skin:    General: Skin is warm and dry.     Capillary Refill: Capillary refill takes less than 2 seconds.     Findings: Abscess present.     Comments: 2 abscesses noted to left axilla.  1 abscess 3 cm, the other abscess 1 cm.  Both abscesses have surrounding erythema.  Neither abscess is draining.     A/P 46 year old female presents with 2 abscesses to left axilla.  Patient denies any systemic infectious symptoms such as fever, nausea, vomiting, diarrhea.   Patient abscesses were incised and drained per the procedure note.  Patient tolerated procedure well.   Due to the location and number of abscesses, I will place this patient on doxycycline for 7 days.  I have given the patient return precautions and she voices understanding.  All the patient's questions were answered to her satisfaction.  The patient is stable at the time of discharge.    Review of Systems  Constitutional:  Negative for chills, fatigue and fever.  Respiratory:  Negative for cough, chest tightness, shortness of breath and wheezing.   Cardiovascular:  Negative for chest pain and palpitations.  Gastrointestinal:  Negative for abdominal pain, constipation and nausea.  Skin:  Positive for rash.       See hpi.  Neurological:  Negative for dizziness and numbness.  Hematological:  Negative for adenopathy. Does not bruise/bleed easily.  Psychiatric/Behavioral:  Negative for behavioral problems and decreased concentration.    Past Medical  History:  Diagnosis Date   Allergy    Cancer ([place]) 2003   cervical cancer 2002 and 2003. had leep procedure.   GERD (gastroesophageal reflux disease)    but actually had h pylori and took meds then got better.   Varicose veins of both lower extremities      Social History   Socioeconomic History   Marital status: Divorced    Spouse name: Not on file   Number of children: Not on file   Years of education: Not on file   Highest education level: Not on file  Occupational History   Not on file  Tobacco Use   Smoking status: Former    Packs/day: 0.00    Types: Cigarettes    Start date: 11/23/1994    Quit date: 07/28/2017    Years since quitting: 4.4   Smokeless tobacco: Never  Vaping Use   Vaping Use: Never used  Substance and Sexual Activity   Alcohol use: Yes    Comment: Pt states she drinks 2-3 drinks per year.   Drug use: No   Sexual activity: Not Currently    Comment: intercourse age 5, more than 5 sexual partners  Other Topics Concern   Not on file  Social History Narrative   Not on file   Social Determinants of Health   Financial Resource Strain: Not on file  Food Insecurity: Not on file  Transportation Needs: Not on file  Physical Activity: Not on file  Stress: Not on file  Social Connections: Not on file  Intimate Partner Violence: Not on file    Past Surgical History:  Procedure Laterality Date   Bunions surgery Right 2010 and 2012   ENDOVENOUS ABLATION SAPHENOUS VEIN W/ LASER Right 08/02/2019   endovenous laser ablation right greater saphenous vein by Ruta Hinds MD    SHOULDER SURGERY Right 2012   TONSILLECTOMY Bilateral 2012    Family History  Problem Relation Age of Onset   Cancer Father        Brain   Diabetes Maternal Grandmother     Allergies  Allergen Reactions   Vicodin [Hydrocodone-Acetaminophen] Photosensitivity   Dust Mite Extract     Sneezing,Watery eyes,Runny nose   Percocet [Oxycodone-Acetaminophen]    Sulfa Antibiotics      Upset stomach    Current Outpatient Medications on File Prior to Visit  Medication Sig Dispense Refill   aspirin EC 81 MG EC tablet Take 1 tablet (81 mg total) by mouth daily.     azelastine (OPTIVAR) 0.05 % ophthalmic solution Place 1 drop into both eyes 2 (two) times daily. 6 mL 11   Azelastine HCl 137 MCG/SPRAY SOLN SPRAY TWO SPRAYS IN EACH NOSTRIL TWICE DAILY 90 mL 2   busPIRone (BUSPAR) 7.5 MG tablet Take 1 tablet (7.5 mg total) by mouth 2 (two) times daily. 60 tablet 0   doxycycline (VIBRAMYCIN) 100 MG capsule Take 1 capsule (100 mg total) by mouth 2 (two) times daily. 14 capsule 0   eletriptan (RELPAX) 20 MG tablet Take 1 tablet (20 mg total) by mouth as needed for migraine or headache. May repeat in 2 hours if headache persists or recurs. 10 tablet 0   esomeprazole (NEXIUM) 40 MG capsule TAKE ONE CAPSULE BY MOUTH DAILY 90 capsule 3   levonorgestrel (MIRENA) 20 MCG/24HR IUD 1 each by Intrauterine route once.     mometasone (NASONEX) 50 MCG/ACT nasal spray 2 sprays twice daily 1 each 11   tobramycin (TOBREX) 0.3 % ophthalmic solution Place 2 drops into the left eye every 6 (six) hours. 5 mL 0   No current facility-administered medications on file prior to visit.    BP 111/66    Pulse 97    Temp 98.3 F (36.8 C)    Resp 18    Ht 5' 3"  (1.6 m)    Wt 181 lb (82.1 kg)    SpO2 98%    BMI 32.06 kg/m        Objective:   Physical Exam  General- No acute distress. Pleasant patient. Neck- Full range of motion, no jvd Lungs- Clear, even and unlabored. Heart- regular rate and rhythm. Neurologic- CNII- XII grossly intact.  Left axillary- skin infection. By ED not states abscess. I and D done. Area has pinkish appearance to abscess area. Packing from incision sticking out.        Assessment & Plan:   Patient Instructions  Skin infection with recent abscess. Will get wound culture today and have you start doxycycline. Rx advisement. Rocephin 1 gram IM today.   Warm salt water  compress twice daily.  If area worsens or changes let us know. If appearing like abscess reforming or more infection then would consider referral to general surgeon for second I and D though that is unlikely.   Follow up in 7 days or sooner if needed.   Mackie Pai, PA-C

## 2022-01-08 ENCOUNTER — Other Ambulatory Visit: Payer: Self-pay

## 2022-01-08 ENCOUNTER — Telehealth: Payer: Self-pay

## 2022-01-08 LAB — WOUND CULTURE
MICRO NUMBER:: 12905265
SPECIMEN QUALITY:: ADEQUATE

## 2022-01-08 MED ORDER — FLUCONAZOLE 150 MG PO TABS: ORAL_TABLET | ORAL | 0 refills | Status: DC

## 2022-01-08 MED ORDER — CLINDAMYCIN HCL 150 MG PO CAPS: 150.0000 mg | ORAL_CAPSULE | Freq: Three times a day (TID) | ORAL | 0 refills | Status: DC

## 2022-01-08 MED ORDER — METFORMIN HCL 500 MG PO TABS: 500.0000 mg | ORAL_TABLET | Freq: Two times a day (BID) | ORAL | 3 refills | Status: DC

## 2022-01-08 NOTE — Telephone Encounter (Signed)
Pt stated yall discussed her starting metformin but the medication was never sent to the pharmacy . Pt wants script sent to [facility]

## 2022-01-08 NOTE — Addendum Note (Signed)
Addended by: Anabel Halon on: 01/08/2022 10:35 AM   Modules accepted: Orders

## 2022-01-08 NOTE — Addendum Note (Signed)
Addended by: Anabel Halon on: 01/08/2022 06:13 PM   Modules accepted: Orders

## 2022-01-09 NOTE — Telephone Encounter (Signed)
Pt.notified

## 2022-02-01 ENCOUNTER — Other Ambulatory Visit: Payer: Self-pay | Admitting: Medical

## 2022-03-06 ENCOUNTER — Encounter: Payer: Self-pay | Admitting: Medical

## 2022-03-07 MED ORDER — AZELASTINE HCL 0.05 % OP SOLN: 1.0000 [drp] | Freq: Two times a day (BID) | OPHTHALMIC | 11 refills | Status: DC

## 2022-03-07 MED ORDER — MOMETASONE FUROATE 50 MCG/ACT NA SUSP: NASAL | 11 refills | Status: DC

## 2022-03-07 MED ORDER — AZELASTINE HCL 137 MCG/SPRAY NA SOLN: NASAL | 2 refills | Status: DC

## 2022-04-07 ENCOUNTER — Ambulatory Visit (INDEPENDENT_AMBULATORY_CARE_PROVIDER_SITE_OTHER): Payer: Managed Care, Other (non HMO) | Admitting: Medical

## 2022-04-07 VITALS — BP 115/70 | HR 93 | Temp 97.9°F | Resp 18 | Ht 63.0 in | Wt 181.4 lb

## 2022-04-07 DIAGNOSIS — H6981 Other specified disorders of Eustachian tube, right ear: Secondary | ICD-10-CM | POA: Diagnosis not present

## 2022-04-07 DIAGNOSIS — H9201 Otalgia, right ear: Secondary | ICD-10-CM | POA: Diagnosis not present

## 2022-04-07 DIAGNOSIS — J309 Allergic rhinitis, unspecified: Secondary | ICD-10-CM

## 2022-04-07 DIAGNOSIS — J3489 Other specified disorders of nose and nasal sinuses: Secondary | ICD-10-CM | POA: Diagnosis not present

## 2022-04-07 MED ORDER — METHYLPREDNISOLONE 4 MG PO TABS: ORAL_TABLET | ORAL | 0 refills | Status: DC

## 2022-04-07 MED ORDER — AZITHROMYCIN 250 MG PO TABS: ORAL_TABLET | ORAL | 0 refills | Status: AC

## 2022-04-07 NOTE — Patient Instructions (Addendum)
Allergic rhinitis history with recent rt ear pain. I think likely eustachian tube dysfunction. I do have concern for early  possible sinus infection and rt ear infection. Continue your 4 allergy meds and will add 6 day taper dose medrol. If ear pain or sinus symptoms worsen then add on azithromycin antibiotic. ? ?Follow up in 10-14 days or sooner if needed ?

## 2022-04-07 NOTE — Progress Notes (Signed)
? ?Subjective:  ? ? Patient ID: Lindsey Macias, female    DOB: 1976-11-09, 46 y.o.   MRN: [id number] ? ?HPI ? ?Pt states having ear pain rt side for one night. She states pain worse on swallowing. States pain not every time when swallows  today. Pt does describe some sinus pressure/maxillary sinus pain. Pt does have history of sinus infections. ? ? ?Currently using 2 nasal spray (astelin, mometasone,) zyrtec and optivar eye drops. ? ? ? ?Review of Systems  ?Constitutional:  Negative for chills, fatigue and fever.  ?HENT:  Positive for ear pain, postnasal drip and sinus pressure. Negative for congestion, nosebleeds, sinus pain and sore throat.   ?Respiratory:  Negative for choking, shortness of breath and wheezing.   ?Cardiovascular:  Negative for chest pain and palpitations.  ?Gastrointestinal:  Negative for abdominal pain.  ?Musculoskeletal:  Negative for back pain.  ?Hematological:  Negative for adenopathy. Does not bruise/bleed easily.  ? ? ?Past Medical History:  ?Diagnosis Date  ? Allergy   ? Cancer [facility]) 2003  ? cervical cancer 2002 and 2003. had leep procedure.  ? GERD (gastroesophageal reflux disease)   ? but actually had h pylori and took meds then got better.  ? Varicose veins of both lower extremities   ? ?  ?Social History  ? ?Socioeconomic History  ? Marital status: Divorced  ?  Spouse name: Not on file  ? Number of children: Not on file  ? Years of education: Not on file  ? Highest education level: Not on file  ?Occupational History  ? Not on file  ?Tobacco Use  ? Smoking status: Former  ?  Packs/day: 0.00  ?  Types: Cigarettes  ?  Start date: 11/23/1994  ?  Quit date: 07/28/2017  ?  Years since quitting: 4.6  ? Smokeless tobacco: Never  ?Vaping Use  ? Vaping Use: Never used  ?Substance and Sexual Activity  ? Alcohol use: Yes  ?  Comment: Pt states she drinks 2-3 drinks per year.  ? Drug use: No  ? Sexual activity: Not Currently  ?  Comment: intercourse age 27, more than 5 sexual partners  ?Other Topics  Concern  ? Not on file  ?Social History Narrative  ? Not on file  ? ?Social Determinants of Health  ? ?Financial Resource Strain: Not on file  ?Food Insecurity: Not on file  ?Transportation Needs: Not on file  ?Physical Activity: Not on file  ?Stress: Not on file  ?Social Connections: Not on file  ?Intimate Partner Violence: Not on file  ? ? ?Past Surgical History:  ?Procedure Laterality Date  ? Bunions surgery Right 2010 and 2012  ? ENDOVENOUS ABLATION SAPHENOUS VEIN W/ LASER Right 08/02/2019  ? endovenous laser ablation right greater saphenous vein by Ruta Hinds MD   ? SHOULDER SURGERY Right 2012  ? TONSILLECTOMY Bilateral 2012  ? ? ?Family History  ?Problem Relation Age of Onset  ? Cancer Father   ?     Brain  ? Diabetes Maternal Grandmother   ? ? ?Allergies  ?Allergen Reactions  ? Vicodin [Hydrocodone-Acetaminophen] Photosensitivity  ? Dust Mite Extract   ?  Sneezing,Watery eyes,Runny nose  ? Percocet [Oxycodone-Acetaminophen]   ? Sulfa Antibiotics   ?  Upset stomach  ? ? ?Current Outpatient Medications on File Prior to Visit  ?Medication Sig Dispense Refill  ? aspirin EC 81 MG EC tablet Take 1 tablet (81 mg total) by mouth daily.    ? azelastine (OPTIVAR) 0.05 %  ophthalmic solution Place 1 drop into both eyes 2 (two) times daily. 6 mL 11  ? Azelastine HCl 137 MCG/SPRAY SOLN SPRAY TWO SPRAYS IN EACH NOSTRIL TWICE DAILY 90 mL 2  ? busPIRone (BUSPAR) 7.5 MG tablet TAKE ONE TABLET BY MOUTH TWICE A DAY 60 tablet 0  ? clindamycin (CLEOCIN) 150 MG capsule Take 1 capsule (150 mg total) by mouth 3 (three) times daily. 21 capsule 0  ? eletriptan (RELPAX) 20 MG tablet Take 1 tablet (20 mg total) by mouth as needed for migraine or headache. May repeat in 2 hours if headache persists or recurs. 10 tablet 0  ? esomeprazole (NEXIUM) 40 MG capsule TAKE ONE CAPSULE BY MOUTH DAILY 90 capsule 3  ? fluconazole (DIFLUCAN) 150 MG tablet 1 tab po today and can repeat in 4 days 2 tablet 0  ? levonorgestrel (MIRENA) 20 MCG/24HR IUD  1 each by Intrauterine route once.    ? metFORMIN (GLUCOPHAGE) 500 MG tablet Take 1 tablet (500 mg total) by mouth 2 (two) times daily with a meal. 60 tablet 3  ? mometasone (NASONEX) 50 MCG/ACT nasal spray 2 sprays twice daily 1 each 11  ? tobramycin (TOBREX) 0.3 % ophthalmic solution Place 2 drops into the left eye every 6 (six) hours. 5 mL 0  ? ?No current facility-administered medications on file prior to visit.  ? ? ?BP 115/70   Pulse 93   Temp 97.9 ?F (36.6 ?C)   Resp 18   Ht 5' 3"  (1.6 m)   Wt 181 lb 6.4 oz (82.3 kg)   SpO2 96%   BMI 32.13 kg/m?  ?  ? ?   ?Objective:  ? Physical Exam ? ?General ?Mental Status- Alert. General Appearance- Not in acute distress.  ? ?Skin ?General: Color- Normal Color. Moisture- Normal Moisture. ? ?Neck ?Carotid Arteries- Normal color. Moisture- Normal Moisture. No carotid bruits. No JVD. ? ?Chest and Lung Exam ?Auscultation: ?Breath Sounds:-Normal. ? ?Cardiovascular ?Auscultation:Rythm- Regular. ?Murmurs & Other Heart Sounds:Auscultation of the heart reveals- No Murmurs. ? ?Abdomen ?Inspection:-Inspeection Normal. ?Palpation/Percussion:Note:No mass. Palpation and Percussion of the abdomen reveal- Non Tender, Non Distended + BS, no rebound or guarding. ? ?Neurologic ?Cranial Nerve exam:- CN III-XII intact(No nystagmus), symmetric smile. ?Strength:- 5/5 equal and symmetric strength both upper and lower extremities.  ? ?Heent- faint maxillary sinus pressure. Mild boggy turbinates. +pnd. Rt ear- mild tender area above rt tragus. Canal clear and rt tm normal. Left ear- mild central redness to tm. Canal not swollen. +pnd. ? ?   ?Assessment & Plan:  ? ?Patient Instructions  ?Allergic rhinitis history with recent rt ear pain. I think likely eustachian tube dysfunction. I do have concern for early  possible sinus infection and rt ear infection. Continue your 4 allergy meds and will add 6 day taper dose medrol. If ear pain or sinus symptoms worsen then add on azithromycin  antibiotic. ? ?Follow up in 10-14 days or sooner if needed  ? ?Mackie Pai, PA-C  ?

## 2022-07-17 ENCOUNTER — Ambulatory Visit (INDEPENDENT_AMBULATORY_CARE_PROVIDER_SITE_OTHER): Payer: Managed Care, Other (non HMO) | Admitting: Medical

## 2022-07-17 VITALS — BP 117/85 | HR 95 | Temp 98.3°F | Resp 20 | Ht 63.0 in | Wt 186.0 lb

## 2022-07-17 DIAGNOSIS — J342 Deviated nasal septum: Secondary | ICD-10-CM | POA: Diagnosis not present

## 2022-07-17 DIAGNOSIS — J0100 Acute maxillary sinusitis, unspecified: Secondary | ICD-10-CM | POA: Diagnosis not present

## 2022-07-17 DIAGNOSIS — L989 Disorder of the skin and subcutaneous tissue, unspecified: Secondary | ICD-10-CM | POA: Diagnosis not present

## 2022-07-17 DIAGNOSIS — H9203 Otalgia, bilateral: Secondary | ICD-10-CM | POA: Diagnosis not present

## 2022-07-17 MED ORDER — DOXYCYCLINE HYCLATE 100 MG PO TABS: 100.0000 mg | ORAL_TABLET | Freq: Two times a day (BID) | ORAL | 0 refills | Status: DC

## 2022-07-17 MED ORDER — FLUCONAZOLE 150 MG PO TABS: ORAL_TABLET | ORAL | 0 refills | Status: DC

## 2022-07-17 MED ORDER — METHYLPREDNISOLONE 4 MG PO TABS: ORAL_TABLET | ORAL | 0 refills | Status: DC

## 2022-07-17 NOTE — Progress Notes (Signed)
   Subjective:    Patient ID: Lindsey Macias, female    DOB: 1976-02-01, 46 y.o.   MRN: [id number]  HPI Pt in for rt ear pain for one month. She states with whooshing sound with pain.  History of allergic rhinitis and she states she wants ear infection.  On review 10-17 2022 had red tm in addtion to sinus infection.  09-17-2021 sinus infection with both ears infected at that time.  She mentions willing to consider ear tubes.   On review often with sinus infections will get ear infections.  Prior ct showed. 10/24/2021  IMPRESSION:  13 mm mucous retention cyst within the left sphenoid sinus.   Trace mucosal thickening within the bilateral maxillary sinuses.   Mucosal thickening mildly narrows the left maxillary sinus  ostium/ethmoid infundibulum.   The paranasal sinuses are otherwise normally aerated, and the sinus  drainage pathways are otherwise patent.   Rightward deviation of the nasal septum.     Rt medial calf small bump present for one year. And she thinks maybe large. Little tend and initially was ingrown hair. She picked at it with tweezers.   Also small area barely palpale bump on let side forehead.  Review of Systems     Objective:   Physical Exam  General Mental Status- Alert. General Appearance- Not in acute distress.     Neck Carotid Arteries- Normal color. Moisture- Normal Moisture. No carotid bruits. No JVD.  Chest and Lung Exam Auscultation: Breath Sounds:-Normal.  Cardiovascular Auscultation:Rythm- Regular. Murmurs & Other Heart Sounds:Auscultation of the heart reveals- No Murmurs.  Abdomen Inspection:-Inspeection Normal. Palpation/Percussion:Note:No mass. Palpation and Percussion of the abdomen reveal- Non Tender, Non Distended + BS, no rebound or guarding.    Neurologic Cranial Nerve exam:- CN III-XII intact(No nystagmus), symmetric smile. Strength:- 5/5 equal and symmetric strength both upper and lower extremities.   Heent-  maxillary and frontal sinus pressure. Both tm mild central redness. Septum does look deviated to rt.  Skin- rt medial calf small raised dermatofibroma like area.  Left forehead- small slight palpable area but not visible.    Assessment & Plan:   Patient Instructions  Chronic allergic rhinitis- continue astelin, nasonex and can add taper medrol over next 6 days.  Sinus infection-Rx doxycycline 100 mg twice daily for 10 days.   Mild dull tm both side centrally. May be developing early ear infection vs eustachian tube pressure.  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  Refer back to [employer].  For rt calf small  raised area I think dermatofibroma. Also small palpable bump on left side forehead but not well visualized. Due to mom history of skin cancer will refer to dermatologist.  Follow up 2 weeks or sooner if needed.   Mackie Pai, PA-C

## 2022-07-17 NOTE — Patient Instructions (Addendum)
Chronic allergic rhinitis- continue astelin, nasonex and can add taper medrol over next 6 days.  Sinus infection-Rx doxycycline 100 mg twice daily for 10 days.   Mild dull tm both side centrally. May be developing early ear infection vs eustachian tube pressure.  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  Refer back to [employer].  For rt calf small  raised area I think dermatofibroma. Also small palpable bump on left side forehead but not well visualized. Due to mom history of skin cancer will refer to dermatologist.  Follow up 2 weeks or sooner if needed.

## 2022-11-23 ENCOUNTER — Ambulatory Visit: Payer: Managed Care, Other (non HMO) | Admitting: Medical

## 2022-11-23 VITALS — BP 129/82 | HR 93 | Temp 98.2°F | Resp 18 | Ht 63.0 in | Wt 184.0 lb

## 2022-11-23 DIAGNOSIS — R059 Cough, unspecified: Secondary | ICD-10-CM | POA: Diagnosis not present

## 2022-11-23 DIAGNOSIS — R0981 Nasal congestion: Secondary | ICD-10-CM

## 2022-11-23 DIAGNOSIS — J029 Acute pharyngitis, unspecified: Secondary | ICD-10-CM

## 2022-11-23 DIAGNOSIS — J040 Acute laryngitis: Secondary | ICD-10-CM | POA: Diagnosis not present

## 2022-11-23 MED ORDER — AMOXICILLIN-POT CLAVULANATE 875-125 MG PO TABS: 1.0000 | ORAL_TABLET | Freq: Two times a day (BID) | ORAL | 0 refills | Status: DC

## 2022-11-23 MED ORDER — BENZONATATE 100 MG PO CAPS
100.0000 mg | ORAL_CAPSULE | Freq: Three times a day (TID) | ORAL | 0 refills | Status: DC | PRN
Start: 2022-11-23 — End: 2024-01-24

## 2022-11-23 NOTE — Progress Notes (Signed)
Subjective:    Patient ID: Lindsey Macias, female    DOB: January 12, 1976, 46 y.o.   MRN: [id number]  HPI  Pt in for recent hoarse voice with st throat since past Thursday. Sounds mild nasal congestion and feels like about to get chest congested.   Pt has sinus infections about 2-3 a year.  Pt states she used fireside home remedy to prevent sinus infections.  Hx of allergies- on both nasonex and astelin.    Review of Systems  Constitutional:  Negative for chills, fatigue and fever.  HENT:  Positive for congestion, sinus pressure and sinus pain.   Respiratory:  Positive for cough. Negative for chest tightness, shortness of breath and wheezing.   Cardiovascular:  Negative for chest pain and palpitations.  Gastrointestinal:  Negative for abdominal pain, diarrhea and nausea.  Musculoskeletal:  Negative for back pain, joint swelling and neck pain.  Neurological:  Negative for dizziness, numbness and headaches.  Hematological:  Negative for adenopathy. Does not bruise/bleed easily.   Past Medical History:  Diagnosis Date   Allergy    Cancer ([place]) 2003   cervical cancer 2002 and 2003. had leep procedure.   GERD (gastroesophageal reflux disease)    but actually had h pylori and took meds then got better.   Varicose veins of both lower extremities      Social History   Socioeconomic History   Marital status: Divorced    Spouse name: Not on file   Number of children: Not on file   Years of education: Not on file   Highest education level: Not on file  Occupational History   Not on file  Tobacco Use   Smoking status: Former    Packs/day: 0.00    Types: Cigarettes    Start date: 11/23/1994    Quit date: 07/28/2017    Years since quitting: 5.3   Smokeless tobacco: Never  Vaping Use   Vaping Use: Never used  Substance and Sexual Activity   Alcohol use: Yes    Comment: Pt states she drinks 2-3 drinks per year.   Drug use: No   Sexual activity: Not Currently    Comment:  intercourse age 12, more than 5 sexual partners  Other Topics Concern   Not on file  Social History Narrative   Not on file   Social Determinants of Health   Financial Resource Strain: Not on file  Food Insecurity: Not on file  Transportation Needs: Not on file  Physical Activity: Not on file  Stress: Not on file  Social Connections: Not on file  Intimate Partner Violence: Not on file    Past Surgical History:  Procedure Laterality Date   Bunions surgery Right 2010 and 2012   ENDOVENOUS ABLATION SAPHENOUS VEIN W/ LASER Right 08/02/2019   endovenous laser ablation right greater saphenous vein by Ruta Hinds MD    SHOULDER SURGERY Right 2012   TONSILLECTOMY Bilateral 2012    Family History  Problem Relation Age of Onset   Cancer Father        Brain   Diabetes Maternal Grandmother     Allergies  Allergen Reactions   Vicodin [Hydrocodone-Acetaminophen] Photosensitivity   Dust Mite Extract     Sneezing,Watery eyes,Runny nose   Percocet [Oxycodone-Acetaminophen]    Sulfa Antibiotics     Upset stomach    Current Outpatient Medications on File Prior to Visit  Medication Sig Dispense Refill   aspirin EC 81 MG EC tablet Take 1 tablet (81 mg total)  by mouth daily.     azelastine (OPTIVAR) 0.05 % ophthalmic solution Place 1 drop into both eyes 2 (two) times daily. 6 mL 11   Azelastine HCl 137 MCG/SPRAY SOLN SPRAY TWO SPRAYS IN EACH NOSTRIL TWICE DAILY 90 mL 2   doxycycline (VIBRA-TABS) 100 MG tablet Take 1 tablet (100 mg total) by mouth 2 (two) times daily. 20 tablet 0   eletriptan (RELPAX) 20 MG tablet Take 1 tablet (20 mg total) by mouth as needed for migraine or headache. May repeat in 2 hours if headache persists or recurs. 10 tablet 0   esomeprazole (NEXIUM) 40 MG capsule TAKE ONE CAPSULE BY MOUTH DAILY 90 capsule 3   fluconazole (DIFLUCAN) 150 MG tablet 1 tab day 2 and then repeat on day 5 if needed. 2 tablet 0   levonorgestrel (MIRENA) 20 MCG/24HR IUD 1 each by  Intrauterine route once.     methylPREDNISolone (MEDROL) 4 MG tablet Standard 6 day taper 21 tablet 0   mometasone (NASONEX) 50 MCG/ACT nasal spray 2 sprays twice daily 1 each 11   No current facility-administered medications on file prior to visit.    BP 129/82   Pulse 93   Temp 98.2 F (36.8 C)   Resp 18   Ht '5\' 3"'$  (1.6 m)   Wt 184 lb (83.5 kg)   SpO2 98%   BMI 32.59 kg/m        Objective:   Physical Exam   General Mental Status- Alert. General Appearance- Not in acute distress.   Skin General: Color- Normal Color. Moisture- Normal Moisture.  Neck Carotid Arteries- Normal color. Moisture- Normal Moisture. No carotid bruits. No JVD.  Chest and Lung Exam Auscultation: Breath Sounds:-Normal.  Cardiovascular Auscultation:Rythm- Regular. Murmurs & Other Heart Sounds:Auscultation of the heart reveals- No Murmurs.  Abdomen Inspection:-Inspeection Normal. Palpation/Percussion:Note:No mass. Palpation and Percussion of the abdomen reveal- Non Tender, Non Distended + BS, no rebound or guarding.   Neurologic Cranial Nerve exam:- CN III-XII intact(No nystagmus), symmetric smile. Strength:- 5/5 equal and symmetric strength both upper and lower extremities.   Heent- mild sinus pressure. Boggy turbinates. +pnd     Assessment & Plan:   Patient Instructions  Recent laryngitis, pharyngitis with hx of sinus infection.  Will prescribe augmentin and advise rest voice. Hydrate well.  Continue flonase and astelin.  Rx benzonatate for cough.  Follow up in 7-10 days or sooner if needed   [employer], PA-C

## 2022-11-23 NOTE — Patient Instructions (Addendum)
Recent laryngitis, pharyngitis with hx of sinus infection.  Will prescribe augmentin and advise rest voice. Hydrate well.  Continue flonase and astelin.  Rx benzonatate for cough.  Follow up in 7-10 days or sooner if needed

## 2022-12-05 ENCOUNTER — Other Ambulatory Visit: Payer: Self-pay | Admitting: Medical

## 2022-12-21 ENCOUNTER — Ambulatory Visit: Payer: Managed Care, Other (non HMO) | Admitting: Medical

## 2023-01-08 ENCOUNTER — Ambulatory Visit (INDEPENDENT_AMBULATORY_CARE_PROVIDER_SITE_OTHER): Payer: Managed Care, Other (non HMO) | Admitting: Medical

## 2023-01-08 VITALS — BP 120/74 | HR 100 | Temp 98.0°F | Resp 18 | Ht 63.0 in | Wt 190.8 lb

## 2023-01-08 DIAGNOSIS — Z23 Encounter for immunization: Secondary | ICD-10-CM

## 2023-01-08 DIAGNOSIS — M79671 Pain in right foot: Secondary | ICD-10-CM

## 2023-01-08 DIAGNOSIS — R739 Hyperglycemia, unspecified: Secondary | ICD-10-CM | POA: Diagnosis not present

## 2023-01-08 DIAGNOSIS — E669 Obesity, unspecified: Secondary | ICD-10-CM | POA: Diagnosis not present

## 2023-01-08 DIAGNOSIS — Z0000 Encounter for general adult medical examination without abnormal findings: Secondary | ICD-10-CM | POA: Diagnosis not present

## 2023-01-08 DIAGNOSIS — R5383 Other fatigue: Secondary | ICD-10-CM | POA: Diagnosis not present

## 2023-01-08 LAB — CBC WITH DIFFERENTIAL/PLATELET
Basophils Absolute: 0 10*3/uL (ref 0.0–0.1)
Basophils Relative: 0.2 % (ref 0.0–3.0)
Eosinophils Absolute: 0.1 10*3/uL (ref 0.0–0.7)
Eosinophils Relative: 1.2 % (ref 0.0–5.0)
HCT: 41.9 % (ref 36.0–46.0)
Hemoglobin: 14.3 g/dL (ref 12.0–15.0)
Lymphocytes Relative: 33.6 % (ref 12.0–46.0)
Lymphs Abs: 2.1 10*3/uL (ref 0.7–4.0)
MCHC: 34.2 g/dL (ref 30.0–36.0)
MCV: 90.6 fl (ref 78.0–100.0)
Monocytes Absolute: 0.4 10*3/uL (ref 0.1–1.0)
Monocytes Relative: 5.9 % (ref 3.0–12.0)
Neutro Abs: 3.6 10*3/uL (ref 1.4–7.7)
Neutrophils Relative %: 59.1 % (ref 43.0–77.0)
Platelets: 270 10*3/uL (ref 150.0–400.0)
RBC: 4.63 Mil/uL (ref 3.87–5.11)
RDW: 13.4 % (ref 11.5–15.5)
WBC: 6.1 10*3/uL (ref 4.0–10.5)

## 2023-01-08 LAB — COMPREHENSIVE METABOLIC PANEL
ALT: 33 U/L (ref 0–35)
AST: 24 U/L (ref 0–37)
Albumin: 4.4 g/dL (ref 3.5–5.2)
Alkaline Phosphatase: 87 U/L (ref 39–117)
BUN: 12 mg/dL (ref 6–23)
CO2: 23 mEq/L (ref 19–32)
Calcium: 9.2 mg/dL (ref 8.4–10.5)
Chloride: 107 mEq/L (ref 96–112)
Creatinine, Ser: 0.97 mg/dL (ref 0.40–1.20)
GFR: 69.25 mL/min (ref >60.00)
Glucose, Bld: 102 mg/dL — ABNORMAL HIGH (ref 70–99)
Potassium: 4.2 mEq/L (ref 3.5–5.1)
Sodium: 140 mEq/L (ref 135–145)
Total Bilirubin: 0.4 mg/dL (ref 0.2–1.2)
Total Protein: 6.7 g/dL (ref 6.0–8.3)

## 2023-01-08 LAB — TSH: TSH: 2.97 u[IU]/mL (ref 0.35–5.50)

## 2023-01-08 LAB — LDL CHOLESTEROL, DIRECT: Direct LDL: 181 mg/dL

## 2023-01-08 LAB — T4, FREE: Free T4: 0.72 ng/dL (ref 0.60–1.60)

## 2023-01-08 LAB — LIPID PANEL
Cholesterol: 248 mg/dL — ABNORMAL HIGH (ref 0–200)
HDL: 41.3 mg/dL (ref >39.00)
NonHDL: 206.22
Total CHOL/HDL Ratio: 6
Triglycerides: 224 mg/dL — ABNORMAL HIGH (ref 0.0–149.0)
VLDL: 44.8 mg/dL — ABNORMAL HIGH (ref 0.0–40.0)

## 2023-01-08 NOTE — Progress Notes (Addendum)
Subjective:    Patient ID: Lindsey Macias, female    DOB: 06/11/1976, 47 y.o.   MRN: [id number]  HPI  Pt works HR/payroll. She is trying to get eat better. Non smoker and rare alcohol. Exercising 3 days a week.   Flu vaccine this year.  Last pap 2021. She is getting repeat in 2024.  Pt getting colonoscopy done this year. Looks like done thru [facility] in past and they are doing again.  Pt is obese. She wants to lose weight. Pt has tried diet and exercise but keeps gaining weight. No hx of pancreatitis or fh of thyroid medullary cancer.     Review of Systems  Constitutional:  Positive for fatigue. Negative for chills and fever.  HENT:  Negative for congestion and drooling.   Respiratory:  Negative for cough, chest tightness and wheezing.   Cardiovascular:  Negative for chest pain and palpitations.  Gastrointestinal:  Negative for abdominal pain, blood in stool and nausea.  Genitourinary:  Negative for dysuria and flank pain.  Musculoskeletal:  Negative for back pain, joint swelling and neck pain.  Skin:  Negative for rash.  Neurological:  Negative for dizziness, numbness and headaches.  Hematological:  Negative for adenopathy. Does not bruise/bleed easily.  Psychiatric/Behavioral:  Negative for behavioral problems, decreased concentration, dysphoric mood and hallucinations.     Past Medical History:  Diagnosis Date   Allergy    Cancer ([place]) 2003   cervical cancer 2002 and 2003. had leep procedure.   GERD (gastroesophageal reflux disease)    but actually had h pylori and took meds then got better.   Varicose veins of both lower extremities      Social History   Socioeconomic History   Marital status: Divorced    Spouse name: Not on file   Number of children: Not on file   Years of education: Not on file   Highest education level: Not on file  Occupational History   Not on file  Tobacco Use   Smoking status: Former    Packs/day: 0.00    Types: Cigarettes     Start date: 11/23/1994    Quit date: 07/28/2017    Years since quitting: 5.4   Smokeless tobacco: Never  Vaping Use   Vaping Use: Never used  Substance and Sexual Activity   Alcohol use: Yes    Comment: Pt states she drinks 2-3 drinks per year.   Drug use: No   Sexual activity: Not Currently    Comment: intercourse age 14, more than 5 sexual partners  Other Topics Concern   Not on file  Social History Narrative   Not on file   Social Determinants of Health   Financial Resource Strain: Not on file  Food Insecurity: Not on file  Transportation Needs: Not on file  Physical Activity: Not on file  Stress: Not on file  Social Connections: Not on file  Intimate Partner Violence: Not on file    Past Surgical History:  Procedure Laterality Date   Bunions surgery Right 2010 and 2012   ENDOVENOUS ABLATION SAPHENOUS VEIN W/ LASER Right 08/02/2019   endovenous laser ablation right greater saphenous vein by Ruta Hinds MD    SHOULDER SURGERY Right 2012   TONSILLECTOMY Bilateral 2012    Family History  Problem Relation Age of Onset   Cancer Father        Brain   Diabetes Maternal Grandmother     Allergies  Allergen Reactions   Vicodin [Hydrocodone-Acetaminophen] Photosensitivity  Dust Mite Extract     Sneezing,Watery eyes,Runny nose   Percocet [Oxycodone-Acetaminophen]    Sulfa Antibiotics     Upset stomach    Current Outpatient Medications on File Prior to Visit  Medication Sig Dispense Refill   amoxicillin-clavulanate (AUGMENTIN) 875-125 MG tablet Take 1 tablet by mouth 2 (two) times daily. 20 tablet 0   aspirin EC 81 MG EC tablet Take 1 tablet (81 mg total) by mouth daily.     azelastine (OPTIVAR) 0.05 % ophthalmic solution Place 1 drop into both eyes 2 (two) times daily. 6 mL 11   Azelastine HCl 137 MCG/SPRAY SOLN SPRAY TWO SPRAYS IN EACH NOSTRIL TWICE DAILY 90 mL 2   benzonatate (TESSALON) 100 MG capsule Take 1 capsule (100 mg total) by mouth 3 (three) times daily  as needed for cough. 30 capsule 0   doxycycline (VIBRA-TABS) 100 MG tablet Take 1 tablet (100 mg total) by mouth 2 (two) times daily. 20 tablet 0   eletriptan (RELPAX) 20 MG tablet Take 1 tablet (20 mg total) by mouth as needed for migraine or headache. May repeat in 2 hours if headache persists or recurs. 10 tablet 0   esomeprazole (NEXIUM) 40 MG capsule TAKE ONE CAPSULE BY MOUTH DAILY 90 capsule 3   fluconazole (DIFLUCAN) 150 MG tablet 1 tab day 2 and then repeat on day 5 if needed. 2 tablet 0   levonorgestrel (MIRENA) 20 MCG/24HR IUD 1 each by Intrauterine route once.     methylPREDNISolone (MEDROL) 4 MG tablet Standard 6 day taper 21 tablet 0   mometasone (NASONEX) 50 MCG/ACT nasal spray 2 sprays twice daily 1 each 11   No current facility-administered medications on file prior to visit.    BP 120/74   Pulse 100   Temp 98 F (36.7 C)   Resp 18   Ht '5\' 3"'$  (1.6 m)   Wt 190 lb 12.8 oz (86.5 kg)   SpO2 96%   BMI 33.80 kg/m        Objective:   Physical Exam  General Mental Status- Alert. General Appearance- Not in acute distress.   Skin General: Color- Normal Color. Moisture- Normal Moisture.  Neck Carotid Arteries- Normal color. Moisture- Normal Moisture. No carotid bruits. No JVD.  Chest and Lung Exam Auscultation: Breath Sounds:-Normal.  Cardiovascular Auscultation:Rythm- Regular. Murmurs & Other Heart Sounds:Auscultation of the heart reveals- No Murmurs.  Abdomen Inspection:-Inspeection Normal. Palpation/Percussion:Note:No mass. Palpation and Percussion of the abdomen reveal- Non Tender, Non Distended + BS, no rebound or guarding.   Neurologic Cranial Nerve exam:- CN III-XII intact(No nystagmus), symmetric smile. Strength:- 5/5 equal and symmetric strength both upper and lower extremities.       Assessment & Plan:   Patient Instructions  For you wellness exam today I have ordered cbc, cmp and  lipid panel.  Flu vaccine today.  Recommend exercise and  healthy diet.  We will let you know lab results as they come in.  Follow up date appointment will be determined after lab review.      Mackie Pai, [place]   [place] charge.Discussed options for wt loss. Also some fatigue and mild sugar elevation. Get A1c and thyroid labs. Then may rx wegovy or ozempic.

## 2023-01-08 NOTE — Addendum Note (Signed)
Addended by: Anabel Halon on: 01/08/2023 09:09 AM   Modules accepted: Orders

## 2023-01-08 NOTE — Patient Instructions (Addendum)
For you wellness exam today I have ordered cbc, cmp and  lipid panel.  For fatigue get tsh and t4.  Flu vaccine today.  Upcoming pap smear and colonoscopy.  Pt states she will set up her mammogram. Asked if not with [facility] that she send [name] copy.  Recommend exercise and healthy diet.  We will let you know lab results as they come in.  Follow up date appointment will be determined after lab review.     Preventive Care 56-49 Years Old, Female Preventive care refers to lifestyle choices and visits with your health care provider that can promote health and wellness. Preventive care visits are also called wellness exams. What can I expect for my preventive care visit? Counseling Your health care provider may ask you questions about your: Medical history, including: Past medical problems. Family medical history. Pregnancy history. Current health, including: Menstrual cycle. Method of birth control. Emotional well-being. Home life and relationship well-being. Sexual activity and sexual health. Lifestyle, including: Alcohol, nicotine or tobacco, and drug use. Access to firearms. Diet, exercise, and sleep habits. Work and work Statistician. Sunscreen use. Safety issues such as seatbelt and bike helmet use. Physical exam Your health care provider will check your: Height and weight. These may be used to calculate your BMI (body mass index). BMI is a measurement that tells if you are at a healthy weight. Waist circumference. This measures the distance around your waistline. This measurement also tells if you are at a healthy weight and may help predict your risk of certain diseases, such as type 2 diabetes and high blood pressure. Heart rate and blood pressure. Body temperature. Skin for abnormal spots. What immunizations do I need?  Vaccines are usually given at various ages, according to a schedule. Your health care provider will recommend vaccines for you based on your age, medical  history, and lifestyle or other factors, such as travel or where you work. What tests do I need? Screening Your health care provider may recommend screening tests for certain conditions. This may include: Lipid and cholesterol levels. Diabetes screening. This is done by checking your blood sugar (glucose) after you have not eaten for a while (fasting). Pelvic exam and Pap test. Hepatitis B test. Hepatitis C test. HIV (human immunodeficiency virus) test. STI (sexually transmitted infection) testing, if you are at risk. Lung cancer screening. Colorectal cancer screening. Mammogram. Talk with your health care provider about when you should start having regular mammograms. This may depend on whether you have a family history of breast cancer. BRCA-related cancer screening. This may be done if you have a family history of breast, ovarian, tubal, or peritoneal cancers. Bone density scan. This is done to screen for osteoporosis. Talk with your health care provider about your test results, treatment options, and if necessary, the need for more tests. Follow these instructions at home: Eating and drinking  Eat a diet that includes fresh fruits and vegetables, whole grains, lean protein, and low-fat dairy products. Take vitamin and mineral supplements as recommended by your health care provider. Do not drink alcohol if: Your health care provider tells you not to drink. You are pregnant, may be pregnant, or are planning to become pregnant. If you drink alcohol: Limit how much you have to 0-1 drink a day. Know how much alcohol is in your drink. In the [place] one drink equals one 12 oz bottle of beer (355 mL), one 5 oz glass of wine (148 mL), or one 1 oz glass of hard liquor (  44 mL). Lifestyle Brush your teeth every morning and night with fluoride toothpaste. Floss one time each day. Exercise for at least 30 minutes 5 or more days each week. Do not use any products that contain nicotine or tobacco.  These products include cigarettes, chewing tobacco, and vaping devices, such as e-cigarettes. If you need help quitting, ask your health care provider. Do not use drugs. If you are sexually active, practice safe sex. Use a condom or other form of protection to prevent STIs. If you do not wish to become pregnant, use a form of birth control. If you plan to become pregnant, see your health care provider for a prepregnancy visit. Take aspirin only as told by your health care provider. Make sure that you understand how much to take and what form to take. Work with your health care provider to find out whether it is safe and beneficial for you to take aspirin daily. Find healthy ways to manage stress, such as: Meditation, yoga, or listening to music. Journaling. Talking to a trusted person. Spending time with friends and family. Minimize exposure to UV radiation to reduce your risk of skin cancer. Safety Always wear your seat belt while driving or riding in a vehicle. Do not drive: If you have been drinking alcohol. Do not ride with someone who has been drinking. When you are tired or distracted. While texting. If you have been using any mind-altering substances or drugs. Wear a helmet and other protective equipment during sports activities. If you have firearms in your house, make sure you follow all gun safety procedures. Seek help if you have been physically or sexually abused. What's next? Visit your health care provider once a year for an annual wellness visit. Ask your health care provider how often you should have your eyes and teeth checked. Stay up to date on all vaccines. This information is not intended to replace advice given to you by your health care provider. Make sure you discuss any questions you have with your health care provider. Document Revised: 05/28/2021 Document Reviewed: 05/28/2021 Elsevier Patient Education  [place].

## 2023-01-08 NOTE — Addendum Note (Signed)
Addended by: Jeronimo Greaves on: 01/08/2023 09:12 AM   Modules accepted: Orders

## 2023-01-11 LAB — HEMOGLOBIN A1C: Hgb A1c MFr Bld: 5.4 % (ref 4.6–6.5)

## 2023-01-18 ENCOUNTER — Encounter: Payer: Self-pay | Admitting: Medical

## 2023-01-19 ENCOUNTER — Encounter: Payer: Self-pay | Admitting: Medical

## 2023-01-20 MED ORDER — SEMAGLUTIDE-WEIGHT MANAGEMENT 0.25 MG/0.5ML ~~LOC~~ SOAJ: 0.2500 mg | SUBCUTANEOUS | 0 refills | Status: DC

## 2023-01-20 MED ORDER — SEMAGLUTIDE-WEIGHT MANAGEMENT 1 MG/0.5ML ~~LOC~~ SOAJ: 1.0000 mg | SUBCUTANEOUS | 0 refills | Status: DC

## 2023-01-20 MED ORDER — SEMAGLUTIDE-WEIGHT MANAGEMENT 1.7 MG/0.75ML ~~LOC~~ SOAJ: 1.7000 mg | SUBCUTANEOUS | 0 refills | Status: DC

## 2023-01-20 MED ORDER — SEMAGLUTIDE-WEIGHT MANAGEMENT 2.4 MG/0.75ML ~~LOC~~ SOAJ: 2.4000 mg | SUBCUTANEOUS | 0 refills | Status: DC

## 2023-01-20 MED ORDER — SEMAGLUTIDE-WEIGHT MANAGEMENT 0.5 MG/0.5ML ~~LOC~~ SOAJ: 0.5000 mg | SUBCUTANEOUS | 0 refills | Status: DC

## 2023-01-20 NOTE — Addendum Note (Signed)
Addended by: Anabel Halon on: 01/20/2023 04:39 PM   Modules accepted: Orders

## 2023-01-20 NOTE — Addendum Note (Signed)
Addended by: Anabel Halon on: 01/20/2023 01:07 PM   Modules accepted: Orders

## 2023-01-20 NOTE — Addendum Note (Signed)
Addended by: Anabel Halon on: 01/20/2023 01:10 PM   Modules accepted: Orders

## 2023-01-22 LAB — HM MAMMOGRAPHY

## 2023-02-02 ENCOUNTER — Ambulatory Visit: Payer: Managed Care, Other (non HMO) | Admitting: Podiatry

## 2023-02-03 ENCOUNTER — Ambulatory Visit (INDEPENDENT_AMBULATORY_CARE_PROVIDER_SITE_OTHER): Payer: Managed Care, Other (non HMO) | Admitting: Podiatry

## 2023-02-03 ENCOUNTER — Encounter: Payer: Self-pay | Admitting: Podiatry

## 2023-02-03 ENCOUNTER — Ambulatory Visit (INDEPENDENT_AMBULATORY_CARE_PROVIDER_SITE_OTHER): Payer: Managed Care, Other (non HMO)

## 2023-02-03 DIAGNOSIS — M2041 Other hammer toe(s) (acquired), right foot: Secondary | ICD-10-CM

## 2023-02-03 DIAGNOSIS — M722 Plantar fascial fibromatosis: Secondary | ICD-10-CM | POA: Diagnosis not present

## 2023-02-03 DIAGNOSIS — M7751 Other enthesopathy of right foot: Secondary | ICD-10-CM | POA: Diagnosis not present

## 2023-02-03 MED ORDER — TRIAMCINOLONE ACETONIDE 10 MG/ML IJ SUSP
10.0000 mg | Freq: Once | INTRAMUSCULAR | Status: AC
  Administered 2023-02-03: 10 mg

## 2023-02-03 NOTE — Progress Notes (Signed)
Subjective:   Patient ID: Lindsey Macias, female   DOB: 47 y.o.   MRN: [id number]   HPI Patient presents after having bunion surgery 15 years ago and tailor's bunion surgery 10 years ago and states that she has inflammation around the fifth MPJ joint right and has a problem between the fourth and fifth digits right that occur periodically and also underneath the second digit right foot.  States that around the MPJ bothers her the most but all 3 problems are bothersome.  Patient does not smoke likes to be active if possible   Review of Systems  All other systems reviewed and are negative.       Objective:  Physical Exam Vitals and nursing note reviewed.  Constitutional:      Appearance: She is well-developed.  Pulmonary:     Effort: Pulmonary effort is normal.  Musculoskeletal:        General: Normal range of motion.  Skin:    General: Skin is warm.  Neurological:     Mental Status: She is alert.     Neurovascular status found to be intact muscle strength was found to be adequate range of motion adequate with patient found to have a small keratotic lesion fourth digit right what appears to be inflammation of the fifth MPJ right history of surgery right with 2 screws but does not appear the pain is associated with the screws and a keratotic lesion subsecond digit right under the proximal phalanx.  Patient was noted to have good digital perfusion and is well-oriented     Assessment:  Inflammatory capsulitis right possibility for interdigital callus formation right with also plantar keratotic lesion fifth digit right painful     Plan:  H&P all conditions reviewed x-rays reviewed today I went ahead did sterile prep injected around the fifth MPJ 3 mg Kenalog 5 mg Xylocaine discussed possible plantar incision second digit right with smoothing of bone spur formation and possible surgery on the fourth toe.  Patient will reappoint 6 weeks we will see the results and decide what might be  appropriate depending on the response to conservative treatment  X-rays indicate that there is screws at the first metatarsal and fifth metatarsal right foot with good alignment noted no other indication of pathology currently

## 2023-03-17 ENCOUNTER — Ambulatory Visit (INDEPENDENT_AMBULATORY_CARE_PROVIDER_SITE_OTHER): Payer: Managed Care, Other (non HMO) | Admitting: Podiatry

## 2023-03-17 ENCOUNTER — Encounter: Payer: Self-pay | Admitting: Podiatry

## 2023-03-17 DIAGNOSIS — D3610 Benign neoplasm of peripheral nerves and autonomic nervous system, unspecified: Secondary | ICD-10-CM

## 2023-03-17 DIAGNOSIS — M7751 Other enthesopathy of right foot: Secondary | ICD-10-CM | POA: Diagnosis not present

## 2023-03-17 DIAGNOSIS — M2041 Other hammer toe(s) (acquired), right foot: Secondary | ICD-10-CM

## 2023-03-17 MED ORDER — TRIAMCINOLONE ACETONIDE 10 MG/ML IJ SUSP
10.0000 mg | Freq: Once | INTRAMUSCULAR | Status: AC
  Administered 2023-03-17: 10 mg

## 2023-03-17 NOTE — Progress Notes (Signed)
Subjective:   Patient ID: Lindsey Macias, female   DOB: 47 y.o.   MRN: [id number]   HPI Patient states she did get some relief for a short period of time in the outside of the foot after my treatment of 2 weeks ago but states that she did get reoccurrence she was not able to wear the padding on the second toe.  She now is complaining that it seems like after I done the injection on the outside that it seems to come more from the third interspace and is more like pressure than shooting but bothersome in this area when I did palpate that area I was able to feel a click positive Mulder sign   ROS      Objective:  Physical Exam upon evaluation I did note a palpable click which seems to elicit symptoms third interspace right foot with some radiating leg pain and continues to have Keratotic lesion subsecond digit right and also has a rotated fifth digit that seems to press against the fourth toe and bother her     Assessment:  Possibility for neuroma symptomatology right with digital deformity second and fifth digit     Plan:  H&P reviewed at great length we did discuss different treatment options and it is possible that 1 point surgical intervention will be necessary.  At this point I went ahead and I did focus on the third interspace and I did do a sterile prep and I injected the nerve root with a combination of Marcaine and Xylocaine and also Kenalog.  I want to see the results over the next couple weeks and we will try to make a more definitive decision as to what might be of benefit

## 2023-03-25 ENCOUNTER — Encounter: Payer: Self-pay | Admitting: Medical

## 2023-03-25 ENCOUNTER — Ambulatory Visit: Payer: Managed Care, Other (non HMO) | Admitting: Medical

## 2023-03-25 VITALS — BP 130/80 | HR 83 | Temp 98.2°F | Resp 18 | Ht 63.0 in | Wt 190.8 lb

## 2023-03-25 DIAGNOSIS — J0111 Acute recurrent frontal sinusitis: Secondary | ICD-10-CM

## 2023-03-25 DIAGNOSIS — J309 Allergic rhinitis, unspecified: Secondary | ICD-10-CM

## 2023-03-25 MED ORDER — AZELASTINE HCL 137 MCG/SPRAY NA SOLN: NASAL | 2 refills | Status: DC

## 2023-03-25 MED ORDER — AZELASTINE HCL 0.05 % OP SOLN: 1.0000 [drp] | Freq: Two times a day (BID) | OPHTHALMIC | 11 refills | Status: DC

## 2023-03-25 MED ORDER — MOMETASONE FUROATE 50 MCG/ACT NA SUSP: NASAL | 11 refills | Status: DC

## 2023-03-25 MED ORDER — AMOXICILLIN-POT CLAVULANATE 875-125 MG PO TABS: 1.0000 | ORAL_TABLET | Freq: Two times a day (BID) | ORAL | 0 refills | Status: DC

## 2023-03-25 NOTE — Progress Notes (Signed)
Subjective:    Patient ID: Lindsey Macias, female    DOB: [birth year], 47 y.o.   MRN: [id number]  HPI  Pt in for recent nasal congestion, runny nose and pnd. She states when had allergy test done her main issue was dust. Recent flare of her allergies.  Has symptoms now for 2 weeks.   Rt ear pain for one week with severe pain yesterday.   Pt is on nasonex, astelin, optivar and zyrtec. Symptoms despite this regimen   Review of Systems  Constitutional:  Negative for chills, fatigue and fever.  HENT:  Positive for congestion, ear pain and postnasal drip. Negative for drooling and facial swelling.        Runny nose.  Respiratory:  Negative for cough, chest tightness and wheezing.   Cardiovascular:  Negative for chest pain and palpitations.  Gastrointestinal:  Negative for abdominal pain and anal bleeding.  Genitourinary:  Negative for dysuria.  Musculoskeletal:  Negative for back pain.  Skin:  Negative for rash.  Neurological:  Negative for dizziness, syncope, weakness and headaches.  Hematological:  Negative for adenopathy. Does not bruise/bleed easily.  Psychiatric/Behavioral:  Negative for behavioral problems and confusion.     Past Medical History:  Diagnosis Date   Allergy    Cancer 2003   cervical cancer 2002 and 2003. had leep procedure.   GERD (gastroesophageal reflux disease)    but actually had h pylori and took meds then got better.   Varicose veins of both lower extremities      Social History   Socioeconomic History   Marital status: Divorced    Spouse name: Not on file   Number of children: Not on file   Years of education: Not on file   Highest education level: Not on file  Occupational History   Not on file  Tobacco Use   Smoking status: Former    Packs/day: 0    Types: Cigarettes    Start date: 11/23/1994    Quit date: 07/28/2017    Years since quitting: 5.6   Smokeless tobacco: Never  Vaping Use   Vaping Use: Never used  Substance and Sexual  Activity   Alcohol use: Yes    Comment: Pt states she drinks 2-3 drinks per year.   Drug use: No   Sexual activity: Not Currently    Comment: intercourse age 31, more than 5 sexual partners  Other Topics Concern   Not on file  Social History Narrative   Not on file   Social Determinants of Health   Financial Resource Strain: Not on file  Food Insecurity: Not on file  Transportation Needs: Not on file  Physical Activity: Not on file  Stress: Not on file  Social Connections: Not on file  Intimate Partner Violence: Not on file    Past Surgical History:  Procedure Laterality Date   Bunions surgery Right 2010 and 2012   ENDOVENOUS ABLATION SAPHENOUS VEIN W/ LASER Right 08/02/2019   endovenous laser ablation right greater saphenous vein by Fabienne Bruns MD    SHOULDER SURGERY Right 2012   TONSILLECTOMY Bilateral 2012    Family History  Problem Relation Age of Onset   Cancer Father        Brain   Diabetes Maternal Grandmother     Allergies  Allergen Reactions   Vicodin [Hydrocodone-Acetaminophen] Photosensitivity   Dust Mite Extract     Sneezing,Watery eyes,Runny nose   Percocet [Oxycodone-Acetaminophen]    Sulfa Antibiotics     Upset  stomach    Current Outpatient Medications on File Prior to Visit  Medication Sig Dispense Refill   amoxicillin-clavulanate (AUGMENTIN) 875-125 MG tablet Take 1 tablet by mouth 2 (two) times daily. 20 tablet 0   aspirin EC 81 MG EC tablet Take 1 tablet (81 mg total) by mouth daily.     benzonatate (TESSALON) 100 MG capsule Take 1 capsule (100 mg total) by mouth 3 (three) times daily as needed for cough. 30 capsule 0   doxycycline (VIBRA-TABS) 100 MG tablet Take 1 tablet (100 mg total) by mouth 2 (two) times daily. 20 tablet 0   eletriptan (RELPAX) 20 MG tablet Take 1 tablet (20 mg total) by mouth as needed for migraine or headache. May repeat in 2 hours if headache persists or recurs. 10 tablet 0   esomeprazole (NEXIUM) 40 MG capsule TAKE  ONE CAPSULE BY MOUTH DAILY 90 capsule 3   fluconazole (DIFLUCAN) 150 MG tablet 1 tab day 2 and then repeat on day 5 if needed. 2 tablet 0   levonorgestrel (MIRENA) 20 MCG/24HR IUD 1 each by Intrauterine route once.     methylPREDNISolone (MEDROL) 4 MG tablet Standard 6 day taper 21 tablet 0   No current facility-administered medications on file prior to visit.    BP 130/80   Pulse 83   Temp 98.2 F (36.8 C)   Resp 18   Ht 5\' 3"  (1.6 m)   Wt 190 lb 12.8 oz (86.5 kg)   SpO2 98%   BMI 33.80 kg/m          Objective:   Physical Exam   General Mental Status- Alert. General Appearance- Not in acute distress.   Skin General: Color- Normal Color. Moisture- Normal Moisture.  Neck Carotid Arteries- Normal color. Moisture- Normal Moisture. No carotid bruits. No JVD.  Chest and Lung Exam Auscultation: Breath Sounds:-Normal.  Cardiovascular Auscultation:Rythm- Regular. Murmurs & Other Heart Sounds:Auscultation of the heart reveals- No Murmurs.  Abdomen Inspection:-Inspeection Normal. Palpation/Percussion:Note:No mass. Palpation and Percussion of the abdomen reveal- Non Tender, Non Distended + BS, no rebound or guarding.  Neurologic Cranial Nerve exam:- CN III-XII intact(No nystagmus), symmetric smile. Strength:- 5/5 equal and symmetric strength both upper and lower extremities.   Heent- nasal congestion, boggy turbinates. +pnd.frontal and ethmoid sinus pressure. Rt ear canal clear and tm normal. Left tm canal clear and tm normal.       Assessment & Plan:   Patient Instructions   Allergic rhinitis with allergic conjunctivitis with frontal sinusitis and associated eustachian tube dysfunction.(Allergy symptoms despite 4 med regimen)  Refilled nasonex, astelin and optivar. Continue zyrtec. Add on medrol dose pack.  Rx augmentin antibiotic.  Follow up 10 days or sooner if needed.      Esperanza Richters PA-C

## 2023-03-25 NOTE — Patient Instructions (Addendum)
Allergic rhinitis with allergic conjunctivitis with frontal sinusitis and associated eustachian tube dysfunction.(Allergy symptoms despite 4 med regimen)  Refilled nasonex, astelin and optivar. Continue zyrtec. Add on medrol dose pack.  Rx augmentin antibiotic.  Follow up 10 days or sooner if needed.

## 2023-03-25 NOTE — Addendum Note (Signed)
Addended by: Gwenevere Abbot on: 03/25/2023 10:07 AM   Modules accepted: Orders

## 2023-03-31 ENCOUNTER — Ambulatory Visit: Payer: Managed Care, Other (non HMO) | Admitting: Podiatry

## 2023-10-20 ENCOUNTER — Encounter: Payer: Self-pay | Admitting: Medical

## 2023-10-20 ENCOUNTER — Ambulatory Visit: Payer: BC Managed Care – PPO | Admitting: Medical

## 2023-10-20 VITALS — BP 122/73 | HR 83 | Resp 18 | Ht 63.0 in | Wt 194.0 lb

## 2023-10-20 DIAGNOSIS — Z6834 Body mass index (BMI) 34.0-34.9, adult: Secondary | ICD-10-CM | POA: Diagnosis not present

## 2023-10-20 DIAGNOSIS — E669 Obesity, unspecified: Secondary | ICD-10-CM | POA: Diagnosis not present

## 2023-10-20 MED ORDER — SEMAGLUTIDE-WEIGHT MANAGEMENT 0.25 MG/0.5ML ~~LOC~~ SOAJ: 0.2500 mg | SUBCUTANEOUS | 0 refills | Status: DC

## 2023-10-20 NOTE — Progress Notes (Signed)
   Subjective:    Patient ID: Lindsey Macias, female    DOB: 1976/08/17, 47 y.o.   MRN: [id number]  HPI  Discussed the use of AI scribe software for clinical note transcription with the patient, who gave verbal consent to proceed.  History of Present Illness   The patient, with a history of obesity and a BMI of 34, has been struggling with weight loss despite regular gym attendance for the past three months, during which she has only lost three to four pounds. She has been considering Wegovy as a weight loss option, and her current [employer] covers it. She denies any personal history of pancreatitis or FH thyroid cancer, and she does not consume a significant amount of alcohol. She has been informed about the potential side effects of Wegovy, including nausea and gastroparesis, and is aware of the need to maintain a diet rich in lean proteins to prevent muscle loss while on the medication.       Review of Systems See hpi.    Objective:   Physical Exam  General- No acute distress. Pleasant patient. Neck- Full range of motion, no jvd Lungs- Clear, even and unlabored. Heart- regular rate and rhythm. Neurologic- CNII- XII grossly intact.       Assessment & Plan:   Assessment and Plan    Obesity BMI 34. Discussed the use of Wegovy for weight loss. No contraindications identified. Discussed potential side effects including nausea, slowing digestion, and pancreatitis. Emphasized the importance of consistent dosing and starting at a low dose. -Start Wegovy at the lowest dose. -Encouraged to continue exercise and lean protein intake to maintain muscle tone during weight loss. -Check weight daily in the morning for accurate tracking. -Follow-up in 1 month by my chart to assess response and consider dose adjustment. -Schedule in-person visit in 3 months to assess overall progress.       Esperanza Richters, Lindsey Macias

## 2023-10-20 NOTE — Patient Instructions (Signed)
Obesity BMI 34. Discussed the use of Wegovy for weight loss. No contraindications identified. Discussed potential side effects including nausea, slowing digestion, and pancreatitis. Emphasized the importance of consistent dosing and starting at a low dose. -Start Wegovy at the lowest dose. -Encouraged to continue exercise and lean protein intake to maintain muscle tone during weight loss. -Check weight daily in the morning for accurate tracking. -Follow-up in 1 month by my chart to assess response and consider dose adjustment. -Schedule in-person visit in 3 months to assess overall progress.

## 2023-10-27 ENCOUNTER — Telehealth: Payer: Self-pay

## 2023-10-27 NOTE — Telephone Encounter (Signed)
PA initiated via Covermymeds; KEY: [id number].   PA approved.   PA Case: [id number], Status: Approved, Coverage Starts on: 10/27/2023 12:00:00 AM, Coverage Ends on: 04/14/2024 12:00:00 AM.

## 2023-10-28 ENCOUNTER — Encounter: Payer: Self-pay | Admitting: Medical

## 2023-11-03 ENCOUNTER — Other Ambulatory Visit: Payer: Self-pay | Admitting: Medical Genetics

## 2023-11-03 DIAGNOSIS — Z006 Encounter for examination for normal comparison and control in clinical research program: Secondary | ICD-10-CM

## 2024-01-24 ENCOUNTER — Ambulatory Visit: Payer: BC Managed Care – PPO | Admitting: Medical

## 2024-01-24 VITALS — BP 132/84 | HR 88 | Temp 98.2°F | Resp 18 | Ht 63.0 in | Wt 188.0 lb

## 2024-01-24 DIAGNOSIS — J309 Allergic rhinitis, unspecified: Secondary | ICD-10-CM

## 2024-01-24 DIAGNOSIS — J0100 Acute maxillary sinusitis, unspecified: Secondary | ICD-10-CM

## 2024-01-24 MED ORDER — FLUCONAZOLE 150 MG PO TABS
ORAL_TABLET | ORAL | 0 refills | Status: DC
Start: 2024-01-24 — End: 2024-02-09

## 2024-01-24 MED ORDER — AMOXICILLIN-POT CLAVULANATE 875-125 MG PO TABS: 1.0000 | ORAL_TABLET | Freq: Two times a day (BID) | ORAL | 0 refills | Status: DC

## 2024-01-24 MED ORDER — METHYLPREDNISOLONE 4 MG PO TABS: ORAL_TABLET | ORAL | 0 refills | Status: DC

## 2024-01-24 MED ORDER — BENZONATATE 100 MG PO CAPS: 100.0000 mg | ORAL_CAPSULE | Freq: Three times a day (TID) | ORAL | 0 refills | Status: DC | PRN

## 2024-01-24 MED ORDER — CEFTRIAXONE SODIUM 1 G IJ SOLR
1.0000 g | Freq: Once | INTRAMUSCULAR | Status: AC
  Administered 2024-01-24: 1 g via INTRAMUSCULAR

## 2024-01-24 NOTE — Progress Notes (Signed)
 Subjective:    Patient ID: Lindsey Macias, female    DOB: 09/17/1976, 48 y.o.   MRN: [id number]  HPI  Lindsey HARTH "Dezare Labella" is a 48 year old female with recurrent sinus infections who presents with sinus pressure and pain.  She began experiencing sinus issues early last week, initially presenting as nasal congestion without other symptoms. By Thursday, sinus pressure developed, leading her to initiate self-care measures including nasal rinses, hot compresses, steam inhalation, and use of a humidifier. Despite these efforts, symptoms progressed, and she woke up yesterday with significant sinus pressure and pain, particularly in the maxillary and frontal sinus areas, as well as tightness in her jaw and pain extending to the back of her neck and scalp. No fever, chills, sweats, or cough. No ear pain, but facial pain is intense.  She has a history of recurrent sinus infections occurring a couple of times a year. She is concerned about the potential progression to bronchitis or an upper respiratory infection, as has happened in the past per her report.  Her current medication regimen includes Tylenol  for sinus headaches, two nasal sprays, eye drops, and Zyrtec at night. She is also on Nasonex  and Astelin  for her year-round allergies, which are primarily due to house dust and dust mites. She has previously been treated with antibiotics and steroids for similar episodes, and recalls that receiving an antibiotic injection in conjunction with oral medications was effective in the past when symptoms were this severe.     Review of Systems  Constitutional:  Negative for fatigue.  HENT:  Positive for congestion, sinus pressure and sinus pain.   Eyes:  Negative for photophobia and pain.  Respiratory:  Negative for cough, choking and wheezing.   Cardiovascular:  Negative for chest pain and palpitations.    Past Medical History:  Diagnosis Date   Allergy    Cancer (HCC) 2003   cervical cancer  2002 and 2003. had leep procedure.   GERD (gastroesophageal reflux disease)    but actually had h pylori and took meds then got better.   Varicose veins of both lower extremities      Social History   Socioeconomic History   Marital status: Divorced    Spouse name: Not on file   Number of children: Not on file   Years of education: Not on file   Highest education level: Not on file  Occupational History   Not on file  Tobacco Use   Smoking status: Former    Current packs/day: 0.00    Types: Cigarettes    Quit date: 11/23/1994    Years since quitting: 29.1   Smokeless tobacco: Never  Vaping Use   Vaping status: Never Used  Substance and Sexual Activity   Alcohol use: Yes    Comment: Pt states she drinks 2-3 drinks per year.   Drug use: No   Sexual activity: Not Currently    Comment: intercourse age 15, more than 5 sexual partners  Other Topics Concern   Not on file  Social History Narrative   Not on file   Social Drivers of Health   Financial Resource Strain: Not on file  Food Insecurity: Not on file  Transportation Needs: Not on file  Physical Activity: Not on file  Stress: Not on file  Social Connections: Not on file  Intimate Partner Violence: Not on file    Past Surgical History:  Procedure Laterality Date   Bunions surgery Right 2010 and 2012  ENDOVENOUS ABLATION SAPHENOUS VEIN W/ LASER Right 08/02/2019   endovenous laser ablation right greater saphenous vein by Stacia Dynes MD    SHOULDER SURGERY Right 2012   TONSILLECTOMY Bilateral 2012    Family History  Problem Relation Age of Onset   Cancer Father        Brain   Diabetes Maternal Grandmother     Allergies  Allergen Reactions   Vicodin [Hydrocodone-Acetaminophen ] Photosensitivity   Dust Mite Extract     Sneezing,Watery eyes,Runny nose   Percocet [Oxycodone -Acetaminophen ]    Sulfa Antibiotics     Upset stomach    Current Outpatient Medications on File Prior to Visit  Medication Sig  Dispense Refill   aspirin  EC 81 MG EC tablet Take 1 tablet (81 mg total) by mouth daily.     azelastine  (OPTIVAR ) 0.05 % ophthalmic solution Place 1 drop into both eyes 2 (two) times daily. 6 mL 11   Azelastine  HCl 137 MCG/SPRAY SOLN SPRAY TWO SPRAYS IN EACH NOSTRIL TWICE DAILY 90 mL 2   eletriptan  (RELPAX ) 20 MG tablet Take 1 tablet (20 mg total) by mouth as needed for migraine or headache. May repeat in 2 hours if headache persists or recurs. 10 tablet 0   esomeprazole  (NEXIUM ) 40 MG capsule TAKE ONE CAPSULE BY MOUTH DAILY 90 capsule 3   levonorgestrel  (MIRENA ) 20 MCG/24HR IUD 1 each by Intrauterine route once.     mometasone  (NASONEX ) 50 MCG/ACT nasal spray 2 sprays twice daily 1 each 11   Semaglutide -Weight Management 0.25 MG/0.5ML SOAJ Inject 0.25 mg into the skin once a week. 2 mL 0   No current facility-administered medications on file prior to visit.    BP 132/84   Pulse 88   Temp 98.2 F (36.8 C)   Resp 18   Ht 5\' 3"  (1.6 m)   Wt 188 lb (85.3 kg)   SpO2 100%   BMI 33.30 kg/m        Objective:   Physical Exam  General Mental Status- Alert. General Appearance- Not in acute distress.   Skin General: Color- Normal Color. Moisture- Normal Moisture.  Neck Carotid Arteries- Normal color. Moisture- Normal Moisture. No carotid bruits. No JVD.  Chest and Lung Exam Auscultation: Breath Sounds:-Normal.  Cardiovascular Auscultation:Rythm- Regular. Murmurs & Other Heart Sounds:Auscultation of the heart reveals- No Murmurs.  Abdomen Inspection:-Inspeection Normal. Palpation/Percussion:Note:No mass. Palpation and Percussion of the abdomen reveal- Non Tender, Non Distended + BS, no rebound or guarding.   Neurologic Cranial Nerve exam:- CN III-XII intact(No nystagmus), symmetric smile. Strength:- 5/5 equal and symmetric strength both upper and lower extremities.   Heent- frontal and maxillary sinus pressure. Maxillary sinus more tender than frontal. Canals clear  bilaterally and tm normal. +pnd.      Assessment & Plan:   Patient Instructions  Sinusitis Severe sinus pressure and pain in the maxillary and frontal areas, with a history of recurrent sinus infections. No fever, chills, or sweats reported. Currently on Astelin  and Nasonex  for year-round dust mite allergy. -Administer Rocephin  1g injection today. -Prescribe Augmentin  875mg  twice daily for 10 days. -Prescribe Medrol  dose pack over 6 days. -Prescribe Benzonatate  cough tablets as needed. -Prescribe Diflucan  to prevent yeast infection due to antibiotic use. -Follow-up in 7-10 days or sooner if needed.  Allergic Rhinitis Year-round symptoms due to dust mite allergy, currently managed with Astelin  and Nasonex . -Continue Astelin  and Nasonex  as prescribed.  Follow up 7-10 days or sooner if needed.   Kamyia Thomason, PA-C

## 2024-01-24 NOTE — Patient Instructions (Signed)
 Sinusitis Severe sinus pressure and pain in the maxillary and frontal areas, with a history of recurrent sinus infections. No fever, chills, or sweats reported. Currently on Astelin  and Nasonex  for year-round dust mite allergy. -Administer Rocephin  1g injection today. -Prescribe Augmentin  875mg  twice daily for 10 days. -Prescribe Medrol  dose pack over 6 days. -Prescribe Benzonatate  cough tablets as needed. -Prescribe Diflucan  to prevent yeast infection due to antibiotic use. -Follow-up in 7-10 days or sooner if needed.  Allergic Rhinitis Year-round symptoms due to dust mite allergy, currently managed with Astelin  and Nasonex . -Continue Astelin  and Nasonex  as prescribed.  Follow up 7-10 days or sooner if needed.

## 2024-01-31 ENCOUNTER — Encounter: Payer: Self-pay | Admitting: Medical

## 2024-01-31 ENCOUNTER — Other Ambulatory Visit: Payer: Self-pay | Admitting: Medical

## 2024-01-31 MED ORDER — SEMAGLUTIDE-WEIGHT MANAGEMENT 0.25 MG/0.5ML ~~LOC~~ SOAJ: 0.2500 mg | SUBCUTANEOUS | 0 refills | Status: DC

## 2024-01-31 MED ORDER — ELETRIPTAN HYDROBROMIDE 20 MG PO TABS: 20.0000 mg | ORAL_TABLET | ORAL | 3 refills | Status: DC | PRN

## 2024-01-31 MED ORDER — AZELASTINE HCL 137 MCG/SPRAY NA SOLN: NASAL | 2 refills | Status: DC

## 2024-01-31 MED ORDER — AZELASTINE HCL 0.05 % OP SOLN: 1.0000 [drp] | Freq: Two times a day (BID) | OPHTHALMIC | 11 refills | Status: DC

## 2024-01-31 NOTE — Addendum Note (Signed)
 Addended by: Gwenevere Abbot on: 01/31/2024 08:26 PM   Modules accepted: Orders

## 2024-01-31 NOTE — Addendum Note (Signed)
 Addended by: Gwenevere Abbot on: 01/31/2024 08:23 PM   Modules accepted: Orders

## 2024-01-31 NOTE — Telephone Encounter (Signed)
 Copied from CRM [id number]. Topic: Clinical - Medication Refill >> Jan 31, 2024  1:30 PM Gurney Maxin H wrote: Most Recent Primary Care Visit:  Provider: Esperanza Richters  Department: [facility]  Visit Type: ACUTE  Date: 01/24/2024  Medication: Semaglutide-Weight Management 0.25 MG/0.5ML SOAJ  Has the patient contacted their pharmacy? Yes, New pharmacy needs new prescriptions (Agent: If no, request that the patient contact the pharmacy for the refill. If patient does not wish to contact the pharmacy document the reason why and proceed with request.) (Agent: If yes, when and what did the pharmacy advise?)  Is this the correct pharmacy for this prescription? Yes If no, delete pharmacy and type the correct one.  This is the patient's preferred pharmacy:  [facility] - [place], [place] - [address] [place] [address] Phone: [phone] Fax: [phone]   Has the prescription been filled recently? No  Is the patient out of the medication? Yes  Has the patient been seen for an appointment in the last year OR does the patient have an upcoming appointment? Yes  Can we respond through MyChart? Yes  Agent: Please be advised that Rx refills may take up to 3 business days. We ask that you follow-up with your pharmacy.

## 2024-02-03 ENCOUNTER — Telehealth: Payer: Self-pay | Admitting: Medical

## 2024-02-03 NOTE — Telephone Encounter (Signed)
 Patient is needing for someone at the office to call [facility] regarding her medication they are still missing things they need in order to fill patient prescription [facility] [phone]

## 2024-02-04 DIAGNOSIS — Z1231 Encounter for screening mammogram for malignant neoplasm of breast: Secondary | ICD-10-CM | POA: Diagnosis not present

## 2024-02-04 LAB — HM MAMMOGRAPHY

## 2024-02-07 ENCOUNTER — Telehealth: Payer: Self-pay | Admitting: Neurology

## 2024-02-07 ENCOUNTER — Encounter: Payer: Self-pay | Admitting: Medical

## 2024-02-07 NOTE — Telephone Encounter (Signed)
 Copied from CRM [id number]. Topic: Clinical - Prescription Issue >> Feb 07, 2024  2:27 PM Corin V wrote: Reason for CRM: Patient is calling in regarding her script for her Wevogy prescription to the [facility] pharmacy. The pharmacist has asked that Esperanza Richters, PA-C call the prescription in directly due to issues with how the script is being written. Pleas call [phone] and a coordinator will verify him and then send him directly to pharmacy to talk to them directly.  The script is currently being written as writing it as 1 pen, not 1 pack of 4 pens. Since she needs to be titered up in dosage, it needs to be written as a 3 month supply of 1 pack at .25MG , 1 pack at .5MG , etc where each pack has 4 pens to last the month. [facility] stated most scripts are for entire tier to work patient up through dosages over the months.

## 2024-02-08 MED ORDER — SEMAGLUTIDE-WEIGHT MANAGEMENT 0.25 MG/0.5ML ~~LOC~~ SOAJ: 0.2500 mg | SUBCUTANEOUS | 0 refills | Status: DC

## 2024-02-08 NOTE — Telephone Encounter (Signed)
 [facility] only does 90 days.  Called pt and asked what local pharmacy she uses and she stated [facility] [address].  Rx has been sent in.

## 2024-02-08 NOTE — Addendum Note (Signed)
 Addended by: Thelma Barge D on: 02/08/2024 09:11 AM   Modules accepted: Orders

## 2024-02-09 ENCOUNTER — Ambulatory Visit (INDEPENDENT_AMBULATORY_CARE_PROVIDER_SITE_OTHER): Payer: BC Managed Care – PPO | Admitting: Medical

## 2024-02-09 VITALS — BP 110/64 | HR 70 | Temp 97.9°F | Resp 18 | Ht 63.0 in | Wt 192.5 lb

## 2024-02-09 DIAGNOSIS — E669 Obesity, unspecified: Secondary | ICD-10-CM

## 2024-02-09 DIAGNOSIS — J309 Allergic rhinitis, unspecified: Secondary | ICD-10-CM

## 2024-02-09 DIAGNOSIS — J0100 Acute maxillary sinusitis, unspecified: Secondary | ICD-10-CM | POA: Diagnosis not present

## 2024-02-09 MED ORDER — CEFTRIAXONE SODIUM 500 MG IJ SOLR
500.0000 mg | Freq: Once | INTRAMUSCULAR | Status: AC
  Administered 2024-02-09: 1000 mg via INTRAMUSCULAR

## 2024-02-09 MED ORDER — MOMETASONE FUROATE 50 MCG/ACT NA SUSP: NASAL | 0 refills | Status: DC

## 2024-02-09 NOTE — Progress Notes (Signed)
 Subjective:    Patient ID: Lindsey Macias, female    DOB: 02/27/1976, 48 y.o.   MRN: [id number]  HPI  SIMRIN VEGH "Kameah Rawl" is a 48 year old female with recurrent sinus infections who presents with sinus congestion and ear pain.  She experiences sinus congestion and ear pain, predominantly on the left side, accompanied by sinus tenderness. A worsening cough began two nights ago, and she feels a sensation of something solid in the back of her throat. Her symptoms had previously improved with antibiotics and steroids but have recurred in the last 24 to 36 hours.  She has a history of sinus infections occurring three to four times annually, sometimes necessitating consecutive treatments. Past treatments include Augmentin 875 mg for ten days, a cough tablet, and steroids. She also uses nasal sprays such as Nasonex and Astelin and requires a refill for mometasone. 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  She is managing her weight with QMVHQI, which she resumed after a lapse since December due to prescription transfer issues. She reports no side effects from [facility] and attributes her weight loss of approximately 12 pounds to her diet, which is high in protein, fruits, and vegetables. She engages in resistance training and participates in support groups and a program through [facility].      Review of Systems  Constitutional:  Negative for chills and fatigue.       Obesity. On wegovy.  HENT:  Positive for congestion, sinus pressure and sinus pain.   Respiratory:  Negative for cough, shortness of breath and wheezing.   Cardiovascular:  Negative for chest pain and palpitations.  Genitourinary:  Negative for dysuria and flank pain.  Neurological:  Negative for syncope, facial asymmetry and numbness.  Hematological:  Negative for adenopathy. Does not bruise/bleed easily.   Psychiatric/Behavioral:  Negative for behavioral problems.     Past Medical History:  Diagnosis Date   Allergy    Cancer (HCC) 2003   cervical cancer 2002 and 2003. had leep procedure.   GERD (gastroesophageal reflux disease)    but actually had h pylori and took meds then got better.   Varicose veins of both lower extremities      Social History   Socioeconomic History   Marital status: Divorced    Spouse name: Not on file   Number of children: Not on file   Years of education: Not on file   Highest education level: Not on file  Occupational History   Not on file  Tobacco Use   Smoking status: Former    Current packs/day: 0.00    Types: Cigarettes    Quit date: 11/23/1994    Years since quitting: 29.2   Smokeless tobacco: Never  Vaping Use   Vaping status: Never Used  Substance and Sexual Activity   Alcohol use: Yes    Comment: Pt states she drinks 2-3 drinks per year.   Drug use: No   Sexual activity: Not Currently    Comment: intercourse age 48, more than 5 sexual partners  Other Topics Concern   Not on file  Social History Narrative   Not on file   Social Drivers of Health   Financial Resource Strain: Not on file  Food Insecurity: Not on file  Transportation Needs: Not on file  Physical Activity: Not on file  Stress: Not on file  Social Connections: Not on file  Intimate Partner Violence: Not on file    Past Surgical History:  Procedure Laterality Date   Bunions surgery Right 2010 and 2012   ENDOVENOUS ABLATION SAPHENOUS VEIN W/ LASER Right 08/02/2019   endovenous laser ablation right greater saphenous vein by Fabienne Bruns MD    SHOULDER SURGERY Right 2012   TONSILLECTOMY Bilateral 2012    Family History  Problem Relation Age of Onset   Cancer Father        Brain   Diabetes Maternal Grandmother     Allergies  Allergen Reactions   Vicodin [Hydrocodone-Acetaminophen] Photosensitivity   Dust Mite Extract     Sneezing,Watery eyes,Runny  nose   Percocet [Oxycodone-Acetaminophen]    Sulfa Antibiotics     Upset stomach    Current Outpatient Medications on File Prior to Visit  Medication Sig Dispense Refill   aspirin EC 81 MG EC tablet Take 1 tablet (81 mg total) by mouth daily.     azelastine (OPTIVAR) 0.05 % ophthalmic solution Place 1 drop into both eyes 2 (two) times daily. 6 mL 11   Azelastine HCl 137 MCG/SPRAY SOLN SPRAY TWO SPRAYS IN EACH NOSTRIL TWICE DAILY 90 mL 2   benzonatate (TESSALON) 100 MG capsule Take 1 capsule (100 mg total) by mouth 3 (three) times daily as needed for cough. 30 capsule 0   eletriptan (RELPAX) 20 MG tablet Take 1 tablet (20 mg total) by mouth as needed for migraine or headache. May repeat in 2 hours if headache persists or recurs. 10 tablet 3   esomeprazole (NEXIUM) 40 MG capsule TAKE ONE CAPSULE BY MOUTH DAILY 90 capsule 3   levonorgestrel (MIRENA) 20 MCG/24HR IUD 1 each by Intrauterine route once.     mometasone (NASONEX) 50 MCG/ACT nasal spray 2 sprays twice daily 1 each 11   Semaglutide-Weight Management 0.25 MG/0.5ML SOAJ Inject 0.25 mg into the skin once a week. 2 mL 0   No current facility-administered medications on file prior to visit.    BP 110/64   Pulse 70   Temp 97.9 F (36.6 C)   Resp 18   Ht 5\' 3"  (1.6 m)   Wt 192 lb 8 oz (87.3 kg)   SpO2 100%   BMI 34.10 kg/m        Objective:   Physical Exam  General Mental Status- Alert. General Appearance- Not in acute distress.   Skin General: Color- Normal Color. Moisture- Normal Moisture.  Neck Carotid Arteries- Normal color. Moisture- Normal Moisture. No carotid bruits. No JVD.  Chest and Lung Exam Auscultation: Breath Sounds- CTA  Cardiovascular Auscultation:Rythm- RRR Murmurs & Other Heart Sounds:Auscultation of the heart reveals- No Murmurs.  Abdomen Inspection:-Inspeection Normal. Palpation/Percussion:Note:No mass. Palpation and Percussion of the abdomen reveal- Non Tender, Non Distended + BS, no rebound  or guarding.   Neurologic Cranial Nerve exam:- CN III-XII intact(No nystagmus), symmetric smile. Strength:- 5/5 equal and symmetric strength both upper and lower extremities.   Heent- frontal and maxillary sinus pressure. Left cana clear. TM mild red.       Assessment & Plan:   Patient Instructions  Recurrent Sinusitis Acute sinusitis with sinus pressure, congestion, and ear pain. History of recurrent sinusitis, deviated septum, and nasal polyps. Concerns about frequent use of steroids due to potential long-term side effects. -Administer Rocephin 1g injection. -Prescribe Augmentin 875mg  for 10 days. -Prescribe cough tab benzonatate -Continue Nasonex and Astelin nasal sprays. -Consider adding Medrol if symptoms do not improve in a week. -Refer to ENT for further evaluation and management.  Weight Management On QMVHQI for weight loss, recently restarted after a gap due to prescription refill issues. Lost 12 pounds while on Wegovy previously. No side effects reported. -Continue [id number], starting at the lowest dose. -Encourage high protein diet and resistance training as tolerated.  Allergic Rhinitis Managed with Nasonex and Astelin nasal sprays. -Continue Nasonex and Astelin nasal sprays. -Ensure mometasone prescription is refilled.  Follow up date to be determined.     Esperanza Richters, PA-C

## 2024-02-09 NOTE — Addendum Note (Signed)
 Addended by: Maximino Sarin on: 02/09/2024 04:05 PM   Modules accepted: Orders

## 2024-02-09 NOTE — Patient Instructions (Signed)
 Recurrent Sinusitis Acute sinusitis with sinus pressure, congestion, and ear pain. History of recurrent sinusitis, deviated septum, and nasal polyps. Concerns about frequent use of steroids due to potential long-term side effects. -Administer Rocephin 1g injection. -Prescribe Augmentin 875mg  for 10 days. -Prescribe cough tab benzonatate -Continue Nasonex and Astelin nasal sprays. -Consider adding Medrol if symptoms do not improve in a week. -Refer to ENT for further evaluation and management.  Weight Management On ZOXWRU for weight loss, recently restarted after a gap due to prescription refill issues. Lost 12 pounds while on Wegovy previously. No side effects reported. -Continue [id number], starting at the lowest dose. -Encourage high protein diet and resistance training as tolerated.  Allergic Rhinitis Managed with Nasonex and Astelin nasal sprays. -Continue Nasonex and Astelin nasal sprays. -Ensure mometasone prescription is refilled.  Follow up date to be determined.

## 2024-02-10 ENCOUNTER — Encounter: Payer: Self-pay | Admitting: Medical

## 2024-02-11 ENCOUNTER — Encounter: Payer: Self-pay | Admitting: Medical

## 2024-02-11 MED ORDER — FLUCONAZOLE 150 MG PO TABS: 150.0000 mg | ORAL_TABLET | Freq: Every day | ORAL | 1 refills | Status: DC

## 2024-02-11 MED ORDER — AMOXICILLIN-POT CLAVULANATE 875-125 MG PO TABS: 1.0000 | ORAL_TABLET | Freq: Two times a day (BID) | ORAL | 0 refills | Status: DC

## 2024-02-11 MED ORDER — SEMAGLUTIDE-WEIGHT MANAGEMENT 0.25 MG/0.5ML ~~LOC~~ SOAJ: 0.2500 mg | SUBCUTANEOUS | 0 refills | Status: DC

## 2024-02-11 MED ORDER — BENZONATATE 100 MG PO CAPS
100.0000 mg | ORAL_CAPSULE | Freq: Three times a day (TID) | ORAL | 0 refills | Status: DC | PRN
Start: 2024-02-11 — End: 2024-03-03

## 2024-02-11 NOTE — Addendum Note (Signed)
 Addended by: Gwenevere Abbot on: 02/11/2024 08:49 AM   Modules accepted: Orders

## 2024-02-12 MED ORDER — MOMETASONE FUROATE 50 MCG/ACT NA SUSP: NASAL | 0 refills | Status: DC

## 2024-02-12 NOTE — Addendum Note (Signed)
 Addended by: Gwenevere Abbot on: 02/12/2024 10:00 AM   Modules accepted: Orders

## 2024-02-15 MED ORDER — METHYLPREDNISOLONE 4 MG PO TABS: ORAL_TABLET | ORAL | 0 refills | Status: DC

## 2024-02-15 NOTE — Addendum Note (Signed)
 Addended by: Gwenevere Abbot on: 02/15/2024 05:15 PM   Modules accepted: Orders

## 2024-02-24 ENCOUNTER — Other Ambulatory Visit (HOSPITAL_COMMUNITY): Payer: Self-pay

## 2024-02-24 ENCOUNTER — Telehealth: Payer: Self-pay

## 2024-02-24 NOTE — Telephone Encounter (Signed)
 Pharmacy Patient Advocate Encounter   Received notification from CoverMyMeds that prior authorization for Mometasone Furoate 50MCG/ACT suspension.   Insurance verification completed.   The patient is insured through  [facility] .  Action: Medication is now available without a prior authorization. PLAN PAYS FOR 17GM FOR 30 DAYS SUPPLY TEST CLAIM FOR $20.00 [facility]LL[facility]NE DOSE NOT PAY FOR MORE THAN A 30 DAY SUPPLY FOR THE PARTICULAR MEDS PER CSR

## 2024-02-25 ENCOUNTER — Ambulatory Visit: Payer: BC Managed Care – PPO | Admitting: Medical

## 2024-02-25 ENCOUNTER — Other Ambulatory Visit: Payer: Self-pay | Admitting: Medical

## 2024-02-25 VITALS — BP 118/76 | HR 85 | Temp 98.0°F | Resp 18 | Ht 63.0 in | Wt 186.8 lb

## 2024-02-25 DIAGNOSIS — Z0001 Encounter for general adult medical examination with abnormal findings: Secondary | ICD-10-CM | POA: Diagnosis not present

## 2024-02-25 DIAGNOSIS — Z0000 Encounter for general adult medical examination without abnormal findings: Secondary | ICD-10-CM

## 2024-02-25 DIAGNOSIS — R5383 Other fatigue: Secondary | ICD-10-CM | POA: Diagnosis not present

## 2024-02-25 DIAGNOSIS — R252 Cramp and spasm: Secondary | ICD-10-CM

## 2024-02-25 LAB — VITAMIN B12: Vitamin B-12: 236 pg/mL (ref 211–911)

## 2024-02-25 LAB — CBC WITH DIFFERENTIAL/PLATELET
Basophils Absolute: 0 10*3/uL (ref 0.0–0.1)
Basophils Relative: 0.3 % (ref 0.0–3.0)
Eosinophils Absolute: 0.1 10*3/uL (ref 0.0–0.7)
Eosinophils Relative: 1.2 % (ref 0.0–5.0)
HCT: 42.9 % (ref 36.0–46.0)
Hemoglobin: 14.5 g/dL (ref 12.0–15.0)
Lymphocytes Relative: 32.7 % (ref 12.0–46.0)
Lymphs Abs: 2.2 10*3/uL (ref 0.7–4.0)
MCHC: 33.9 g/dL (ref 30.0–36.0)
MCV: 90 fl (ref 78.0–100.0)
Monocytes Absolute: 0.4 10*3/uL (ref 0.1–1.0)
Monocytes Relative: 6.4 % (ref 3.0–12.0)
Neutro Abs: 4.1 10*3/uL (ref 1.4–7.7)
Neutrophils Relative %: 59.4 % (ref 43.0–77.0)
Platelets: 243 10*3/uL (ref 150.0–400.0)
RBC: 4.77 Mil/uL (ref 3.87–5.11)
RDW: 13.8 % (ref 11.5–15.5)
WBC: 6.9 10*3/uL (ref 4.0–10.5)

## 2024-02-25 LAB — COMPREHENSIVE METABOLIC PANEL
ALT: 19 U/L (ref 0–35)
AST: 16 U/L (ref 0–37)
Albumin: 4.3 g/dL (ref 3.5–5.2)
Alkaline Phosphatase: 85 U/L (ref 39–117)
BUN: 13 mg/dL (ref 6–23)
CO2: 24 meq/L (ref 19–32)
Calcium: 9.3 mg/dL (ref 8.4–10.5)
Chloride: 106 meq/L (ref 96–112)
Creatinine, Ser: 1.09 mg/dL (ref 0.40–1.20)
GFR: 59.73 mL/min — ABNORMAL LOW (ref >60.00)
Glucose, Bld: 75 mg/dL (ref 70–99)
Potassium: 4.2 meq/L (ref 3.5–5.1)
Sodium: 141 meq/L (ref 135–145)
Total Bilirubin: 0.4 mg/dL (ref 0.2–1.2)
Total Protein: 6.4 g/dL (ref 6.0–8.3)

## 2024-02-25 LAB — LIPID PANEL
Cholesterol: 206 mg/dL — ABNORMAL HIGH (ref 0–200)
HDL: 37.1 mg/dL — ABNORMAL LOW (ref >39.00)
LDL Cholesterol: 129 mg/dL — ABNORMAL HIGH (ref 0–99)
NonHDL: 168.46
Total CHOL/HDL Ratio: 6
Triglycerides: 195 mg/dL — ABNORMAL HIGH (ref 0.0–149.0)
VLDL: 39 mg/dL (ref 0.0–40.0)

## 2024-02-25 LAB — MAGNESIUM: Magnesium: 2 mg/dL (ref 1.5–2.5)

## 2024-02-25 LAB — T4, FREE: Free T4: 0.84 ng/dL (ref 0.60–1.60)

## 2024-02-25 LAB — TSH: TSH: 3.26 u[IU]/mL (ref 0.35–5.50)

## 2024-02-25 NOTE — Progress Notes (Signed)
 Subjective:    Patient ID: Lindsey Macias, female    DOB: [birth year], 48 y.o.   MRN: [id number]  HPI  Here for wellness exam. Pt is fasting.  Pt works HR/payroll. She is trying to get eat better. Non smoker and rare alcohol. Exercising 3 days a week.    Flu vaccine this year.   Pt getting pap in near future but will do with new gyn in [place] when she move in about a month or so.  Up to date on mammogram.       Review of Systems  Constitutional:  Negative for chills, fatigue and fever.  HENT:  Negative for congestion and ear discharge.   Respiratory:  Negative for cough, chest tightness and wheezing.   Cardiovascular:  Negative for chest pain and palpitations.  Gastrointestinal:  Negative for abdominal pain, blood in stool, constipation, nausea and vomiting.  Musculoskeletal:  Negative for back pain and myalgias.  Skin:  Negative for rash.  Neurological:  Negative for dizziness, seizures, weakness and light-headedness.  Hematological:  Negative for adenopathy. Does not bruise/bleed easily.  Psychiatric/Behavioral:  Negative for behavioral problems, decreased concentration and dysphoric mood.     Past Medical History:  Diagnosis Date   Allergy    Cancer (HCC) 2003   cervical cancer 2002 and 2003. had leep procedure.   GERD (gastroesophageal reflux disease)    but actually had h pylori and took meds then got better.   Varicose veins of both lower extremities      Social History   Socioeconomic History   Marital status: Divorced    Spouse name: Not on file   Number of children: Not on file   Years of education: Not on file   Highest education level: Not on file  Occupational History   Not on file  Tobacco Use   Smoking status: Former    Current packs/day: 0.00    Types: Cigarettes    Quit date: 11/23/1994    Years since quitting: 29.2   Smokeless tobacco: Never  Vaping Use   Vaping status: Never Used  Substance and Sexual Activity   Alcohol use: Yes     Comment: Pt states she drinks 2-3 drinks per year.   Drug use: No   Sexual activity: Not Currently    Comment: intercourse age 48, more than 5 sexual partners  Other Topics Concern   Not on file  Social History Narrative   Not on file   Social Drivers of Health   Financial Resource Strain: Not on file  Food Insecurity: Not on file  Transportation Needs: Not on file  Physical Activity: Not on file  Stress: Not on file  Social Connections: Not on file  Intimate Partner Violence: Not on file    Past Surgical History:  Procedure Laterality Date   Bunions surgery Right 2010 and 2012   ENDOVENOUS ABLATION SAPHENOUS VEIN W/ LASER Right 08/02/2019   endovenous laser ablation right greater saphenous vein by Fabienne Bruns MD    SHOULDER SURGERY Right 2012   TONSILLECTOMY Bilateral 2012    Family History  Problem Relation Age of Onset   Cancer Father        Brain   Diabetes Maternal Grandmother     Allergies  Allergen Reactions   Vicodin [Hydrocodone-Acetaminophen] Photosensitivity   Dust Mite Extract     Sneezing,Watery eyes,Runny nose   Percocet [Oxycodone-Acetaminophen]    Sulfa Antibiotics     Upset stomach    Current Outpatient Medications  on File Prior to Visit  Medication Sig Dispense Refill   amoxicillin-clavulanate (AUGMENTIN) 875-125 MG tablet Take 1 tablet by mouth 2 (two) times daily. 20 tablet 0   aspirin EC 81 MG EC tablet Take 1 tablet (81 mg total) by mouth daily.     azelastine (OPTIVAR) 0.05 % ophthalmic solution Place 1 drop into both eyes 2 (two) times daily. 6 mL 11   Azelastine HCl 137 MCG/SPRAY SOLN SPRAY TWO SPRAYS IN EACH NOSTRIL TWICE DAILY 90 mL 2   benzonatate (TESSALON) 100 MG capsule Take 1 capsule (100 mg total) by mouth 3 (three) times daily as needed for cough. 30 capsule 0   benzonatate (TESSALON) 100 MG capsule Take 1 capsule (100 mg total) by mouth 3 (three) times daily as needed for cough. 30 capsule 0   eletriptan (RELPAX) 20 MG tablet  Take 1 tablet (20 mg total) by mouth as needed for migraine or headache. May repeat in 2 hours if headache persists or recurs. 10 tablet 3   esomeprazole (NEXIUM) 40 MG capsule TAKE ONE CAPSULE BY MOUTH DAILY 90 capsule 3   fluconazole (DIFLUCAN) 150 MG tablet Take 1 tablet (150 mg total) by mouth daily. 1 tablet 1   levonorgestrel (MIRENA) 20 MCG/24HR IUD 1 each by Intrauterine route once.     methylPREDNISolone (MEDROL) 4 MG tablet Standard 6 day taper dose pack 21 tablet 0   mometasone (NASONEX) 50 MCG/ACT nasal spray 2 sprays twice daily 12 each 0   Semaglutide-Weight Management 0.25 MG/0.5ML SOAJ Inject 0.25 mg into the skin once a week. 2 mL 0   Semaglutide-Weight Management 0.25 MG/0.5ML SOAJ Inject 0.25 mg into the skin once a week. 2 mL 0   No current facility-administered medications on file prior to visit.    BP 118/76   Pulse 85   Temp 98 F (36.7 C)   Resp 18   Ht 5\' 3"  (1.6 m)   Wt 186 lb 12.8 oz (84.7 kg)   SpO2 98%   BMI 33.09 kg/m        Objective:   Physical Exam  General Mental Status- Alert. General Appearance- Not in acute distress.   Skin General: Color- Normal Color. Moisture- Normal Moisture.  Neck No JVD.  Chest and Lung Exam Auscultation: Breath Sounds:-Normal.  Cardiovascular Auscultation:Rythm- Regular. Murmurs & Other Heart Sounds:Auscultation of the heart reveals- No Murmurs.  Abdomen Inspection:-Inspeection Normal. Palpation/Percussion:Note:No mass. Palpation and Percussion of the abdomen reveal- Non Tender, Non Distended + BS, no rebound or guarding.   Neurologic Cranial Nerve exam:- CN III-XII intact(No nystagmus), symmetric smile. Strength:- 5/5 equal and symmetric strength both upper and lower extremities.       Assessment & Plan:  For you wellness exam today I have ordered cbc, cmp and lipid panel.  Offered flu vaccine. But declined.  Up to date on mammogram.  You will get pap done in [place] when you  move.  Recommend exercise and healthy diet.  We will let you know lab results as they come in.  Follow up date appointment will be determined after lab review.     Esperanza Richters, PA-C

## 2024-02-25 NOTE — Patient Instructions (Addendum)
 For you wellness exam today I have ordered cbc, cmp and lipid panel.  Offered flu vaccine. But declined.  Up to date on mammogram.  You will get pap done in [place] when you move.  Recommend exercise and healthy diet.  We will let you know lab results as they come in.  Follow up date appointment will be determined after lab review.     Preventive Care 50-50 Years Old, Female Preventive care refers to lifestyle choices and visits with your health care provider that can promote health and wellness. Preventive care visits are also called wellness exams. What can I expect for my preventive care visit? Counseling Your health care provider may ask you questions about your: Medical history, including: Past medical problems. Family medical history. Pregnancy history. Current health, including: Menstrual cycle. Method of birth control. Emotional well-being. Home life and relationship well-being. Sexual activity and sexual health. Lifestyle, including: Alcohol, nicotine or tobacco, and drug use. Access to firearms. Diet, exercise, and sleep habits. Work and work Astronomer. Sunscreen use. Safety issues such as seatbelt and bike helmet use. Physical exam Your health care provider will check your: Height and weight. These may be used to calculate your BMI (body mass index). BMI is a measurement that tells if you are at a healthy weight. Waist circumference. This measures the distance around your waistline. This measurement also tells if you are at a healthy weight and may help predict your risk of certain diseases, such as type 2 diabetes and high blood pressure. Heart rate and blood pressure. Body temperature. Skin for abnormal spots. What immunizations do I need?  Vaccines are usually given at various ages, according to a schedule. Your health care provider will recommend vaccines for you based on your age, medical history, and lifestyle or other factors, such as travel or where  you work. What tests do I need? Screening Your health care provider may recommend screening tests for certain conditions. This may include: Lipid and cholesterol levels. Diabetes screening. This is done by checking your blood sugar (glucose) after you have not eaten for a while (fasting). Pelvic exam and Pap test. Hepatitis B test. Hepatitis C test. HIV (human immunodeficiency virus) test. STI (sexually transmitted infection) testing, if you are at risk. Lung cancer screening. Colorectal cancer screening. Mammogram. Talk with your health care provider about when you should start having regular mammograms. This may depend on whether you have a family history of breast cancer. BRCA-related cancer screening. This may be done if you have a family history of breast, ovarian, tubal, or peritoneal cancers. Bone density scan. This is done to screen for osteoporosis. Talk with your health care provider about your test results, treatment options, and if necessary, the need for more tests. Follow these instructions at home: Eating and drinking  Eat a diet that includes fresh fruits and vegetables, whole grains, lean protein, and low-fat dairy products. Take vitamin and mineral supplements as recommended by your health care provider. Do not drink alcohol if: Your health care provider tells you not to drink. You are pregnant, may be pregnant, or are planning to become pregnant. If you drink alcohol: Limit how much you have to 0-1 drink a day. Know how much alcohol is in your drink. In the [place] one drink equals one 12 oz bottle of beer (355 mL), one 5 oz glass of wine (148 mL), or one 1 oz glass of hard liquor (44 mL). Lifestyle Brush your teeth every morning and night with fluoride toothpaste. Floss  one time each day. Exercise for at least 30 minutes 5 or more days each week. Do not use any products that contain nicotine or tobacco. These products include cigarettes, chewing tobacco, and vaping  devices, such as e-cigarettes. If you need help quitting, ask your health care provider. Do not use drugs. If you are sexually active, practice safe sex. Use a condom or other form of protection to prevent STIs. If you do not wish to become pregnant, use a form of birth control. If you plan to become pregnant, see your health care provider for a prepregnancy visit. Take aspirin only as told by your health care provider. Make sure that you understand how much to take and what form to take. Work with your health care provider to find out whether it is safe and beneficial for you to take aspirin daily. Find healthy ways to manage stress, such as: Meditation, yoga, or listening to music. Journaling. Talking to a trusted person. Spending time with friends and family. Minimize exposure to UV radiation to reduce your risk of skin cancer. Safety Always wear your seat belt while driving or riding in a vehicle. Do not drive: If you have been drinking alcohol. Do not ride with someone who has been drinking. When you are tired or distracted. While texting. If you have been using any mind-altering substances or drugs. Wear a helmet and other protective equipment during sports activities. If you have firearms in your house, make sure you follow all gun safety procedures. Seek help if you have been physically or sexually abused. What's next? Visit your health care provider once a year for an annual wellness visit. Ask your health care provider how often you should have your eyes and teeth checked. Stay up to date on all vaccines. This information is not intended to replace advice given to you by your health care provider. Make sure you discuss any questions you have with your health care provider. Document Revised: 05/28/2021 Document Reviewed: 05/28/2021 Elsevier Patient Education  2024 [employer].

## 2024-02-25 NOTE — Addendum Note (Signed)
 Addended by: Gwenevere Abbot on: 02/25/2024 08:59 AM   Modules accepted: Orders

## 2024-02-26 ENCOUNTER — Encounter: Payer: Self-pay | Admitting: Medical

## 2024-03-01 ENCOUNTER — Telehealth: Payer: Self-pay | Admitting: Emergency Medicine

## 2024-03-01 MED ORDER — WEGOVY 0.5 MG/0.5ML ~~LOC~~ SOAJ: 0.5000 mg | SUBCUTANEOUS | 0 refills | Status: DC

## 2024-03-01 NOTE — Addendum Note (Signed)
 Addended by: Gwenevere Abbot on: 03/01/2024 05:06 PM   Modules accepted: Orders

## 2024-03-01 NOTE — Telephone Encounter (Signed)
 Copied from CRM [id number]. Topic: Clinical - Prescription Issue >> Mar 01, 2024  4:20 PM Isabell A wrote: Reason for CRM: Patient states she is needing her next dose of Wegovy - not a refill but another dosage.  Callback number: [phone]

## 2024-03-03 ENCOUNTER — Ambulatory Visit (INDEPENDENT_AMBULATORY_CARE_PROVIDER_SITE_OTHER): Admitting: Medical

## 2024-03-03 VITALS — BP 116/82 | HR 86 | Temp 98.0°F | Resp 18 | Ht 63.0 in | Wt 189.0 lb

## 2024-03-03 DIAGNOSIS — R944 Abnormal results of kidney function studies: Secondary | ICD-10-CM | POA: Diagnosis not present

## 2024-03-03 DIAGNOSIS — E538 Deficiency of other specified B group vitamins: Secondary | ICD-10-CM | POA: Diagnosis not present

## 2024-03-03 DIAGNOSIS — E785 Hyperlipidemia, unspecified: Secondary | ICD-10-CM

## 2024-03-03 MED ORDER — MOMETASONE FUROATE 50 MCG/ACT NA SUSP: NASAL | 11 refills | Status: AC

## 2024-03-03 MED ORDER — AZELASTINE HCL 137 MCG/SPRAY NA SOLN: NASAL | 2 refills | Status: DC

## 2024-03-03 MED ORDER — AZELASTINE HCL 0.05 % OP SOLN: 1.0000 [drp] | Freq: Two times a day (BID) | OPHTHALMIC | 11 refills | Status: AC

## 2024-03-03 MED ORDER — ELETRIPTAN HYDROBROMIDE 20 MG PO TABS: 20.0000 mg | ORAL_TABLET | ORAL | 3 refills | Status: AC | PRN

## 2024-03-03 MED ORDER — CYANOCOBALAMIN 1000 MCG/ML IJ SOLN
1000.0000 ug | Freq: Once | INTRAMUSCULAR | Status: AC
  Administered 2024-03-03: 1000 ug via INTRAMUSCULAR

## 2024-03-03 NOTE — Addendum Note (Signed)
 Addended by: Maximino Sarin on: 03/03/2024 08:40 AM   Modules accepted: Orders

## 2024-03-03 NOTE — Patient Instructions (Signed)
 Vitamin B12 Deficiency Low B12 level confirmed. Initial weekly injections followed by monthly maintenance planned. If a gap occurs after moving, consider 5000 mcg daily oral B12 until new provider can check levels. - Administer weekly B12 injections for the next three weeks. - Schedule first monthly B12 injection before moving to [place]. - Consider 5000 mcg daily oral B12 if there is a gap in injections after moving.  Hyperlipidemia Mildly elevated cholesterol levels. Cardiovascular risk score is 1.6% over ten years, below the 7.5% threshold for treatment. - Encourage weight loss to help manage cholesterol levels. - Discuss the correlation between processed carbs and triglycerides. - Encourage regular exercise to potentially increase HDL levels.  Low Glomerular Filtration Rate (GFR) GFR slightly below normal, possibly due to dehydration. Normal creatinine levels indicate overall good kidney function. - Ensure adequate hydration before future metabolic panel tests. - Avoid chronic use of NSAIDs like ibuprofen or Aleve.  Weight Management On FAOZHY for weight loss. - Continue Wegovy 0.5 mg weekly for weight loss. - Prescription [facility] to [facility] on [address].  Migraine Experiences migraines, which influence dietary choices. - Continue to avoid known migraine triggers.  Allergic Rhinitis Requires azelastine nasal spray for allergy management. - [facility] prescription [facility] [address].  General Health Maintenance Stopped smoking in September 2018, which is beneficial for overall health. - Encourage continued abstinence from smoking.   Follow up as needed.

## 2024-03-03 NOTE — Progress Notes (Signed)
 Subjective:    Patient ID: Lindsey Macias, female    DOB: 1976-07-03, 48 y.o.   MRN: [id number]  HPI Discussed the use of AI scribe software for clinical note transcription with the patient, who gave verbal consent to proceed.  Lindsey Macias "Lindsey Macias" is a 48 year old female who presents with concerns about abnormal liver function tests and cholesterol levels.  She is concerned about her recent blood work, which showed abnormal liver function tests. Her liver enzymes, specifically ALT and AST, were within normal limits. She has been taking excessive amounts of Tylenol for sinus pain, which she believes may have impacted her liver function. The sinus pain was severe, affecting her daily functioning, and required frequent dosing of Tylenol for management.  Regarding her cholesterol, she maintains a diet rich in fruits and vegetables and low in red meat and eggs, yet her cholesterol levels are slightly elevated. She avoids processed foods and occasionally consumes fresh deli meats. She has a history of migraines, which influence her dietary choices, such as avoiding pizza, a known trigger.  She is also concerned about her kidney function due to a low glomerular filtration rate (GFR), although her creatinine levels are normal. She is aware of the potential impact of dehydration on kidney function and is cautious about the use of NSAIDs due to their potential effects on kidney health.  Family history is significant for lung and brain cancer, with her father and an aunt affected. Her grandmother had diabetes, and an uncle had coronary artery disease. She denies any family history of heart attacks or strokes.  Socially, she quit smoking in September 2018 and drinks a lot of water to stay hydrated.           Review of Systems  Constitutional:  Negative for chills and fatigue.  HENT:  Negative for congestion, ear discharge and ear pain.   Respiratory:  Negative for cough, chest tightness and  wheezing.   Cardiovascular:  Negative for chest pain and palpitations.  Gastrointestinal:  Negative for abdominal pain, blood in stool, diarrhea, nausea and vomiting.  Genitourinary:  Negative for dyspareunia and enuresis.  Musculoskeletal:  Negative for back pain, joint swelling and neck pain.  Skin:  Negative for rash.  Neurological:  Negative for dizziness, seizures, weakness and light-headedness.  Hematological:  Negative for adenopathy. Does not bruise/bleed easily.  Psychiatric/Behavioral:  Negative for behavioral problems and decreased concentration.     Past Medical History:  Diagnosis Date   Allergy    Cancer (HCC) 2003   cervical cancer 2002 and 2003. had leep procedure.   GERD (gastroesophageal reflux disease)    but actually had h pylori and took meds then got better.   Varicose veins of both lower extremities      Social History   Socioeconomic History   Marital status: Divorced    Spouse name: Not on file   Number of children: Not on file   Years of education: Not on file   Highest education level: Not on file  Occupational History   Not on file  Tobacco Use   Smoking status: Former    Current packs/day: 0.00    Types: Cigarettes    Quit date: 11/23/1994    Years since quitting: 29.2   Smokeless tobacco: Never  Vaping Use   Vaping status: Never Used  Substance and Sexual Activity   Alcohol use: Yes    Comment: Pt states she drinks 2-3 drinks per year.   Drug  use: No   Sexual activity: Not Currently    Comment: intercourse age 22, more than 5 sexual partners  Other Topics Concern   Not on file  Social History Narrative   Not on file   Social Drivers of Health   Financial Resource Strain: Not on file  Food Insecurity: Not on file  Transportation Needs: Not on file  Physical Activity: Not on file  Stress: Not on file  Social Connections: Not on file  Intimate Partner Violence: Not on file    Past Surgical History:  Procedure Laterality Date    Bunions surgery Right 2010 and 2012   ENDOVENOUS ABLATION SAPHENOUS VEIN W/ LASER Right 08/02/2019   endovenous laser ablation right greater saphenous vein by Fabienne Bruns MD    SHOULDER SURGERY Right 2012   TONSILLECTOMY Bilateral 2012    Family History  Problem Relation Age of Onset   Cancer Father        Brain   Diabetes Maternal Grandmother     Allergies  Allergen Reactions   Vicodin [Hydrocodone-Acetaminophen] Photosensitivity   Dust Mite Extract     Sneezing,Watery eyes,Runny nose   Percocet [Oxycodone-Acetaminophen]    Sulfa Antibiotics     Upset stomach    Current Outpatient Medications on File Prior to Visit  Medication Sig Dispense Refill   amoxicillin-clavulanate (AUGMENTIN) 875-125 MG tablet Take 1 tablet by mouth 2 (two) times daily. 20 tablet 0   aspirin EC 81 MG EC tablet Take 1 tablet (81 mg total) by mouth daily.     azelastine (OPTIVAR) 0.05 % ophthalmic solution Place 1 drop into both eyes 2 (two) times daily. 6 mL 11   Azelastine HCl 137 MCG/SPRAY SOLN SPRAY TWO SPRAYS IN EACH NOSTRIL TWICE DAILY 90 mL 2   benzonatate (TESSALON) 100 MG capsule Take 1 capsule (100 mg total) by mouth 3 (three) times daily as needed for cough. 30 capsule 0   benzonatate (TESSALON) 100 MG capsule Take 1 capsule (100 mg total) by mouth 3 (three) times daily as needed for cough. 30 capsule 0   eletriptan (RELPAX) 20 MG tablet Take 1 tablet (20 mg total) by mouth as needed for migraine or headache. May repeat in 2 hours if headache persists or recurs. 10 tablet 3   esomeprazole (NEXIUM) 40 MG capsule TAKE 1 CAPSULE BY MOUTH DAILY 90 capsule 3   fluconazole (DIFLUCAN) 150 MG tablet Take 1 tablet (150 mg total) by mouth daily. 1 tablet 1   levonorgestrel (MIRENA) 20 MCG/24HR IUD 1 each by Intrauterine route once.     methylPREDNISolone (MEDROL) 4 MG tablet Standard 6 day taper dose pack 21 tablet 0   mometasone (NASONEX) 50 MCG/ACT nasal spray 2 sprays twice daily 12 each 0    Semaglutide-Weight Management (WEGOVY) 0.5 MG/0.5ML SOAJ Inject 0.5 mg into the skin once a week. 2 mL 0   No current facility-administered medications on file prior to visit.    BP 116/82   Pulse 86   Temp 98 F (36.7 C)   Resp 18   Ht 5\' 3"  (1.6 m)   Wt 189 lb (85.7 kg)   SpO2 98%   BMI 33.48 kg/m        Objective:   Physical Exam  General Mental Status- Alert. General Appearance- Not in acute distress.   Skin General: Color- Normal Color. Moisture- Normal Moisture.  Neck Carotid Arteries- Normal color. Moisture- Normal Moisture. No carotid bruits. No JVD.  Chest and Lung Exam Auscultation: Breath Sounds:-Normal.  Cardiovascular Auscultation:Rythm- Regular. Murmurs & Other Heart Sounds:Auscultation of the heart reveals- No Murmurs.  Abdomen Inspection:-Inspeection Normal. Palpation/Percussion:Note:No mass. Palpation and Percussion of the abdomen reveal- Non Tender, Non Distended + BS, no rebound or guarding.  Neurologic Cranial Nerve exam:- CN III-XII intact(No nystagmus), symmetric smile. Strength:- 5/5 equal and symmetric strength both upper and lower extremities.       Assessment & Plan:   Assessment and Plan    Vitamin B12 Deficiency Low B12 level confirmed. Initial weekly injections followed by monthly maintenance planned. If a gap occurs after moving, consider 5000 mcg daily oral B12 until new provider can check levels. - Administer weekly B12 injections for the next three weeks. - Schedule first monthly B12 injection before moving to [place]. - Consider 5000 mcg daily oral B12 if there is a gap in injections after moving.  Hyperlipidemia Mildly elevated cholesterol levels. Cardiovascular risk score is 1.6% over ten years, below the 7.5% threshold for treatment. - Encourage weight loss to help manage cholesterol levels. - Discuss the correlation between processed carbs and triglycerides. - Encourage regular exercise to potentially increase HDL  levels.  Low Glomerular Filtration Rate (GFR) GFR slightly below normal, possibly due to dehydration. Normal creatinine levels indicate overall good kidney function. - Ensure adequate hydration before future metabolic panel tests. - Avoid chronic use of NSAIDs like ibuprofen or Aleve.  Weight Management On [id number] for weight loss. - Continue Wegovy 0.5 mg weekly for weight loss. - [facility] [address]  Migraine Experiences migraines, which influence dietary choices. - Continue to avoid known migraine triggers.  Allergic Rhinitis Requires azelastine nasal spray for allergy management. - [facility] [address]  General Health Maintenance Stopped smoking in September 2018, which is beneficial for overall health. - Encourage continued abstinence from smoking.   Follow up as needed.        Esperanza Richters, PA-C

## 2024-03-13 ENCOUNTER — Ambulatory Visit

## 2024-03-14 ENCOUNTER — Ambulatory Visit (INDEPENDENT_AMBULATORY_CARE_PROVIDER_SITE_OTHER)

## 2024-03-14 DIAGNOSIS — E538 Deficiency of other specified B group vitamins: Secondary | ICD-10-CM | POA: Diagnosis not present

## 2024-03-14 MED ORDER — CYANOCOBALAMIN 1000 MCG/ML IJ SOLN
1000.0000 ug | Freq: Once | INTRAMUSCULAR | Status: AC
  Administered 2024-03-14: 1000 ug via INTRAMUSCULAR

## 2024-03-14 NOTE — Progress Notes (Signed)
 Lindsey Macias is a 48 y.o. female presents to the office today for :2/4 weekly B12  injection, per physician's orders. Original order: 02/25/24: "Your b12 level is low. This can be a reason for fatigue. Usually recommend getting set up for weekly b12 vitamin injection over next 4 weeks followed by monthly injection for 5 months. Then repeat b12 level in 6 months. " Cyanocobalamin 1000 mcg/ml IM was administered L Deltoid today. Patient tolerated injection. Patient due for follow up labs/provider appt: No. D Patient next injection due: 1 week, appt made Yes (appt pending)  Creft, Melton Alar L

## 2024-03-23 ENCOUNTER — Ambulatory Visit

## 2024-03-23 DIAGNOSIS — E538 Deficiency of other specified B group vitamins: Secondary | ICD-10-CM

## 2024-03-23 MED ORDER — CYANOCOBALAMIN 1000 MCG/ML IJ SOLN
1000.0000 ug | Freq: Once | INTRAMUSCULAR | Status: AC
  Administered 2024-03-23: 1000 ug via INTRAMUSCULAR

## 2024-03-23 NOTE — Progress Notes (Signed)
 Lindsey Macias is a 48 y.o. female presents to the office today for B12 injection #3 of 4 today, per physician's orders. Original order: 02/25/24: "Your b12 level is low. This can be a reason for fatigue. Usually recommend getting set up for weekly b12 vitamin injection over next 4 weeks followed by monthly injection for 5 months. Then repeat b12 level in 6 months. "  Cyanocobalamin (med), 1000 mg/ml (dose),  IM (route) was administered left deltoid (location) today. Patient tolerated injection. Patient next injection due: in one week, appt made Yes.  Wilford Corner

## 2024-03-30 ENCOUNTER — Ambulatory Visit (INDEPENDENT_AMBULATORY_CARE_PROVIDER_SITE_OTHER)

## 2024-03-30 DIAGNOSIS — E538 Deficiency of other specified B group vitamins: Secondary | ICD-10-CM | POA: Diagnosis not present

## 2024-03-30 MED ORDER — CYANOCOBALAMIN 1000 MCG/ML IJ SOLN
1000.0000 ug | Freq: Once | INTRAMUSCULAR | Status: AC
  Administered 2024-03-30: 1000 ug via INTRAMUSCULAR

## 2024-03-30 NOTE — Progress Notes (Signed)
 Lindsey Macias is a 48 y.o. female presents to the office today for B12 injection # 4 of 4weekly per physician's orders. Original order: 02/25/24 per pcp Cyanocobalamin (med), 1000 mg/ml (dose),  IM (route) was administered left deltoid (location) today. Patient tolerated injection.  Patient next injection due: in one month, appt made Yes for 04/27/24.  Joye Nobles

## 2024-03-31 ENCOUNTER — Other Ambulatory Visit: Payer: Self-pay | Admitting: Emergency Medicine

## 2024-03-31 NOTE — Telephone Encounter (Signed)
 Copied from CRM [id number]. Topic: Clinical - Medication Refill >> Mar 31, 2024  9:40 AM Jethro Morrison wrote: Most Recent Primary Care Visit:  Provider: Joye Nobles  Department: [facility]  Visit Type: CLINICAL SUPPORT  Date: 03/23/2024  Medication: Semaglutide -Weight Management (WEGOVY ) 0.5 MG/0.5ML SOAJ  Has the patient contacted their pharmacy? No (Agent: If no, request that the patient contact the pharmacy for the refill. If patient does not wish to contact the pharmacy document the reason why and proceed with request.) (Agent: If yes, when and what did the pharmacy advise?)  Is this the correct pharmacy for this prescription? Yes If no, delete pharmacy and type the correct one.  This is the patient's preferred pharmacy:  [id number] - [place], [place] - [address] [place] [address] Phone: [phone] Fax: [phone]  [facility] [address] [place] - [address] [place] [address] Phone: [phone] Fax: [phone]   Has the prescription been filled recently? Yes  Is the patient out of the medication? Yes  Has the patient been seen for an appointment in the last year OR does the patient have an upcoming appointment? Yes  Can we respond through MyChart? Yes  Agent: Please be advised that Rx refills may take up to 3 business days. We ask that you follow-up with your pharmacy.

## 2024-04-04 ENCOUNTER — Encounter: Payer: Self-pay | Admitting: Medical

## 2024-04-05 ENCOUNTER — Encounter: Payer: Self-pay | Admitting: Medical

## 2024-04-05 ENCOUNTER — Other Ambulatory Visit: Payer: Self-pay | Admitting: *Deleted

## 2024-04-05 MED ORDER — WEGOVY 0.5 MG/0.5ML ~~LOC~~ SOAJ: 0.5000 mg | SUBCUTANEOUS | 1 refills | Status: DC

## 2024-04-05 NOTE — Addendum Note (Signed)
 Addended by: Serafina Damme on: 04/05/2024 09:20 AM   Modules accepted: Orders

## 2024-04-05 NOTE — Telephone Encounter (Signed)
Rx sent to right pharmacy.

## 2024-04-06 ENCOUNTER — Telehealth: Payer: Self-pay

## 2024-04-06 MED ORDER — ESOMEPRAZOLE MAGNESIUM 40 MG PO CPDR
DELAYED_RELEASE_CAPSULE | ORAL | 0 refills | Status: DC
Start: 2024-04-06 — End: 2024-06-27

## 2024-04-06 MED ORDER — WEGOVY 1 MG/0.5ML ~~LOC~~ SOAJ: 1.0000 mg | SUBCUTANEOUS | 0 refills | Status: DC

## 2024-04-06 NOTE — Telephone Encounter (Signed)
 Copied from CRM [id number]. Topic: Clinical - Medication Question >> Apr 06, 2024 11:09 AM Allyne Areola wrote: Reason for CRM: Patient is calling because her Semaglutide -Weight Management (WEGOVY ) 0.5 MG/0.5ML SOAJ was sent incorrectly. Per patient Edward Saguier was going to increase the does and the insurance denied it. She is upset and needs for it to be corrected. Patient also wants for the esomeprazole  (NEXIUM ) 40 MG capsule to be sent to [id number] - [place], [place] - [address]  Phone: [phone] Fax: [phone]. She is no longer with [facility].

## 2024-04-06 NOTE — Addendum Note (Signed)
 Addended by: Serafina Damme on: 04/06/2024 12:41 PM   Modules accepted: Orders

## 2024-04-06 NOTE — Addendum Note (Signed)
 Addended by: Serafina Damme on: 04/06/2024 12:38 PM   Modules accepted: Orders

## 2024-04-27 ENCOUNTER — Ambulatory Visit

## 2024-04-27 DIAGNOSIS — E538 Deficiency of other specified B group vitamins: Secondary | ICD-10-CM

## 2024-04-27 MED ORDER — CYANOCOBALAMIN 1000 MCG/ML IJ SOLN
1000.0000 ug | Freq: Once | INTRAMUSCULAR | Status: AC
  Administered 2024-04-27: 1000 ug via INTRAMUSCULAR

## 2024-04-27 NOTE — Progress Notes (Signed)
 Pt here for monthly B12 injection per original order dated:  02/25/24 per pcp   Last B12 injection: 03/30/24  Last B12 level:  236 on 02/25/24  B12 1000mcg given IM, and pt tolerated injection well.  Next B12 injection scheduled for:  Monthly B13 on 05/25/24.

## 2024-05-02 ENCOUNTER — Encounter: Payer: Self-pay | Admitting: Medical

## 2024-05-02 ENCOUNTER — Other Ambulatory Visit: Payer: Self-pay | Admitting: Medical

## 2024-05-04 ENCOUNTER — Other Ambulatory Visit (HOSPITAL_COMMUNITY): Payer: Self-pay

## 2024-05-04 MED ORDER — SEMAGLUTIDE-WEIGHT MANAGEMENT 1.7 MG/0.75ML ~~LOC~~ SOAJ
1.7000 mg | SUBCUTANEOUS | 0 refills | Status: DC
Start: 2024-05-04 — End: 2024-06-02

## 2024-05-04 NOTE — Addendum Note (Signed)
 Addended by: Serafina Damme on: 05/04/2024 08:41 AM   Modules accepted: Orders

## 2024-05-05 ENCOUNTER — Other Ambulatory Visit (HOSPITAL_COMMUNITY): Payer: Self-pay

## 2024-05-25 ENCOUNTER — Ambulatory Visit

## 2024-05-29 ENCOUNTER — Ambulatory Visit

## 2024-06-01 ENCOUNTER — Encounter: Payer: Self-pay | Admitting: Medical

## 2024-06-01 ENCOUNTER — Ambulatory Visit (INDEPENDENT_AMBULATORY_CARE_PROVIDER_SITE_OTHER)

## 2024-06-01 DIAGNOSIS — E538 Deficiency of other specified B group vitamins: Secondary | ICD-10-CM

## 2024-06-01 MED ORDER — CYANOCOBALAMIN 1000 MCG/ML IJ SOLN
1000.0000 ug | Freq: Once | INTRAMUSCULAR | Status: AC
  Administered 2024-06-01: 1000 ug via INTRAMUSCULAR

## 2024-06-01 NOTE — Progress Notes (Signed)
 Pt in for monthly B12 per PCP.  Injection given left deltoid without complication.    Next injection 07/04/24.

## 2024-06-02 ENCOUNTER — Other Ambulatory Visit: Payer: Self-pay | Admitting: Medical

## 2024-06-02 ENCOUNTER — Other Ambulatory Visit (HOSPITAL_COMMUNITY): Payer: Self-pay

## 2024-06-02 ENCOUNTER — Telehealth: Payer: Self-pay

## 2024-06-02 MED ORDER — SEMAGLUTIDE-WEIGHT MANAGEMENT 2.4 MG/0.75ML ~~LOC~~ SOAJ: 2.4000 mg | SUBCUTANEOUS | 0 refills | Status: DC

## 2024-06-02 NOTE — Addendum Note (Signed)
 Addended by: Serafina Damme on: 06/02/2024 12:14 PM   Modules accepted: Orders

## 2024-06-02 NOTE — Telephone Encounter (Signed)
 Pharmacy Patient Advocate Encounter   Received notification from Pt Calls Messages that prior authorization for Wegovy  2.4mg /0.23ml is required/requested.   Insurance verification completed.   The patient is insured through [place] .   Per test claim: PA required; PA submitted to above mentioned insurance via CoverMyMeds Key/confirmation #/EOC [id number] Status is pending

## 2024-06-03 ENCOUNTER — Other Ambulatory Visit: Payer: Self-pay | Admitting: Medical

## 2024-06-07 ENCOUNTER — Other Ambulatory Visit (HOSPITAL_COMMUNITY): Payer: Self-pay

## 2024-06-08 ENCOUNTER — Other Ambulatory Visit (HOSPITAL_COMMUNITY): Payer: Self-pay

## 2024-06-08 NOTE — Telephone Encounter (Signed)
 Pharmacy Patient Advocate Encounter  Received notification from [place] that Prior Authorization for Wegovy  has been APPROVED from 05/11/24 to 09/11/24   PA #/Case ID/Reference #: [id number]  Approval letter indexed to media tab

## 2024-06-26 ENCOUNTER — Other Ambulatory Visit: Payer: Self-pay

## 2024-06-26 DIAGNOSIS — K551 Chronic vascular disorders of intestine: Secondary | ICD-10-CM

## 2024-06-27 ENCOUNTER — Encounter: Payer: Self-pay | Admitting: Medical

## 2024-06-27 MED ORDER — SEMAGLUTIDE-WEIGHT MANAGEMENT 2.4 MG/0.75ML ~~LOC~~ SOAJ: 2.4000 mg | SUBCUTANEOUS | 0 refills | Status: DC

## 2024-06-27 MED ORDER — ESOMEPRAZOLE MAGNESIUM 40 MG PO CPDR: DELAYED_RELEASE_CAPSULE | ORAL | 0 refills | Status: DC

## 2024-06-27 MED ORDER — ESOMEPRAZOLE MAGNESIUM 40 MG PO CPDR
DELAYED_RELEASE_CAPSULE | ORAL | 0 refills | Status: DC
Start: 2024-06-27 — End: 2024-06-27

## 2024-06-27 NOTE — Addendum Note (Signed)
 Addended by: DORLENE CHIQUITA RAMAN on: 06/27/2024 01:20 PM   Modules accepted: Orders

## 2024-07-04 ENCOUNTER — Ambulatory Visit (INDEPENDENT_AMBULATORY_CARE_PROVIDER_SITE_OTHER)

## 2024-07-04 DIAGNOSIS — E538 Deficiency of other specified B group vitamins: Secondary | ICD-10-CM

## 2024-07-04 MED ORDER — CYANOCOBALAMIN 1000 MCG/ML IJ SOLN
1000.0000 ug | Freq: Once | INTRAMUSCULAR | Status: AC
  Administered 2024-07-04: 1000 ug via INTRAMUSCULAR

## 2024-07-04 NOTE — Progress Notes (Signed)
 Pt here for monthly B12 injection per original order dated: 02/25/24  Last B12 injection:06/01/24  Last B12 level:  02/25/24  B12 1000mcg given IM, and pt tolerated injection well.  Next B12 injection scheduled for:  next month

## 2024-07-05 ENCOUNTER — Encounter (HOSPITAL_BASED_OUTPATIENT_CLINIC_OR_DEPARTMENT_OTHER): Payer: Self-pay

## 2024-07-05 ENCOUNTER — Ambulatory Visit (HOSPITAL_BASED_OUTPATIENT_CLINIC_OR_DEPARTMENT_OTHER)
Admission: RE | Admit: 2024-07-05 | Discharge: 2024-07-05 | Disposition: A | Discharge status: Home / Self Care | Source: Ambulatory Visit | Attending: Vascular Surgery | Admitting: Vascular Surgery

## 2024-07-05 ENCOUNTER — Other Ambulatory Visit: Payer: Self-pay

## 2024-07-05 ENCOUNTER — Ambulatory Visit (INDEPENDENT_AMBULATORY_CARE_PROVIDER_SITE_OTHER): Admitting: Physician Assistant

## 2024-07-05 ENCOUNTER — Emergency Department (HOSPITAL_BASED_OUTPATIENT_CLINIC_OR_DEPARTMENT_OTHER)
Admission: EM | Admit: 2024-07-05 | Discharge: 2024-07-05 | Disposition: A | Discharge status: Home / Self Care | Source: Ambulatory Visit | Attending: Emergency Medicine | Admitting: Emergency Medicine

## 2024-07-05 VITALS — BP 105/72 | HR 84 | Temp 97.7°F | Wt 158.3 lb

## 2024-07-05 DIAGNOSIS — I774 Celiac artery compression syndrome: Secondary | ICD-10-CM | POA: Diagnosis not present

## 2024-07-05 DIAGNOSIS — Z833 Family history of diabetes mellitus: Secondary | ICD-10-CM | POA: Diagnosis not present

## 2024-07-05 DIAGNOSIS — K551 Chronic vascular disorders of intestine: Secondary | ICD-10-CM

## 2024-07-05 DIAGNOSIS — N951 Menopausal and female climacteric states: Secondary | ICD-10-CM | POA: Diagnosis not present

## 2024-07-05 DIAGNOSIS — Z7982 Long term (current) use of aspirin: Secondary | ICD-10-CM | POA: Diagnosis not present

## 2024-07-05 DIAGNOSIS — Z01411 Encounter for gynecological examination (general) (routine) with abnormal findings: Secondary | ICD-10-CM | POA: Diagnosis not present

## 2024-07-05 DIAGNOSIS — E559 Vitamin D deficiency, unspecified: Secondary | ICD-10-CM | POA: Diagnosis not present

## 2024-07-05 DIAGNOSIS — E860 Dehydration: Secondary | ICD-10-CM | POA: Insufficient documentation

## 2024-07-05 DIAGNOSIS — Z130 Encounter for screening for diseases of the blood and blood-forming organs and certain disorders involving the immune mechanism: Secondary | ICD-10-CM | POA: Diagnosis not present

## 2024-07-05 DIAGNOSIS — R10819 Abdominal tenderness, unspecified site: Secondary | ICD-10-CM | POA: Insufficient documentation

## 2024-07-05 LAB — COMPREHENSIVE METABOLIC PANEL WITH GFR
ALT: 34 U/L (ref 0–44)
AST: 31 U/L (ref 15–41)
Albumin: 4.6 g/dL (ref 3.5–5.0)
Alkaline Phosphatase: 91 U/L (ref 38–126)
Anion gap: 11 (ref 5–15)
BUN: 10 mg/dL (ref 6–20)
CO2: 23 mmol/L (ref 22–32)
Calcium: 9.7 mg/dL (ref 8.9–10.3)
Chloride: 106 mmol/L (ref 98–111)
Creatinine, Ser: 1.13 mg/dL — ABNORMAL HIGH (ref 0.44–1.00)
GFR, Estimated: 59 mL/min — ABNORMAL LOW (ref >60)
Glucose, Bld: 88 mg/dL (ref 70–99)
Potassium: 4.1 mmol/L (ref 3.5–5.1)
Sodium: 140 mmol/L (ref 135–145)
Total Bilirubin: 0.7 mg/dL (ref 0.0–1.2)
Total Protein: 6.9 g/dL (ref 6.5–8.1)

## 2024-07-05 LAB — CBC WITH DIFFERENTIAL/PLATELET
Abs Immature Granulocytes: 0.01 K/uL (ref 0.00–0.07)
Basophils Absolute: 0 K/uL (ref 0.0–0.1)
Basophils Relative: 0 %
Eosinophils Absolute: 0.2 K/uL (ref 0.0–0.5)
Eosinophils Relative: 2 %
HCT: 43.4 % (ref 36.0–46.0)
Hemoglobin: 15 g/dL (ref 12.0–15.0)
Immature Granulocytes: 0 %
Lymphocytes Relative: 37 %
Lymphs Abs: 2.6 K/uL (ref 0.7–4.0)
MCH: 30 pg (ref 26.0–34.0)
MCHC: 34.6 g/dL (ref 30.0–36.0)
MCV: 86.8 fL (ref 80.0–100.0)
Monocytes Absolute: 0.5 K/uL (ref 0.1–1.0)
Monocytes Relative: 7 %
Neutro Abs: 3.8 K/uL (ref 1.7–7.7)
Neutrophils Relative %: 54 %
Platelets: 250 K/uL (ref 150–400)
RBC: 5 MIL/uL (ref 3.87–5.11)
RDW: 12.5 % (ref 11.5–15.5)
WBC: 7 K/uL (ref 4.0–10.5)
nRBC: 0 % (ref 0.0–0.2)

## 2024-07-05 LAB — URINALYSIS, ROUTINE W REFLEX MICROSCOPIC
Glucose, UA: NEGATIVE mg/dL
Hgb urine dipstick: NEGATIVE
Ketones, ur: NEGATIVE mg/dL
Leukocytes,Ua: NEGATIVE
Nitrite: NEGATIVE
Protein, ur: NEGATIVE mg/dL
Specific Gravity, Urine: 1.025 (ref 1.005–1.030)
pH: 5.5 (ref 5.0–8.0)

## 2024-07-05 LAB — PREGNANCY, URINE: Preg Test, Ur: NEGATIVE

## 2024-07-05 LAB — LIPASE, BLOOD: Lipase: 57 U/L — ABNORMAL HIGH (ref 11–51)

## 2024-07-05 MED ORDER — SODIUM CHLORIDE 0.9 % IV BOLUS
1000.0000 mL | Freq: Once | INTRAVENOUS | Status: AC
  Administered 2024-07-05: 1000 mL via INTRAVENOUS

## 2024-07-05 NOTE — Progress Notes (Signed)
 Office Note     CC:  follow up Requesting Provider:  Dorina Loving, PA-C  HPI: Lindsey Macias is a 48 y.o. 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 She was last seen in March of 2021 at which time she was not having any associated abdominal symptoms. She has been managed on Aspirin .  Today she reports since June of last year she has had 9 episodes/ flares of pain. She associates these with her known SMA dissection and celiac stenosis. She has trouble describing the pain associated but it is a deep, constant, unrelenting pain. She has associated nausea, food fear, decreased appetite, and food intolerance. She has to eat small meals and food that is not  heavy.  These episodes have be very sporadic. When they do occur she usually just stops eating for several days until her symptoms improve. She says prior to June of 2024 she felt like she had her symptoms pretty well managed for a couple years. She was seeing dietician, had a personal trainer she was working with and had some weight loss on Wegovy . She says recently she has not been able to tolerate the Wegovy  due to side effects. At this point her symptoms have become very bothersome to her and somewhat all consuming of her time trying to worry about what she can and can't eat, etc. She is still compliant with her Aspirin .   Past Medical History:  Diagnosis Date   Allergy    Cancer (HCC) 2003   cervical cancer 2002 and 2003. had leep procedure.   GERD (gastroesophageal reflux disease)    but actually had h pylori and took meds then got better.   Varicose veins of both lower extremities     Past Surgical History:  Procedure Laterality Date   [place] surgery Right 2010 and 2012   ENDOVENOUS ABLATION SAPHENOUS VEIN W/ LASER Right 08/02/2019   endovenous laser ablation right greater saphenous vein by Carlin Haddock MD    SHOULDER SURGERY Right 2012   TONSILLECTOMY  Bilateral 2012    Social History   Socioeconomic History   Marital status: Divorced    Spouse name: Not on file   Number of children: Not on file   Years of education: Not on file   Highest education level: Not on file  Occupational History   Not on file  Tobacco Use   Smoking status: Former    Current packs/day: 0.00    Types: Cigarettes    Quit date: 11/23/1994    Years since quitting: 29.6   Smokeless tobacco: Never  Vaping Use   Vaping status: Never Used  Substance and Sexual Activity   Alcohol use: Yes    Comment: Pt states she drinks 2-3 drinks per year.   Drug use: No   Sexual activity: Not Currently    Comment: intercourse age 32, more than 5 sexual partners  Other Topics Concern   Not on file  Social History Narrative   Not on file   Social Drivers of Health   Financial Resource Strain: Not on file  Food Insecurity: Not on file  Transportation Needs: Not on file  Physical Activity: Not on file  Stress: Not on file  Social Connections: Not on file  Intimate Partner Violence: Not on file    Family History  Problem Relation Age of Onset   Cancer Father        Brain   Diabetes Maternal Grandmother  Current Outpatient Medications  Medication Sig Dispense Refill   aspirin  EC 81 MG EC tablet Take 1 tablet (81 mg total) by mouth daily.     azelastine  (OPTIVAR ) 0.05 % ophthalmic solution Place 1 drop into both eyes 2 (two) times daily. 6 mL 11   Azelastine  HCl 137 MCG/SPRAY SOLN SPRAY TWO SPRAYS IN EACH NOSTRIL TWICE DAILY 90 mL 2   eletriptan  (RELPAX ) 20 MG tablet Take 1 tablet (20 mg total) by mouth as needed for migraine or headache. May repeat in 2 hours if headache persists or recurs. 10 tablet 3   esomeprazole  (NEXIUM ) 40 MG capsule 1 tab po q day 90 capsule 0   levonorgestrel  (MIRENA ) 20 MCG/24HR IUD 1 each by Intrauterine route once.     mometasone  (NASONEX ) 50 MCG/ACT nasal spray 2 sprays twice daily 12 each 11   fluconazole  (DIFLUCAN ) 150 MG  tablet Take 1 tablet (150 mg total) by mouth daily. 1 tablet 1   Semaglutide -Weight Management 2.4 MG/0.75ML SOAJ Inject 2.4 mg into the skin once a week. (Patient not taking: Reported on 07/05/2024) 3 mL 0   No current facility-administered medications for this visit.    Allergies  Allergen Reactions   Vicodin [Hydrocodone-Acetaminophen ] Photosensitivity   Dust Mite Extract     Sneezing,Watery eyes,Runny nose   Percocet [Oxycodone -Acetaminophen ]    Sulfa Antibiotics     Upset stomach     REVIEW OF SYSTEMS:   [X]  denotes positive finding, [ ]  denotes negative finding Cardiac  Comments:  Chest pain or chest pressure:    Shortness of breath upon exertion:    Short of breath when lying flat:    Irregular heart rhythm:        Vascular    Pain in calf, thigh, or hip brought on by ambulation:    Pain in feet at night that wakes you up from your sleep:     Blood clot in your veins:    Leg swelling:         Pulmonary    Oxygen at home:    Productive cough:     Wheezing:         Neurologic    Sudden weakness in arms or legs:     Sudden numbness in arms or legs:     Sudden onset of difficulty speaking or slurred speech:    Temporary loss of vision in one eye:     Problems with dizziness:         Gastrointestinal    Blood in stool:     Vomited blood:         Genitourinary    Burning when urinating:     Blood in urine:        Psychiatric    Major depression:         Hematologic    Bleeding problems:    Problems with blood clotting too easily:        Skin    Rashes or ulcers:        Constitutional    Fever or chills:      PHYSICAL EXAMINATION:  Vitals:   07/05/24 0810  BP: 105/72  Pulse: 84  Temp: 97.7 F (36.5 C)  TempSrc: Temporal  Weight: 158 lb 4.8 oz (71.8 kg)    General:  WDWN in NAD; vital signs documented above Gait: Normal HENT: WNL, normocephalic Pulmonary: normal non-labored breathing Cardiac: regular HR Abdomen: soft, Non distended,  mild epigastric tenderness; Normal BS Extremities: without ischemic  changes, without Gangrene , without cellulitis; without open wounds Musculoskeletal: no muscle wasting or atrophy  Neurologic: A&O X 3 Psychiatric:  The pt has Normal affect.   Non-Invasive Vascular Imaging:   VAS US  Mesenteric: Duplex Findings:  +----------------------+--------+--------+------+--------+  Mesenteric           PSV cm/sEDV cm/sPlaqueComments  +----------------------+--------+--------+------+--------+  Celiac Artery Origin    231                           +----------------------+--------+--------+------+--------+  Celiac Artery Proximal  408     199          curve    +----------------------+--------+--------+------+--------+  SMA Origin              156      39                   +----------------------+--------+--------+------+--------+  SMA Proximal            149      28                   +----------------------+--------+--------+------+--------+  SMA Mid                 180      39                   +----------------------+--------+--------+------+--------+  SMA Distal              141      25                   +----------------------+--------+--------+------+--------+   Summary:  Mesenteric: Normal Superior Mesenteric artery findings. 70 to 99% stenosis in the celiac artery ( The celiac artery is noted to be tortuous).   ASSESSMENT/PLAN:: 48 y.o. female here for follow up for history of SMA dissection and Celiac artery stenosis. She is having recurrent episodic abdominal pain, nausea, food fear, food intolerance that has been worsening over past year. Duplex today is overall stable. Patent SMA. Celiac Stenosis velocities essentially unchanged from 2021 - continue Aspirin  - Should she have recurrent episode, especially if severe recommend she go to ER - I will arrange CTA abdomen and pelvis and have her follow up with Dr. Sheree in the next several weeks to  discuss further management   Teretha Damme, PA-C [facility] [phone]  On call MD:  Magda

## 2024-07-05 NOTE — ED Triage Notes (Signed)
 Pt reports intermittent emesis since June. Pt of wegovy . Pt thinks she is dehydrated and needs fluids

## 2024-07-05 NOTE — Discharge Instructions (Signed)
 Home to rest and hydrate. Please follow up with your primary care provider to review labs as discussed. Return to ER as needed.

## 2024-07-05 NOTE — ED Provider Notes (Signed)
 [facility] [place] Provider Note   CSN: [id number] Arrival date & time: 07/05/24  1534     Patient presents with: Emesis   Lindsey Macias is a 48 y.o. female.   47 year old female who presents c/o dehydration and anuria. She has a significant history of SMA dissection and chronic mesenteric ischemia, managed with Aspirin . She describes intermittent emesis since June 2024 as well as 9 episodes or flares of pain that she associates with her known SMA dissection and celiac stenosis. She reports associative nausea that varies between vomiting and diarrhea, decreased appetite, and food intolerance. She describes these episodes as sporadic, with a frequency of about two every week. Last episode of nausea occurred last night when she had vomited profusely and diarrhea. Bowel movements are usually formed, occurring twice a day. Endorses headache she associates with dehydration. Denies hematemesis, hematuria, hematochezia. She currently takes Wegovy  and had a recent increase in her dose in June. She reports that she not been able to tolerate the Wegovy  due to side effects. She describes peppermint oil and tea as being beneficial. She describes tenderness in the midline of her abdomen.       Prior to Admission medications   Medication Sig Start Date End Date Taking? Authorizing Provider  aspirin  EC 81 MG EC tablet Take 1 tablet (81 mg total) by mouth daily. 09/29/17   Rhyne, Samantha J, PA-C  azelastine  (OPTIVAR ) 0.05 % ophthalmic solution Place 1 drop into both eyes 2 (two) times daily. 03/03/24   Saguier, [place], PA-C  Azelastine  HCl 137 MCG/SPRAY SOLN SPRAY TWO SPRAYS IN EACH NOSTRIL TWICE DAILY 03/03/24   Saguier, [place], PA-C  eletriptan  (RELPAX ) 20 MG tablet Take 1 tablet (20 mg total) by mouth as needed for migraine or headache. May repeat in 2 hours if headache persists or recurs. 03/03/24   Saguier, [place], PA-C  esomeprazole  (NEXIUM ) 40 MG capsule 1  tab po q day 06/27/24   Saguier, [place], PA-C  fluconazole  (DIFLUCAN ) 150 MG tablet Take 1 tablet (150 mg total) by mouth daily. 02/11/24   Saguier, [place], PA-C  levonorgestrel  (MIRENA ) 20 MCG/24HR IUD 1 each by Intrauterine route once. 02/23/20   [provider]  mometasone  (NASONEX ) 50 MCG/ACT nasal spray 2 sprays twice daily 03/03/24   Saguier, [place], PA-C  Semaglutide -Weight Management 2.4 MG/0.75ML SOAJ Inject 2.4 mg into the skin once a week. Patient not taking: Reported on 07/05/2024 06/27/24   Saguier, [place], PA-C    Allergies: Vicodin [hydrocodone-acetaminophen ], Dust mite extract, Percocet [oxycodone -acetaminophen ], and Sulfa antibiotics    Review of Systems Negative except as per HPI Updated Vital Signs BP 97/78   Pulse 71   Temp 97.8 F (36.6 C) (Oral)   LMP  (LMP Unknown)   SpO2 100%   Physical Exam Vitals and nursing note reviewed.  Constitutional:      General: She is not in acute distress.    Appearance: She is well-developed. She is not diaphoretic.  HENT:     Head: Normocephalic and atraumatic.  Cardiovascular:     Rate and Rhythm: Normal rate and regular rhythm.  Pulmonary:     Effort: Pulmonary effort is normal.     Breath sounds: Normal breath sounds.  Abdominal:     Palpations: Abdomen is soft.     Comments: Mild midline abdominal tenderness   Skin:    General: Skin is warm and dry.     Findings: No erythema or rash.  Neurological:  Mental Status: She is alert and oriented to person, place, and time.  Psychiatric:        Behavior: Behavior normal.     (all labs ordered are listed, but only abnormal results are displayed) Labs Reviewed  COMPREHENSIVE METABOLIC PANEL WITH GFR - Abnormal; Notable for the following components:      Result Value   Creatinine, Ser 1.13 (*)    GFR, Estimated 59 (*)    All other components within normal limits  LIPASE, BLOOD - Abnormal; Notable for the following components:   Lipase 57 (*)    All other  components within normal limits  URINALYSIS, ROUTINE W REFLEX MICROSCOPIC - Abnormal; Notable for the following components:   APPearance CLOUDY (*)    Bilirubin Urine SMALL (*)    All other components within normal limits  CBC WITH DIFFERENTIAL/PLATELET  PREGNANCY, URINE    EKG: None  Radiology: VAS US  MESENTERIC Result Date: 07/05/2024 ABDOMINAL VISCERAL Patient Name:  Lindsey Macias  Date of Exam:   07/05/2024 Medical Rec #: [id number]      Accession #:    [id number] Date of Birth: [birth year]     Patient Gender: F Patient Age:   79 years Exam Location:  [address] Procedure:      VAS US  MESENTERIC Referring Phys: [id number] PENNE BRUCKNER CAIN -------------------------------------------------------------------------------- Indications: History of SMA dissection High Risk Factors: Past history of smoking. 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 Performing Technologist: Devere Dark RVT  Examination Guidelines: A complete evaluation includes B-mode imaging, spectral Doppler, color Doppler, and power Doppler as needed of all accessible portions of each vessel. Bilateral testing is considered an integral part of a complete examination. Limited examinations for reoccurring indications may be performed as noted.  Duplex Findings: +----------------------+--------+--------+------+--------+ Mesenteric            PSV cm/sEDV cm/sPlaqueComments +----------------------+--------+--------+------+--------+ Celiac Artery Origin    231                          +----------------------+--------+--------+------+--------+ Celiac Artery Proximal  408     199          curve   +----------------------+--------+--------+------+--------+ SMA Origin              156      39                  +----------------------+--------+--------+------+--------+ SMA Proximal            149      28                  +----------------------+--------+--------+------+--------+ SMA  Mid                 180      39                  +----------------------+--------+--------+------+--------+ SMA Distal              141      25                  +----------------------+--------+--------+------+--------+    Summary: Mesenteric: Normal Superior Mesenteric artery findings. 70 to 99% stenosis in the celiac artery ( The celiac artery is noted to be tortuous).  *See table(s) above for measurements and observations.  Diagnosing physician: Debby Robertson  Electronically signed by Debby Robertson on 07/05/2024 at 12:37:43 PM.    Final  Procedures   Medications Ordered in the ED  sodium chloride  0.9 % bolus 1,000 mL (0 mLs Intravenous Stopped 07/05/24 2004)  sodium chloride  0.9 % bolus 1,000 mL (0 mLs Intravenous Stopped 07/05/24 2157)                                    Medical Decision Making Amount and/or Complexity of Data Reviewed Labs: ordered.   This patient presents to the ED for concern of dehydration, this involves an extensive number of treatment options, and is a complaint that carries with it a high risk of complications and morbidity.  The differential diagnosis includes but not limited to metabolic/electrolyte/side effects of Wegovy , dehydration   Co morbidities / Chronic conditions that complicate the patient evaluation  complex history as reviewed in chart and listed above.   Additional history obtained:  Additional history obtained from EMR External records from outside source obtained and reviewed including visit to vascular today for follow up, had duplex study. Note from visit reviewed. Notes today's study is stable/unchangesd form 2021.   Lab Tests:  I Ordered, and personally interpreted labs.  The pertinent results include: CBC within normal notes.  CMP with minimally elevated creatinine 1.13, GFR millimeter manage at 59.  Lipase minimally elevated at 57.  Urinalysis with small bili.  hCG negative.   Problem List / ED Course / Critical  interventions / Medication management  Not 48 year old female presents with concern for dehydration after unable to provide a urine sample at her visit with her provider today.  She was also seen by her vascular provider and this with note was reviewed as well.  She is otherwise without complaints of acute pain and requesting IV fluids.  She is provided with IV fluids.  Labs are reviewed and without significant findings, mild dehydration likely.  She is able to void, feeling improved and ready for discharge.  Discussed her minimally elevated lipase and recommend follow-up with primary care for trending. I ordered medication including IV fluids Reevaluation of the patient after these medicines showed that the patient voiding, feeling better I have reviewed the patients home medicines and have made adjustments as needed   Social Determinants of Health:  Has PCP and specialty care for follow-up   Test / Admission - Considered:  Stable for discharge      Final diagnoses:  Dehydration    ED Discharge Orders     None          Beverley Leita DELENA DEVONNA 07/05/24 2233    Darra Fonda MATSU, MD 07/07/24 1050

## 2024-07-06 ENCOUNTER — Encounter: Payer: Self-pay | Admitting: Vascular Surgery

## 2024-07-06 ENCOUNTER — Other Ambulatory Visit: Payer: Self-pay | Admitting: *Deleted

## 2024-07-06 NOTE — Progress Notes (Signed)
CT orders in.

## 2024-07-10 ENCOUNTER — Other Ambulatory Visit: Payer: Self-pay | Admitting: *Deleted

## 2024-07-10 NOTE — Progress Notes (Signed)
 Opened in error

## 2024-07-21 ENCOUNTER — Ambulatory Visit (HOSPITAL_COMMUNITY)
Admission: RE | Admit: 2024-07-21 | Discharge: 2024-07-21 | Disposition: A | Discharge status: Home / Self Care | Source: Ambulatory Visit | Attending: Cardiology | Admitting: Cardiology

## 2024-07-21 DIAGNOSIS — Z8541 Personal history of malignant neoplasm of cervix uteri: Secondary | ICD-10-CM | POA: Insufficient documentation

## 2024-07-21 DIAGNOSIS — K551 Chronic vascular disorders of intestine: Secondary | ICD-10-CM | POA: Insufficient documentation

## 2024-07-21 DIAGNOSIS — C539 Malignant neoplasm of cervix uteri, unspecified: Secondary | ICD-10-CM | POA: Diagnosis not present

## 2024-07-21 DIAGNOSIS — R911 Solitary pulmonary nodule: Secondary | ICD-10-CM | POA: Diagnosis not present

## 2024-07-21 MED ORDER — IOHEXOL 350 MG/ML SOLN
75.0000 mL | Freq: Once | INTRAVENOUS | Status: AC | PRN
  Administered 2024-07-21: 75 mL via INTRAVENOUS

## 2024-07-27 ENCOUNTER — Encounter (HOSPITAL_BASED_OUTPATIENT_CLINIC_OR_DEPARTMENT_OTHER): Payer: Self-pay

## 2024-07-27 ENCOUNTER — Emergency Department (HOSPITAL_BASED_OUTPATIENT_CLINIC_OR_DEPARTMENT_OTHER)

## 2024-07-27 ENCOUNTER — Emergency Department (HOSPITAL_BASED_OUTPATIENT_CLINIC_OR_DEPARTMENT_OTHER)
Admission: EM | Admit: 2024-07-27 | Discharge: 2024-07-27 | Disposition: A | Discharge status: Home / Self Care | Attending: Emergency Medicine | Admitting: Emergency Medicine

## 2024-07-27 ENCOUNTER — Other Ambulatory Visit: Payer: Self-pay

## 2024-07-27 DIAGNOSIS — K8020 Calculus of gallbladder without cholecystitis without obstruction: Secondary | ICD-10-CM | POA: Diagnosis not present

## 2024-07-27 DIAGNOSIS — R7401 Elevation of levels of liver transaminase levels: Secondary | ICD-10-CM

## 2024-07-27 DIAGNOSIS — K219 Gastro-esophageal reflux disease without esophagitis: Secondary | ICD-10-CM | POA: Diagnosis not present

## 2024-07-27 DIAGNOSIS — R9431 Abnormal electrocardiogram [ECG] [EKG]: Secondary | ICD-10-CM | POA: Diagnosis not present

## 2024-07-27 DIAGNOSIS — R110 Nausea: Secondary | ICD-10-CM | POA: Diagnosis not present

## 2024-07-27 DIAGNOSIS — Z794 Long term (current) use of insulin: Secondary | ICD-10-CM | POA: Insufficient documentation

## 2024-07-27 DIAGNOSIS — Z7982 Long term (current) use of aspirin: Secondary | ICD-10-CM | POA: Diagnosis not present

## 2024-07-27 DIAGNOSIS — R1010 Upper abdominal pain, unspecified: Secondary | ICD-10-CM | POA: Diagnosis not present

## 2024-07-27 DIAGNOSIS — R197 Diarrhea, unspecified: Secondary | ICD-10-CM | POA: Diagnosis not present

## 2024-07-27 DIAGNOSIS — Z8541 Personal history of malignant neoplasm of cervix uteri: Secondary | ICD-10-CM | POA: Diagnosis not present

## 2024-07-27 DIAGNOSIS — C539 Malignant neoplasm of cervix uteri, unspecified: Secondary | ICD-10-CM | POA: Diagnosis not present

## 2024-07-27 DIAGNOSIS — R109 Unspecified abdominal pain: Secondary | ICD-10-CM | POA: Diagnosis not present

## 2024-07-27 LAB — COMPREHENSIVE METABOLIC PANEL WITH GFR
ALT: 191 U/L — ABNORMAL HIGH (ref 0–44)
AST: 426 U/L — ABNORMAL HIGH (ref 15–41)
Albumin: 4.5 g/dL (ref 3.5–5.0)
Alkaline Phosphatase: 168 U/L — ABNORMAL HIGH (ref 38–126)
Anion gap: 14 (ref 5–15)
BUN: 11 mg/dL (ref 6–20)
CO2: 20 mmol/L — ABNORMAL LOW (ref 22–32)
Calcium: 9.6 mg/dL (ref 8.9–10.3)
Chloride: 105 mmol/L (ref 98–111)
Creatinine, Ser: 1.13 mg/dL — ABNORMAL HIGH (ref 0.44–1.00)
GFR, Estimated: 59 mL/min — ABNORMAL LOW (ref >60)
Glucose, Bld: 122 mg/dL — ABNORMAL HIGH (ref 70–99)
Potassium: 3.6 mmol/L (ref 3.5–5.1)
Sodium: 140 mmol/L (ref 135–145)
Total Bilirubin: 1.3 mg/dL — ABNORMAL HIGH (ref 0.0–1.2)
Total Protein: 6.9 g/dL (ref 6.5–8.1)

## 2024-07-27 LAB — URINALYSIS, ROUTINE W REFLEX MICROSCOPIC
Bilirubin Urine: NEGATIVE
Glucose, UA: NEGATIVE mg/dL
Hgb urine dipstick: NEGATIVE
Ketones, ur: NEGATIVE mg/dL
Leukocytes,Ua: NEGATIVE
Nitrite: NEGATIVE
Protein, ur: NEGATIVE mg/dL
Specific Gravity, Urine: 1.01 (ref 1.005–1.030)
pH: 7 (ref 5.0–8.0)

## 2024-07-27 LAB — CBC
HCT: 43.5 % (ref 36.0–46.0)
Hemoglobin: 14.9 g/dL (ref 12.0–15.0)
MCH: 29.8 pg (ref 26.0–34.0)
MCHC: 34.3 g/dL (ref 30.0–36.0)
MCV: 87 fL (ref 80.0–100.0)
Platelets: 241 K/uL (ref 150–400)
RBC: 5 MIL/uL (ref 3.87–5.11)
RDW: 12.8 % (ref 11.5–15.5)
WBC: 8.9 K/uL (ref 4.0–10.5)
nRBC: 0 % (ref 0.0–0.2)

## 2024-07-27 LAB — LIPASE, BLOOD: Lipase: 50 U/L (ref 11–51)

## 2024-07-27 LAB — PREGNANCY, URINE: Preg Test, Ur: NEGATIVE

## 2024-07-27 MED ORDER — SODIUM CHLORIDE 0.9 % IV BOLUS
1000.0000 mL | Freq: Once | INTRAVENOUS | Status: AC
  Administered 2024-07-27: 1000 mL via INTRAVENOUS

## 2024-07-27 MED ORDER — FENTANYL CITRATE PF 50 MCG/ML IJ SOSY
50.0000 ug | PREFILLED_SYRINGE | Freq: Once | INTRAMUSCULAR | Status: DC
  Filled 2024-07-27: qty 1

## 2024-07-27 MED ORDER — ONDANSETRON HCL 4 MG/2ML IJ SOLN
4.0000 mg | Freq: Once | INTRAMUSCULAR | Status: AC
  Administered 2024-07-27: 4 mg via INTRAVENOUS
  Filled 2024-07-27: qty 2

## 2024-07-27 MED ORDER — FENTANYL CITRATE PF 50 MCG/ML IJ SOSY
50.0000 ug | PREFILLED_SYRINGE | Freq: Once | INTRAMUSCULAR | Status: AC
  Administered 2024-07-27: 50 ug via INTRAVENOUS
  Filled 2024-07-27: qty 1

## 2024-07-27 MED ORDER — ONDANSETRON HCL 4 MG PO TABS: 4.0000 mg | ORAL_TABLET | Freq: Four times a day (QID) | ORAL | 0 refills | Status: DC

## 2024-07-27 MED ORDER — ONDANSETRON HCL 4 MG/2ML IJ SOLN
4.0000 mg | Freq: Once | INTRAMUSCULAR | Status: AC | PRN
  Administered 2024-07-27: 4 mg via INTRAVENOUS
  Filled 2024-07-27: qty 2

## 2024-07-27 MED ORDER — DROPERIDOL 2.5 MG/ML IJ SOLN
1.2500 mg | Freq: Once | INTRAMUSCULAR | Status: AC
  Administered 2024-07-27: 1.25 mg via INTRAVENOUS
  Filled 2024-07-27: qty 2

## 2024-07-27 MED ORDER — IOHEXOL 350 MG/ML SOLN
100.0000 mL | Freq: Once | INTRAVENOUS | Status: AC | PRN
  Administered 2024-07-27: 100 mL via INTRAVENOUS

## 2024-07-27 NOTE — ED Provider Notes (Addendum)
 6:12 AM Case d/w Dr. Pearline of vascular surgery with negative CTA, nothing additional to do at this time from a vascular perspective.         Mirca Yale, MD 07/27/24 [phone]

## 2024-07-27 NOTE — ED Provider Notes (Addendum)
 [place] [facility] Provider Note   CSN: [id number] Arrival date & time: 07/27/24  0301     Patient presents with: Abdominal Pain   Lindsey Macias is a 48 y.o. female.   The history is provided by the patient.  Abdominal Pain Pain location: upper abdomen  butterknife vertically right of umbilicus. Pain radiates to:  Does not radiate Pain severity:  Severe Onset quality:  Sudden Timing:  Constant Progression:  Unchanged Chronicity:  Recurrent Context: not recent travel, not suspicious food intake and not trauma   Relieved by:  Nothing Worsened by:  Nothing Ineffective treatments:  None tried Associated symptoms: nausea   Associated symptoms: no constipation, no dysuria, no fever and no vomiting   Risk factors: aspirin    Patient with a remote history of SMA dissection presents with recurrent pain.  Patient states she is a flare up of her pain.  States she called vascular surgery on call who directed her to the ED.  She states there are no words to describe the pain but it like a butter knife right of center vertically.  There is associated nausea, no diarrhea nor constipation. No fevers.  No urinary symptoms     Past Medical History:  Diagnosis Date   Allergy    Cancer (HCC) 2003   cervical cancer 2002 and 2003. had leep procedure.   GERD (gastroesophageal reflux disease)    but actually had h pylori and took meds then got better.   Varicose veins of both lower extremities      Prior to Admission medications   Medication Sig Start Date End Date Taking? Authorizing Provider  aspirin  EC 81 MG EC tablet Take 1 tablet (81 mg total) by mouth daily. 09/29/17   Rhyne, Samantha J, PA-C  azelastine  (OPTIVAR ) 0.05 % ophthalmic solution Place 1 drop into both eyes 2 (two) times daily. 03/03/24   Saguier, [place], PA-C  Azelastine  HCl 137 MCG/SPRAY SOLN SPRAY TWO SPRAYS IN EACH NOSTRIL TWICE DAILY 03/03/24   Saguier, [place], PA-C   eletriptan  (RELPAX ) 20 MG tablet Take 1 tablet (20 mg total) by mouth as needed for migraine or headache. May repeat in 2 hours if headache persists or recurs. 03/03/24   Saguier, [place], PA-C  esomeprazole  (NEXIUM ) 40 MG capsule 1 tab po q day 06/27/24   Saguier, [place], PA-C  fluconazole  (DIFLUCAN ) 150 MG tablet Take 1 tablet (150 mg total) by mouth daily. 02/11/24   Saguier, [place], PA-C  levonorgestrel  (MIRENA ) 20 MCG/24HR IUD 1 each by Intrauterine route once. 02/23/20   [provider]  mometasone  (NASONEX ) 50 MCG/ACT nasal spray 2 sprays twice daily 03/03/24   Saguier, [place], PA-C  Semaglutide -Weight Management 2.4 MG/0.75ML SOAJ Inject 2.4 mg into the skin once a week. Patient not taking: Reported on 07/05/2024 06/27/24   Saguier, [place], PA-C    Allergies: Vicodin [hydrocodone-acetaminophen ], Dust mite extract, Percocet [oxycodone -acetaminophen ], and Sulfa antibiotics    Review of Systems  Constitutional:  Negative for fever.  Gastrointestinal:  Positive for abdominal pain and nausea. Negative for constipation and vomiting.  Genitourinary:  Negative for dysuria.  All other systems reviewed and are negative.   Updated Vital Signs BP 117/76   Pulse 97   Temp 97.7 F (36.5 C) (Oral)   Resp 15   LMP  (LMP Unknown)   SpO2 96%   Physical Exam Vitals and nursing note reviewed.  Constitutional:      General: She is not in acute distress.  Appearance: Normal appearance. She is well-developed. She is not diaphoretic.  HENT:     Head: Normocephalic and atraumatic.     Nose: Nose normal.  Eyes:     Pupils: Pupils are equal, round, and reactive to light.  Cardiovascular:     Rate and Rhythm: Normal rate and regular rhythm.     Pulses: Normal pulses.     Heart sounds: Normal heart sounds.  Pulmonary:     Effort: Pulmonary effort is normal. No respiratory distress.     Breath sounds: Normal breath sounds.  Abdominal:     General: Abdomen is flat and scaphoid. Bowel  sounds are normal. There is no distension.     Palpations: Abdomen is soft.     Tenderness: There is no abdominal tenderness. There is no guarding or rebound.  Musculoskeletal:        General: Normal range of motion.     Cervical back: Neck supple.  Skin:    General: Skin is warm and dry.     Capillary Refill: Capillary refill takes less than 2 seconds.     Findings: No erythema or rash.  Neurological:     General: No focal deficit present.     Mental Status: She is alert and oriented to person, place, and time.     Deep Tendon Reflexes: Reflexes normal.  Psychiatric:        Mood and Affect: Mood normal.     (all labs ordered are listed, but only abnormal results are displayed) Results for orders placed or performed during the hospital encounter of 07/27/24  Comprehensive metabolic panel   Collection Time: 07/27/24  3:57 AM  Result Value Ref Range   Sodium 140 135 - 145 mmol/L   Potassium 3.6 3.5 - 5.1 mmol/L   Chloride 105 98 - 111 mmol/L   CO2 20 (L) 22 - 32 mmol/L   Glucose, Bld 122 (H) 70 - 99 mg/dL   BUN 11 6 - 20 mg/dL   Creatinine, Ser 8.86 (H) 0.44 - 1.00 mg/dL   Calcium 9.6 8.9 - 89.6 mg/dL   Total Protein 6.9 6.5 - 8.1 g/dL   Albumin 4.5 3.5 - 5.0 g/dL   AST 573 (H) 15 - 41 U/L   ALT 191 (H) 0 - 44 U/L   Alkaline Phosphatase 168 (H) 38 - 126 U/L   Total Bilirubin 1.3 (H) 0.0 - 1.2 mg/dL   GFR, Estimated 59 (L) >60 mL/min   Anion gap 14 5 - 15  CBC   Collection Time: 07/27/24  3:57 AM  Result Value Ref Range   WBC 8.9 4.0 - 10.5 K/uL   RBC 5.00 3.87 - 5.11 MIL/uL   Hemoglobin 14.9 12.0 - 15.0 g/dL   HCT 56.4 63.9 - 53.9 %   MCV 87.0 80.0 - 100.0 fL   MCH 29.8 26.0 - 34.0 pg   MCHC 34.3 30.0 - 36.0 g/dL   RDW 87.1 88.4 - 84.4 %   Platelets 241 150 - 400 K/uL   nRBC 0.0 0.0 - 0.2 %  Lipase, blood   Collection Time: 07/27/24  3:57 AM  Result Value Ref Range   Lipase 50 11 - 51 U/L  Urinalysis, Routine w reflex microscopic -Urine, Clean Catch    Collection Time: 07/27/24  5:23 AM  Result Value Ref Range   Color, Urine YELLOW YELLOW   APPearance CLEAR CLEAR   Specific Gravity, Urine 1.010 1.005 - 1.030   pH 7.0 5.0 - 8.0  Glucose, UA NEGATIVE NEGATIVE mg/dL   Hgb urine dipstick NEGATIVE NEGATIVE   Bilirubin Urine NEGATIVE NEGATIVE   Ketones, ur NEGATIVE NEGATIVE mg/dL   Protein, ur NEGATIVE NEGATIVE mg/dL   Nitrite NEGATIVE NEGATIVE   Leukocytes,Ua NEGATIVE NEGATIVE  Pregnancy, urine   Collection Time: 07/27/24  5:23 AM  Result Value Ref Range   Preg Test, Ur NEGATIVE NEGATIVE   CT ANGIO ABDOMEN PELVIS  W & WO CONTRAST Result Date: 07/27/2024 CLINICAL DATA:  48 year old female with chronic mesenteric ischemia. 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 Chronic back pain. History of treated cervical cancer. EXAM: CTA ABDOMEN AND PELVIS WITHOUT AND WITH CONTRAST TECHNIQUE: Multidetector CT imaging of the abdomen and pelvis was performed using the standard protocol during bolus administration of intravenous contrast. Multiplanar reconstructed images and MIPs were obtained and reviewed to evaluate the vascular anatomy. RADIATION DOSE REDUCTION: This exam was performed according to the departmental dose-optimization program which includes automated exposure control, adjustment of the mA and/or kV according to patient size and/or use of iterative reconstruction technique. CONTRAST:  75mL OMNIPAQUE  IOHEXOL  350 MG/ML SOLN COMPARISON:  CTA Abdomen and Pelvis 02/21/2020. FINDINGS: VASCULAR Normal caliber abdominal aorta. Negative for abdominal aortic aneurysm or dissection. Bilateral iliac and proximal femoral arteries appear patent and normal. Major aortic arterial branches remain patent. No branch dissection or thrombosis. Remodeling of the celiac artery origin as seen on series 602, image 97 is stable since 2021. Portal venous phase images also provided. Major portal  venous structures appear patent and within normal limits. Review of the MIP images confirms the above findings. NON-VASCULAR Lower chest: Lower lung volumes compared to 2021. Chronic lobulated right middle lobe oval lung nodule now up to 14 mm long axis (12 mm in 2021). See series 304, image 9. Stable lung bases otherwise. No pleural effusion. Heart size within normal limits. No pericardial effusion. Hepatobiliary: Negative liver and gallbladder. Pancreas: Negative. Spleen: Negative. Adrenals/Urinary Tract: Normal adrenal glands. Symmetric renal enhancement, nonobstructed kidneys. Diminutive ureters. Diminutive bladder. Chronic pelvic phleboliths. Stomach/Bowel: Nondilated large and small bowel loops throughout the abdomen and pelvis. Diminutive and normal retrocecal appendix on series 306, image 49. Decompressed terminal ileum. Diminutive stomach. No pneumoperitoneum, free fluid, or mesenteric inflammation identified. Lymphatic: No lymphadenopathy. Reproductive: Stable IUD, no adverse features. Other: No pelvis free fluid. Musculoskeletal: Progressed degenerative appearing L4-L5 endplate sclerosis and spurring lateral to the left, trace new vacuum disc there. Chronic posterior SI joint degenerative appearing sclerosis. Benign right superior pubic ramus bone island. No acute osseous abnormality identified. IMPRESSION: 1. Chronic right middle lobe Lung nodule, present on 2018 CT, but has increased 2 mm since 2021. Follow-up PET-CT would best characterize. Or referral to [facility]) recommended if not previously done. 2. Negative for abdominal aorta, or aortic branch vessel aneurysm or dissection. Negative for mesenteric thrombosis. Stable remodeling of the Celiac artery origin since 2021 CTA, previously suspected. Median Arcuate Ligament Compression 3. No acute or inflammatory process identified in the abdomen or pelvis. 4. Progressed L4-L5 disc and endplate degeneration since  2021. Electronically Signed   By: VEAR Hurst M.D.   On: 07/27/2024 05:04   CT Angio Abd/Pel W and/or Wo Contrast Result Date: 07/27/2024 CLINICAL DATA:  48 year old female with abdominal pain, nausea and diarrhea. 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 Chronic back pain.  History of treated cervical cancer. EXAM: CTA ABDOMEN  AND PELVIS WITHOUT AND WITH CONTRAST TECHNIQUE: Multidetector CT imaging of the abdomen and pelvis was performed using the standard protocol during bolus administration of intravenous contrast. Multiplanar reconstructed images and MIPs were obtained and reviewed to evaluate the vascular anatomy. RADIATION DOSE REDUCTION: This exam was performed according to the departmental dose-optimization program which includes automated exposure control, adjustment of the mA and/or kV according to patient size and/or use of iterative reconstruction technique. CONTRAST:  OMNIPAQUE  IOHEXOL  350 MG/ML SOLN COMPARISON:  CTA CT Abdomen and Pelvis 02/21/2020. FINDINGS: VASCULAR Normal caliber abdominal aorta. Negative for abdominal aortic aneurysm or dissection. Bilateral iliac and proximal femoral arteries appear patent and normal. Major abdominal arterial branches remain patent. Chronic remodeling of the celiac artery origin (series 602, image 112) appears stable since 2021. Median arcuate ligament compression previously suspected. Stable and patent celiac and SMA without dissection or thrombosis. Review of the MIP images confirms the above findings. NON-VASCULAR Lower chest: Lower lung volumes with symmetric lung base atelectasis. No pericardial or pleural effusion. Heart size appears normal. Hepatobiliary: Negative liver and gallbladder. Pancreas: Negative. Spleen: Negative. Adrenals/Urinary Tract: Normal adrenal glands. Kidneys, ureters, and bladder appear stable and unremarkable. Symmetric early renal contrast excretion.  Incidental pelvic phleboliths. Stomach/Bowel: Decompressed large and small bowel loops throughout much of the abdomen and pelvis. Diminutive and normal retrocecal appendix trace mesenteric nodularity near the tip of the appendix on series 301, image 82 is new since 2021, but appears inconsequential. Mild to moderate volume retained food in the stomach. Negative duodenum. No pneumoperitoneum, free fluid, active mesentery inflammation. Lymphatic: No lymphadenopathy. Reproductive: Stable IUD with no adverse features. Other: No pelvis free fluid. Musculoskeletal: Degenerative appearing left lateral lower lumbar endplate sclerosis, L4-L5 and adjacent vacuum disc there which is new since 2021. Similar degenerative sclerosis along the posterior SI joints appears more chronic. Benign bone island right superior pubic ramus. No acute or suspicious osseous lesion identified. IMPRESSION: 1. Negative for abdominal aortic aneurysm or dissection. Stable celiac and SMA since 2021 CTA, no acute or residual dissection. 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 2. No acute or inflammatory process identified in the abdomen or pelvis. 3. Lower lumbar disc and endplate degeneration since 2021. Electronically Signed   By: VEAR Hurst M.D.   On: 07/27/2024 04:55   VAS US  MESENTERIC Result Date: 07/05/2024 ABDOMINAL VISCERAL Patient Name:  ZELINA JIMERSON  Date of Exam:   07/05/2024 Medical Rec #: [id number]      Accession #:    [id number] Date of Birth: [birth year]     Patient Gender: F Patient Age:   1 years Exam Location:  [address] Procedure:      VAS US  MESENTERIC Referring Phys: [id number] PENNE BRUCKNER CAIN -------------------------------------------------------------------------------- Indications: History of SMA dissection High Risk Factors: Past history of smoking. 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 Performing Technologist: Devere Dark RVT  Examination Guidelines: A complete  evaluation includes B-mode imaging, spectral Doppler, color Doppler, and power Doppler as needed of all accessible portions of each vessel. Bilateral testing is considered an integral part of a complete examination. Limited examinations for reoccurring indications may be performed as noted.  Duplex Findings: +----------------------+--------+--------+------+--------+ Mesenteric            PSV cm/sEDV cm/sPlaqueComments +----------------------+--------+--------+------+--------+ Celiac Artery Origin    231                          +----------------------+--------+--------+------+--------+ Celiac Artery Proximal  408  199          curve   +----------------------+--------+--------+------+--------+ SMA Origin              156      39                  +----------------------+--------+--------+------+--------+ SMA Proximal            149      28                  +----------------------+--------+--------+------+--------+ SMA Mid                 180      39                  +----------------------+--------+--------+------+--------+ SMA Distal              141      25                  +----------------------+--------+--------+------+--------+    Summary: Mesenteric: Normal Superior Mesenteric artery findings. 70 to 99% stenosis in the celiac artery ( The celiac artery is noted to be tortuous).  *See table(s) above for measurements and observations.  Diagnosing physician: Debby Robertson  Electronically signed by Debby Robertson on 07/05/2024 at 12:37:43 PM.    Final       Radiology: CT Angio Abd/Pel W and/or Wo Contrast Result Date: 07/27/2024 CLINICAL DATA:  48 year old female with abdominal pain, nausea and diarrhea. 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 Chronic back pain.  History of treated cervical cancer. EXAM: CTA ABDOMEN AND PELVIS WITHOUT AND WITH CONTRAST TECHNIQUE: Multidetector CT imaging of  the abdomen and pelvis was performed using the standard protocol during bolus administration of intravenous contrast. Multiplanar reconstructed images and MIPs were obtained and reviewed to evaluate the vascular anatomy. RADIATION DOSE REDUCTION: This exam was performed according to the departmental dose-optimization program which includes automated exposure control, adjustment of the mA and/or kV according to patient size and/or use of iterative reconstruction technique. CONTRAST:  OMNIPAQUE  IOHEXOL  350 MG/ML SOLN COMPARISON:  CTA CT Abdomen and Pelvis 02/21/2020. FINDINGS: VASCULAR Normal caliber abdominal aorta. Negative for abdominal aortic aneurysm or dissection. Bilateral iliac and proximal femoral arteries appear patent and normal. Major abdominal arterial branches remain patent. Chronic remodeling of the celiac artery origin (series 602, image 112) appears stable since 2021. Median arcuate ligament compression previously suspected. Stable and patent celiac and SMA without dissection or thrombosis. Review of the MIP images confirms the above findings. NON-VASCULAR Lower chest: Lower lung volumes with symmetric lung base atelectasis. No pericardial or pleural effusion. Heart size appears normal. Hepatobiliary: Negative liver and gallbladder. Pancreas: Negative. Spleen: Negative. Adrenals/Urinary Tract: Normal adrenal glands. Kidneys, ureters, and bladder appear stable and unremarkable. Symmetric early renal contrast excretion. Incidental pelvic phleboliths. Stomach/Bowel: Decompressed large and small bowel loops throughout much of the abdomen and pelvis. Diminutive and normal retrocecal appendix trace mesenteric nodularity near the tip of the appendix on series 301, image 82 is new since 2021, but appears inconsequential. Mild to moderate volume retained food in the stomach. Negative duodenum. No pneumoperitoneum, free fluid, active mesentery inflammation. Lymphatic: No lymphadenopathy. Reproductive:  Stable IUD with no adverse features. Other: No pelvis free fluid. Musculoskeletal: Degenerative appearing left lateral lower lumbar endplate sclerosis, L4-L5 and adjacent vacuum disc there which is new since 2021. Similar degenerative sclerosis along the posterior SI joints  appears more chronic. Benign bone island right superior pubic ramus. No acute or suspicious osseous lesion identified. IMPRESSION: 1. Negative for abdominal aortic aneurysm or dissection. Stable celiac and SMA since 2021 CTA, no acute or residual dissection. 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 2. No acute or inflammatory process identified in the abdomen or pelvis. 3. Lower lumbar disc and endplate degeneration since 2021. Electronically Signed   By: VEAR Hurst M.D.   On: 07/27/2024 04:55     Procedures   Medications Ordered in the ED  ondansetron  (ZOFRAN ) injection 4 mg (has no administration in time range)  ondansetron  (ZOFRAN ) injection 4 mg (4 mg Intravenous Given 07/27/24 0417)  sodium chloride  0.9 % bolus 1,000 mL (0 mLs Intravenous Stopped 07/27/24 0539)  iohexol  (OMNIPAQUE ) 350 MG/ML injection 100 mL (100 mLs Intravenous Contrast Given 07/27/24 0435)  fentaNYL  (SUBLIMAZE ) injection 50 mcg (50 mcg Intravenous Given 07/27/24 0518)  droperidol  (INAPSINE ) 2.5 MG/ML injection 1.25 mg (1.25 mg Intravenous Given 07/27/24 0517)                                    Medical Decision Making Amount and/or Complexity of Data Reviewed External Data Reviewed: notes.    Details: Previous notes reviewed  Labs: ordered.    Details: Normal lipase 50,  Normal urine without UTI.  Negative pregnancy test.  Normal white count 8.9, normal hemoglobin 14.9, normal platelets.  Normal sodium 140, normal potassium 3.6, slight elevation of creatinine 1.13.  Slight elevation of bilirubin 1.3, elevated ALT 191 Elevated AST 426 (more than double ALT)  Radiology: ordered and independent interpretation performed.    Details: No SBO by me  on CT ECG/medicine tests: ordered and independent interpretation performed.    Details: QTc is normal   Risk Prescription drug management. Risk Details: No dissection on CTA of the abdomen.  Given upper abdominal pain and elevated LFTs will need a US  of the gall bladder to exclude cholecystitis.       Final diagnoses:  Pain of upper abdomen   Signed out to Dr. Neysa pending US  of the gall bladder     Alaijah Gibler, MD 07/27/24 9341

## 2024-07-27 NOTE — ED Provider Notes (Signed)
 Clinical Course as of 07/27/24 0856  Thu Jul 27, 2024  0702 Received signout; Dispo pending US  GB to evaluate LFT elevation. If US  negative, likely outpatient follow up.   [TY]  0831 US  Abdomen Limited IMPRESSION: 1. Cholelithiasis without sonographic evidence of acute cholecystitis. 2. Otherwise unremarkable exam.   Electronically Signed   By: Ree Molt M.D.   On: 07/27/2024 08:27   [TY]  9144 Updated patient on results.  Continues to feel improved.  Tolerating p.o.  Benign abdominal exam for me.  Will discharge with PCP follow-up and GI referral. [TY]    Clinical Course User Index [TY] Neysa Caron PARAS, DO      Neysa Caron PARAS, DO 07/27/24 1426

## 2024-07-27 NOTE — ED Notes (Signed)
 Introduced myself to pt and family member in room, room straightened and vitals done, await US 

## 2024-07-27 NOTE — Discharge Instructions (Signed)
 We are referring you to the gastroenterology doctors.  They should call to schedule an appointment, however if you do not hear from them in the next 48 hours.  Please call to schedule an appointment.  We are prescribing you nausea medications to take as needed.  Please return if no fevers, chills, severe pain, inability to eat or drink due to nausea vomiting, the whites of your eyes or skin becomes yellow or he develop any new or worsening symptoms that are concerning to you.

## 2024-07-27 NOTE — ED Notes (Signed)
 IV Removed

## 2024-07-27 NOTE — ED Triage Notes (Signed)
 Patient states she thinks she is having a flare up. Reports chronic back pain 10/10 and abdominal pain 8/10. Reports some nausea, diarrhea, no vomiting.

## 2024-07-31 ENCOUNTER — Ambulatory Visit (INDEPENDENT_AMBULATORY_CARE_PROVIDER_SITE_OTHER): Admitting: Medical

## 2024-07-31 ENCOUNTER — Encounter: Payer: Self-pay | Admitting: Medical

## 2024-07-31 VITALS — BP 120/78 | HR 62 | Temp 98.2°F | Resp 18 | Ht 63.0 in | Wt 158.0 lb

## 2024-07-31 DIAGNOSIS — R911 Solitary pulmonary nodule: Secondary | ICD-10-CM | POA: Diagnosis not present

## 2024-07-31 DIAGNOSIS — R1010 Upper abdominal pain, unspecified: Secondary | ICD-10-CM

## 2024-07-31 DIAGNOSIS — E669 Obesity, unspecified: Secondary | ICD-10-CM

## 2024-07-31 DIAGNOSIS — K8020 Calculus of gallbladder without cholecystitis without obstruction: Secondary | ICD-10-CM

## 2024-07-31 DIAGNOSIS — E538 Deficiency of other specified B group vitamins: Secondary | ICD-10-CM | POA: Diagnosis not present

## 2024-07-31 DIAGNOSIS — K589 Irritable bowel syndrome without diarrhea: Secondary | ICD-10-CM

## 2024-07-31 LAB — CBC WITH DIFFERENTIAL/PLATELET
Basophils Absolute: 0 K/uL (ref 0.0–0.1)
Basophils Relative: 0.5 % (ref 0.0–3.0)
Eosinophils Absolute: 0.1 K/uL (ref 0.0–0.7)
Eosinophils Relative: 1.7 % (ref 0.0–5.0)
HCT: 45 % (ref 36.0–46.0)
Hemoglobin: 15 g/dL (ref 12.0–15.0)
Lymphocytes Relative: 37.8 % (ref 12.0–46.0)
Lymphs Abs: 2.1 K/uL (ref 0.7–4.0)
MCHC: 33.3 g/dL (ref 30.0–36.0)
MCV: 90.3 fl (ref 78.0–100.0)
Monocytes Absolute: 0.4 K/uL (ref 0.1–1.0)
Monocytes Relative: 7.5 % (ref 3.0–12.0)
Neutro Abs: 2.9 K/uL (ref 1.4–7.7)
Neutrophils Relative %: 52.5 % (ref 43.0–77.0)
Platelets: 244 K/uL (ref 150.0–400.0)
RBC: 4.99 Mil/uL (ref 3.87–5.11)
RDW: 14 % (ref 11.5–15.5)
WBC: 5.4 K/uL (ref 4.0–10.5)

## 2024-07-31 LAB — COMPREHENSIVE METABOLIC PANEL WITH GFR
ALT: 165 U/L — ABNORMAL HIGH (ref 0–35)
AST: 61 U/L — ABNORMAL HIGH (ref 0–37)
Albumin: 4.6 g/dL (ref 3.5–5.2)
Alkaline Phosphatase: 159 U/L — ABNORMAL HIGH (ref 39–117)
BUN: 7 mg/dL (ref 6–23)
CO2: 24 meq/L (ref 19–32)
Calcium: 9.4 mg/dL (ref 8.4–10.5)
Chloride: 106 meq/L (ref 96–112)
Creatinine, Ser: 0.92 mg/dL (ref 0.40–1.20)
GFR: 72.99 mL/min (ref >60.00)
Glucose, Bld: 78 mg/dL (ref 70–99)
Potassium: 3.9 meq/L (ref 3.5–5.1)
Sodium: 142 meq/L (ref 135–145)
Total Bilirubin: 0.9 mg/dL (ref 0.2–1.2)
Total Protein: 6.7 g/dL (ref 6.0–8.3)

## 2024-07-31 LAB — VITAMIN B12: Vitamin B-12: 619 pg/mL (ref 211–911)

## 2024-07-31 MED ORDER — CYANOCOBALAMIN 1000 MCG/ML IJ SOLN
1000.0000 ug | Freq: Once | INTRAMUSCULAR | Status: AC
  Administered 2024-07-31: 1000 ug via INTRAMUSCULAR

## 2024-07-31 MED ORDER — AMOXICILLIN-POT CLAVULANATE 875-125 MG PO TABS: 1.0000 | ORAL_TABLET | Freq: Two times a day (BID) | ORAL | 0 refills | Status: DC

## 2024-07-31 NOTE — Patient Instructions (Addendum)
 Gallstones Intermittent abdominal pain possibly due to gallstones. Gallbladder not inflamed but stones present. - Refer to gastroenterologist for evaluation of gallstones. - Refer to general surgeon for potential cholecystectomy.  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 (recent abd pain) - No acute findings on recent imaging. 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 - Continue with scheduled vascular surgery consultation on 08-09-2024.  Irritable bowel syndrome (IBS) Chronic condition with dietary restrictions. Recent flare-up possibly related to SMA and celiac artery issues. - Manage with dietary modifications.  Elevated liver enzymes (AST, bilirubin) Recent labs show elevated AST and bilirubin. Possible liver involvement or secondary to gallstones or other abdominal issues. - Order repeat liver function tests to monitor AST and bilirubin levels.  Obesity, on hold GLP-1 agonist therapy Wegovy  therapy on hold due to potential side effects, including dehydration and gastroparesis. Not taken for 4-5 weeks. - Hold Wegovy  until evaluation by gastroenterologist.  Vitamin B12 deficiency Managed with monthly injections. Current status not specified. - Order blood work to assess current vitamin B12 levels. - Administer vitamin B12 injection if indicated by blood work results.  At end notes left ear piercing 2 days ago. In mid cartildge portion. Mild pink. No dc. -making augmentin  available if worse sign/symptoms of infection. If that occurs also recommend remove earings.  Follow up in 3-4 weeks or sooner if needed

## 2024-07-31 NOTE — Progress Notes (Signed)
 Subjective:    Patient ID: Lindsey Macias, female    DOB: 1976/12/14, 48 y.o.   MRN: [id number]  HPI   hu Jul 27, 2024  0702 Received signout; Dispo pending US  GB to evaluate LFT elevation. If US  negative, likely outpatient follow up.   [TY]  0831 US  Abdomen Limited IMPRESSION: 1. Cholelithiasis without sonographic evidence of acute cholecystitis. 2. Otherwise unremarkable exam.    6:12 AM Case d/w Dr. Pearline of vascular surgery with negative CTA, nothing additional to do at this time from a vascular perspective.     Arrival date & time: 07/27/24  0301     Patient presents with: Abdominal Pain     Lindsey Macias is a 49 y.o. female.     The history is provided by the patient.  Abdominal Pain Pain location: upper abdomen  butterknife vertically right of umbilicus. Pain radiates to:  Does not radiate Pain severity:  Severe Onset quality:  Sudden Timing:  Constant Progression:  Unchanged Chronicity:  Recurrent Context: not recent travel, not suspicious food intake and not trauma   Relieved by:  Nothing Worsened by:  Nothing Ineffective treatments:  None tried Associated symptoms: nausea   Associated symptoms: no constipation, no dysuria, no fever and no vomiting   Risk factors: aspirin    Patient with a remote history of SMA dissection presents with recurrent pain.  Patient states she is a flare up of her pain.  States she called vascular surgery on call who directed her to the ED.  She states there are no words to describe the pain but it like a butter knife right of center vertically.  There is associated nausea, no diarrhea nor constipation. No fevers.  No urinary symptoms   Medical Decision Making Amount and/or Complexity of Data Reviewed External Data Reviewed: notes.    Details: Previous notes reviewed  Labs: ordered.    Details: Normal lipase 50,  Normal urine without UTI.  Negative pregnancy test.  Normal white count 8.9, normal hemoglobin 14.9,  normal platelets.  Normal sodium 140, normal potassium 3.6, slight elevation of creatinine 1.13.  Slight elevation of bilirubin 1.3, elevated ALT 191 Elevated AST 426 (more than double ALT)  Radiology: ordered and independent interpretation performed.    Details: No SBO by me on CT ECG/medicine tests: ordered and independent interpretation performed.    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  She experienced a flare-up of abdomen pain  in June, which she attributes to excessive exercise. The pain is described as a tight sensation, like a rubber band, located to the right of her umbilicus and wrapping around her side. During this flare-up, she was unable to eat solid food for thirteen days, relying on protein shakes and collagen for nutrition. Eating exacerbates her symptoms, which is typical during SMA and celiac flares.(Pt perception)  She has a history of SMA dissection and during a recent episode, she experienced severe dehydration, requiring two bags of IV fluids at the emergency department. She was unable to urinate adequately after the first bag of fluids, prompting a second bag and a bladder scan that showed an empty bladder.  Seen just recently 4 days ago in ED as well.  Her diet is restricted due to IBS and SMA, avoiding heavy meals and focusing on foods like chicken, rice, salmon,  and fresh fruits. She has difficulty staying hydrated and has experienced increased nausea and fatigue since the last episode.  She was previously on Wegovy  but stopped taking it about four to five weeks ago due to concerns about its impact on her hydration status. She has not resumed it since then.  She describes an episode of pain during a walk with her niece, noting tightness in the same area as her celiac pain. She also experienced upper back pain, which she associates with  previous blood clot pain related to her SMA.  She has a history of gallstones, with two identified. The pain she experienced was similar to previous episodes related to her SMA.  No current nausea, vomiting, or abdominal pain and she is able to eat healthily.  Since ED visit she has no abdomen pain, no nausea and no vomiting. No back pain.  Review of Systems  Constitutional:  Negative for chills, fatigue and fever.  HENT:  Negative for congestion and ear pain.   Respiratory:  Negative for cough, chest tightness, shortness of breath and wheezing.   Cardiovascular:  Negative for chest pain and palpitations.  Gastrointestinal:  Negative for abdominal pain, constipation, diarrhea, nausea and rectal pain.  Genitourinary:  Negative for dysuria.  Musculoskeletal:  Negative for back pain and neck pain.  Skin:  Negative for rash.  Neurological:  Negative for dizziness and light-headedness.  Hematological:  Negative for adenopathy.  Psychiatric/Behavioral:  Negative for behavioral problems and dysphoric mood.     Past Medical History:  Diagnosis Date   Allergy    Cancer (HCC) 2003   cervical cancer 2002 and 2003. had leep procedure.   GERD (gastroesophageal reflux disease)    but actually had h pylori and took meds then got better.   Varicose veins of both lower extremities      Social History   Socioeconomic History   Marital status: Divorced    Spouse name: Not on file   Number of children: Not on file   Years of education: Not on file   Highest education level: Not on file  Occupational History   Not on file  Tobacco Use   Smoking status: Former    Current packs/day: 0.00    Types: Cigarettes    Quit date: 11/23/1994    Years since quitting: 29.7   Smokeless tobacco: Never  Vaping Use   Vaping status: Never Used  Substance and Sexual Activity   Alcohol use: Yes    Comment: Pt states she drinks 2-3 drinks per year.   Drug use: No   Sexual activity: Not Currently     Comment: intercourse age 33, more than 5 sexual partners  Other Topics Concern   Not on file  Social History Narrative   Not on file   Social Drivers of Health   Financial Resource Strain: Not on file  Food Insecurity: Not on file  Transportation Needs: Not on file  Physical Activity: Not on file  Stress: Not on file  Social Connections: Not on file  Intimate Partner Violence: Not on file    Past Surgical History:  Procedure Laterality Date   Bunions surgery Right 2010 and 2012   ENDOVENOUS ABLATION SAPHENOUS VEIN W/ LASER Right 08/02/2019   endovenous laser ablation right greater saphenous vein by Carlin Haddock MD    SHOULDER SURGERY Right 2012   TONSILLECTOMY Bilateral 2012    Family History  Problem Relation Age of Onset   Cancer Father  Brain   Diabetes Maternal Grandmother     Allergies  Allergen Reactions   Vicodin [Hydrocodone-Acetaminophen ] Photosensitivity   Dust Mite Extract     Sneezing,Watery eyes,Runny nose   Percocet [Oxycodone -Acetaminophen ]    Sulfa Antibiotics     Upset stomach    Current Outpatient Medications on File Prior to Visit  Medication Sig Dispense Refill   aspirin  EC 81 MG EC tablet Take 1 tablet (81 mg total) by mouth daily.     azelastine  (OPTIVAR ) 0.05 % ophthalmic solution Place 1 drop into both eyes 2 (two) times daily. 6 mL 11   Azelastine  HCl 137 MCG/SPRAY SOLN SPRAY TWO SPRAYS IN EACH NOSTRIL TWICE DAILY 90 mL 2   eletriptan  (RELPAX ) 20 MG tablet Take 1 tablet (20 mg total) by mouth as needed for migraine or headache. May repeat in 2 hours if headache persists or recurs. 10 tablet 3   esomeprazole  (NEXIUM ) 40 MG capsule 1 tab po q day 90 capsule 0   fluconazole  (DIFLUCAN ) 150 MG tablet Take 1 tablet (150 mg total) by mouth daily. 1 tablet 1   levonorgestrel  (MIRENA ) 20 MCG/24HR IUD 1 each by Intrauterine route once.     mometasone  (NASONEX ) 50 MCG/ACT nasal spray 2 sprays twice daily 12 each 11   ondansetron  (ZOFRAN ) 4 MG  tablet Take 1 tablet (4 mg total) by mouth every 6 (six) hours. 12 tablet 0   Semaglutide -Weight Management 2.4 MG/0.75ML SOAJ Inject 2.4 mg into the skin once a week. (Patient not taking: Reported on 07/05/2024) 3 mL 0   No current facility-administered medications on file prior to visit.    BP 120/78   Pulse 62   Temp 98.2 F (36.8 C)   Resp 18   Ht 5' 3 (1.6 m)   Wt 158 lb (71.7 kg)   LMP  (LMP Unknown)   SpO2 98%   BMI 27.99 kg/m        Objective:   Physical Exam  General Mental Status- Alert. General Appearance- Not in acute distress.   Skin General: Color- Normal Color. Moisture- Normal Moisture.  Neck No JVD.  Chest and Lung Exam Auscultation: Breath Sounds:-CTA  Cardiovascular Auscultation:Rythm- RRR Murmurs & Other Heart Sounds:Auscultation of the heart reveals- No Murmurs.  Abdomen Inspection:-Inspeection Normal. Palpation/Percussion:Note:No mass. Palpation and Percussion of the abdomen reveal- Non Tender, Non Distended + BS, no rebound or guarding.  Neurologic Cranial Nerve exam:- CN III-XII intact(No nystagmus), symmetric smile. Strength:- 5/5 equal and symmetric strength both upper and lower extremities.   Back- no cva tenderness.     Assessment & Plan:   Patient Instructions  Gallstones Intermittent abdominal pain possibly due to gallstones. Gallbladder not inflamed but stones present. - Refer to gastroenterologist for evaluation of gallstones. - Refer to general surgeon for potential cholecystectomy.  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 (recent abd pain) - No acute findings on recent imaging. 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 - Continue with scheduled vascular surgery consultation on 08-09-2024.  Irritable bowel syndrome (IBS) Chronic condition with dietary  restrictions. Recent flare-up possibly related to SMA and celiac artery issues. - Manage with dietary modifications.  Elevated liver enzymes (AST, bilirubin) Recent labs show elevated AST and bilirubin. Possible liver involvement or secondary to gallstones or other abdominal issues. - Order repeat liver function tests to monitor AST and bilirubin levels.  Obesity, on hold GLP-1 agonist therapy Wegovy   therapy on hold due to potential side effects, including dehydration and gastroparesis. Not taken for 4-5 weeks. - Hold Wegovy  until evaluation by gastroenterologist.  Vitamin B12 deficiency Managed with monthly injections. Current status not specified. - Order blood work to assess current vitamin B12 levels. - Administer vitamin B12 injection if indicated by blood work results.  Follow up in 3-4 weeks or sooner if needed   [employer], PA-C

## 2024-08-01 ENCOUNTER — Ambulatory Visit: Payer: Self-pay | Admitting: Medical

## 2024-08-04 DIAGNOSIS — K8020 Calculus of gallbladder without cholecystitis without obstruction: Secondary | ICD-10-CM | POA: Diagnosis not present

## 2024-08-04 DIAGNOSIS — I999 Unspecified disorder of circulatory system: Secondary | ICD-10-CM | POA: Diagnosis not present

## 2024-08-07 ENCOUNTER — Telehealth: Payer: Self-pay | Admitting: Medical

## 2024-08-07 ENCOUNTER — Ambulatory Visit: Payer: Self-pay | Admitting: General Surgery

## 2024-08-07 DIAGNOSIS — K8020 Calculus of gallbladder without cholecystitis without obstruction: Secondary | ICD-10-CM

## 2024-08-07 MED ORDER — TRAMADOL HCL 50 MG PO TABS: 50.0000 mg | ORAL_TABLET | Freq: Four times a day (QID) | ORAL | 0 refills | Status: AC | PRN

## 2024-08-07 NOTE — Telephone Encounter (Signed)
 Sarah,  I just put in referral to pulmonology for  the below in . Was wondering could this pt be seen quickly? Also was considering going ahead and ordering Pet-Ct. Various options in epic. Expect prior shara will be needed and if start that process wanted to place correct order. What would that order be in epic. Which choice?Correct order?  Recent in ED for abdomen pain  CT abdomen and pelvis showed Chronic right middle lobe Lung nodule, present on 2018 CT, but has increased 2 mm since 2021. Follow-up PET-CT would best characterize. Or referral to Multi-Disciplinary Thoracic Oncology. Wanting to know if pulmonlogy could see pt quickly to expidite work up.  Thanks for your help. Brendyn Mclaren, PA-C

## 2024-08-07 NOTE — Telephone Encounter (Signed)
 Talked with pt about her ibuprofen and tylenol (ruq pain and has known gallstones). Surgery this coming Thursday.  Taking 600-800 mg ibuprofen  every 8 hours. Also tylenol  500 mg every 8 hours. Pain level is 4-6/10 even with medication. Frustrating for pt . No fever,no chills, no nausea or vomiting. No yellow color to eyes on review. Will rx tramadol  limited. If pain worsen or change prior to surgery on Thursday let me know.  Addressed above but called originally to discuss my effort to get in with pulmonologist or oncologist to evaluate pulmonary nodule. I also explained trying to get correct order for pet ct lung nodule from NP in pulmonologist as expect will need prior auth. Will up date pt when get update from specialsit.

## 2024-08-07 NOTE — Addendum Note (Signed)
 Addended by: DORINA DALLAS HERO on: 08/07/2024 04:58 PM   Modules accepted: Orders

## 2024-08-08 ENCOUNTER — Encounter: Payer: Self-pay | Admitting: *Deleted

## 2024-08-08 ENCOUNTER — Other Ambulatory Visit: Payer: Self-pay

## 2024-08-08 ENCOUNTER — Encounter (HOSPITAL_COMMUNITY): Payer: Self-pay

## 2024-08-08 ENCOUNTER — Telehealth: Payer: Self-pay

## 2024-08-08 ENCOUNTER — Emergency Department (HOSPITAL_COMMUNITY)
Admission: EM | Admit: 2024-08-08 | Discharge: 2024-08-09 | Discharge status: Against Medical Advice | Attending: Emergency Medicine | Admitting: Emergency Medicine

## 2024-08-08 DIAGNOSIS — M545 Low back pain, unspecified: Secondary | ICD-10-CM | POA: Diagnosis not present

## 2024-08-08 DIAGNOSIS — R1110 Vomiting, unspecified: Secondary | ICD-10-CM | POA: Diagnosis not present

## 2024-08-08 DIAGNOSIS — Z5321 Procedure and treatment not carried out due to patient leaving prior to being seen by health care provider: Secondary | ICD-10-CM | POA: Insufficient documentation

## 2024-08-08 DIAGNOSIS — R911 Solitary pulmonary nodule: Secondary | ICD-10-CM

## 2024-08-08 DIAGNOSIS — R109 Unspecified abdominal pain: Secondary | ICD-10-CM | POA: Insufficient documentation

## 2024-08-08 DIAGNOSIS — R82998 Other abnormal findings in urine: Secondary | ICD-10-CM | POA: Diagnosis not present

## 2024-08-08 LAB — URINALYSIS, ROUTINE W REFLEX MICROSCOPIC
Glucose, UA: NEGATIVE mg/dL
Hgb urine dipstick: NEGATIVE
Ketones, ur: 20 mg/dL — AB
Leukocytes,Ua: NEGATIVE
Nitrite: NEGATIVE
Protein, ur: 30 mg/dL — AB
Specific Gravity, Urine: 1.029 (ref 1.005–1.030)
pH: 5 (ref 5.0–8.0)

## 2024-08-08 LAB — CBC
HCT: 48.3 % — ABNORMAL HIGH (ref 36.0–46.0)
Hemoglobin: 15.5 g/dL — ABNORMAL HIGH (ref 12.0–15.0)
MCH: 30.4 pg (ref 26.0–34.0)
MCHC: 32.1 g/dL (ref 30.0–36.0)
MCV: 94.7 fL (ref 80.0–100.0)
Platelets: 254 K/uL (ref 150–400)
RBC: 5.1 MIL/uL (ref 3.87–5.11)
RDW: 12.9 % (ref 11.5–15.5)
WBC: 9.3 K/uL (ref 4.0–10.5)
nRBC: 0 % (ref 0.0–0.2)

## 2024-08-08 LAB — BASIC METABOLIC PANEL WITH GFR
Anion gap: 14 (ref 5–15)
BUN: 9 mg/dL (ref 6–20)
CO2: 21 mmol/L — ABNORMAL LOW (ref 22–32)
Calcium: 9.6 mg/dL (ref 8.9–10.3)
Chloride: 102 mmol/L (ref 98–111)
Creatinine, Ser: 0.94 mg/dL (ref 0.44–1.00)
GFR, Estimated: >60 mL/min (ref >60)
Glucose, Bld: 92 mg/dL (ref 70–99)
Potassium: 3.7 mmol/L (ref 3.5–5.1)
Sodium: 137 mmol/L (ref 135–145)

## 2024-08-08 NOTE — ED Notes (Addendum)
 Patient wanted to let staff know she is vomiting green fluids. Triage RN notified.

## 2024-08-08 NOTE — Progress Notes (Signed)
 Received referral for increasing pulmonary nodule. Referral also sent to pulmonary. Spoke with that office. Patient is scheduled to see Dr Shelah on 08/31/2024 (patient was offered sooner appointment). They request that order for PET be placed and scheduled for prior to that time for review. We will schedule patient with this office once biopsy obtained.   Called patient and notified her of PET order. She will call centralized scheduling and schedule her PET scan. She does state that she is in no hurry to get all these appointments scheduled as she has surgery this week as well. She will schedule the PET today and may further delay the pulmonary appointment. She does plan to make all follow up appointments, just not in an expedited fashion.   Oncology Nurse Navigator Documentation     08/08/2024   10:45 AM  Oncology Nurse Navigator Flowsheets  Abnormal Finding Date 07/27/2024  Diagnosis Status Additional Work Up  Navigator Follow Up Date: 08/31/2024  Navigator Follow Up Reason: Scan Review  Navigator Location [facility]  Referral Date to RadOnc/MedOnc 08/07/2024  Navigator Encounter Type Appt/Treatment Plan Review;Introductory Phone Call  Patient Visit Type MedOnc  Treatment Phase Abnormal Scans  Barriers/Navigation Needs Coordination of Care;Education  Interventions Coordination of Care;Education  Acuity Level 2-Minimal Needs (1-2 Barriers Identified)  Coordination of Care Radiology  Education Method Verbal  Time Spent with Patient 45

## 2024-08-08 NOTE — ED Triage Notes (Signed)
 Patient states she has green vomit with flecks in it, dark [place] urine, darker than tea with lower back pain, scheduled to have gallbladder removed Thursday.

## 2024-08-08 NOTE — Telephone Encounter (Signed)
 Called patient to schedule appointment with Dr.Byrum per referral.Appointment placed.NFN

## 2024-08-09 ENCOUNTER — Encounter: Payer: Self-pay | Admitting: Medical

## 2024-08-09 ENCOUNTER — Ambulatory Visit: Admitting: Vascular Surgery

## 2024-08-09 ENCOUNTER — Other Ambulatory Visit: Payer: Self-pay

## 2024-08-09 ENCOUNTER — Encounter (HOSPITAL_COMMUNITY): Payer: Self-pay | Admitting: General Surgery

## 2024-08-09 LAB — HEPATIC FUNCTION PANEL
ALT: 183 U/L — ABNORMAL HIGH (ref 0–44)
AST: 110 U/L — ABNORMAL HIGH (ref 15–41)
Albumin: 4.2 g/dL (ref 3.5–5.0)
Alkaline Phosphatase: 174 U/L — ABNORMAL HIGH (ref 38–126)
Bilirubin, Direct: 1.4 mg/dL — ABNORMAL HIGH (ref 0.0–0.2)
Indirect Bilirubin: 1.6 mg/dL — ABNORMAL HIGH (ref 0.3–0.9)
Total Bilirubin: 3 mg/dL — ABNORMAL HIGH (ref 0.0–1.2)
Total Protein: 6.9 g/dL (ref 6.5–8.1)

## 2024-08-09 MED ORDER — ONDANSETRON 4 MG PO TBDP
4.0000 mg | ORAL_TABLET | Freq: Once | ORAL | Status: AC | PRN
  Administered 2024-08-09: 4 mg via ORAL
  Filled 2024-08-09: qty 1

## 2024-08-09 NOTE — ED Notes (Signed)
 Patient is deciding to leave. States her reason for leaving is the excessive wait time. Patient would like a line item bill.

## 2024-08-09 NOTE — ED Notes (Signed)
 Patient is requesting more zofran . Stated I could not give her anymore zofran  at time due to it being to soon. Patient then stated she wanted to know how much longer it would be and stated surely they think my urine is emergent enough to go back. I advised as the nurse I could no give her the results of hr urine, but she may be able to see the results in her MyChart.

## 2024-08-09 NOTE — ED Notes (Addendum)
 Pt requesting to have results post in her MyChart. Pt informed that we do not control MyChart and her results should populate within 24-48 hours. Pt then requested medication for nausea. RN made aware.

## 2024-08-09 NOTE — Progress Notes (Signed)
 SDW call  Patient was given pre-op instructions over the phone. Patient verbalized understanding of instructions provided.     PCP - Dallas Maxwell, PA-C Cardiologist - Dr. Sheree, for vascular in legs Pulmonary:    PPM/ICD - denies Device Orders - na Rep Notified - na   Chest x-ray - na EKG -  07/27/2024 Stress Test - ECHO -  Cardiac Cath -   Sleep Study/sleep apnea/CPAP: denies  Non-diabetic  GLP1: Wegovy , states last does 07/28/2024   Blood Thinner Instructions: denies Aspirin  Instructions:states last dose 08/04/2024   ERAS Protcol - Clears until 0430  Anesthesia review: No   Patient denies shortness of breath, fever, cough and chest pain over the phone call  Your procedure is scheduled on Thursday August 10, 2024  Report to [facility] Main Entrance A at  0530  A.M., then check in with the Admitting office.  Call this number if you have problems the morning of surgery:  [phone]   If you have any questions prior to your surgery date call [phone]: Open Monday-Friday 8am-4pm If you experience any cold or flu symptoms such as cough, fever, chills, shortness of breath, etc. between now and your scheduled surgery, please notify us  at the above number     Remember:  Do not eat after midnight the night before your surgery  You may drink clear liquids until  0430   the morning of your surgery.   Clear liquids allowed are: Water, Non-Citrus Juices (without pulp), Carbonated Beverages, Clear Tea, Black Coffee ONLY (NO MILK, CREAM OR POWDERED CREAMER of any kind), and Gatorade   Take these medicines the morning of surgery with A SIP OF WATER:  Optivar  gropps, astelin ,nexium  nasonex   As needed: Relpax , tramadol   As of today, STOP taking any Aspirin  (unless otherwise instructed by your surgeon) Aleve, Naproxen, Ibuprofen, Motrin, Advil, Goody's, BC's, all herbal medications, fish oil, and all vitamins.

## 2024-08-09 NOTE — Anesthesia Preprocedure Evaluation (Signed)
 Anesthesia Evaluation  Patient identified by MRN, date of birth, ID band Patient awake    Reviewed: Allergy & Precautions, NPO status , Patient's Chart, lab work & pertinent test results  History of Anesthesia Complications (+) PONV and history of anesthetic complications  Airway Mallampati: II  TM Distance: >3 FB Neck ROM: Full    Dental no notable dental hx. (+) Teeth Intact, Dental Advisory Given   Pulmonary former smoker   Pulmonary exam normal breath sounds clear to auscultation       Cardiovascular + Peripheral Vascular Disease (History of Superior mesenteric artery (SMA) dissection)  Normal cardiovascular exam Rhythm:Regular Rate:Normal     Neuro/Psych negative neurological ROS  negative psych ROS   GI/Hepatic Neg liver ROS,GERD  ,,  Endo/Other  negative endocrine ROS    Renal/GU negative Renal ROS  negative genitourinary   Musculoskeletal negative musculoskeletal ROS (+)    Abdominal   Peds  Hematology negative hematology ROS (+)   Anesthesia Other Findings   Reproductive/Obstetrics                              Anesthesia Physical Anesthesia Plan  ASA: 2  Anesthesia Plan: General   Post-op Pain Management: Tylenol  PO (pre-op)*   Induction: Intravenous  PONV Risk Score and Plan: 4 or greater and Midazolam , Dexamethasone  and Ondansetron   Airway Management Planned: Oral ETT  Additional Equipment:   Intra-op Plan:   Post-operative Plan: Extubation in OR  Informed Consent: I have reviewed the patients History and Physical, chart, labs and discussed the procedure including the risks, benefits and alternatives for the proposed anesthesia with the patient or authorized representative who has indicated his/her understanding and acceptance.     Dental advisory given  Plan Discussed with: CRNA  Anesthesia Plan Comments:          Anesthesia Quick Evaluation

## 2024-08-10 ENCOUNTER — Other Ambulatory Visit: Payer: Self-pay

## 2024-08-10 ENCOUNTER — Ambulatory Visit (HOSPITAL_COMMUNITY): Payer: Self-pay | Admitting: Anesthesiology

## 2024-08-10 ENCOUNTER — Ambulatory Visit (HOSPITAL_COMMUNITY)

## 2024-08-10 ENCOUNTER — Encounter (HOSPITAL_COMMUNITY)
Admission: RE | Disposition: A | Discharge status: Home / Self Care | Payer: Self-pay | Source: Home / Self Care | Attending: General Surgery

## 2024-08-10 ENCOUNTER — Encounter (HOSPITAL_COMMUNITY): Payer: Self-pay | Admitting: General Surgery

## 2024-08-10 ENCOUNTER — Observation Stay (HOSPITAL_COMMUNITY)
Admission: RE | Admit: 2024-08-10 | Discharge: 2024-08-11 | Disposition: A | Discharge status: Home / Self Care | Attending: General Surgery | Admitting: General Surgery

## 2024-08-10 DIAGNOSIS — K8010 Calculus of gallbladder with chronic cholecystitis without obstruction: Secondary | ICD-10-CM | POA: Diagnosis not present

## 2024-08-10 DIAGNOSIS — Z8505 Personal history of malignant neoplasm of liver: Secondary | ICD-10-CM | POA: Diagnosis not present

## 2024-08-10 DIAGNOSIS — K8020 Calculus of gallbladder without cholecystitis without obstruction: Secondary | ICD-10-CM | POA: Diagnosis not present

## 2024-08-10 DIAGNOSIS — R7989 Other specified abnormal findings of blood chemistry: Secondary | ICD-10-CM | POA: Insufficient documentation

## 2024-08-10 DIAGNOSIS — K819 Cholecystitis, unspecified: Secondary | ICD-10-CM | POA: Diagnosis not present

## 2024-08-10 DIAGNOSIS — I739 Peripheral vascular disease, unspecified: Secondary | ICD-10-CM | POA: Diagnosis not present

## 2024-08-10 DIAGNOSIS — Z87891 Personal history of nicotine dependence: Secondary | ICD-10-CM | POA: Diagnosis not present

## 2024-08-10 DIAGNOSIS — Z9049 Acquired absence of other specified parts of digestive tract: Principal | ICD-10-CM

## 2024-08-10 DIAGNOSIS — Z7982 Long term (current) use of aspirin: Secondary | ICD-10-CM | POA: Insufficient documentation

## 2024-08-10 HISTORY — DX: Other specified postprocedural states: R11.2

## 2024-08-10 HISTORY — DX: Other specified postprocedural states: Z98.890

## 2024-08-10 HISTORY — PX: CHOLECYSTECTOMY: SHX55

## 2024-08-10 HISTORY — DX: Other complications of anesthesia, initial encounter: T88.59XA

## 2024-08-10 LAB — COMPREHENSIVE METABOLIC PANEL WITH GFR
ALT: 139 U/L — ABNORMAL HIGH (ref 0–44)
AST: 83 U/L — ABNORMAL HIGH (ref 15–41)
Albumin: 3.9 g/dL (ref 3.5–5.0)
Alkaline Phosphatase: 175 U/L — ABNORMAL HIGH (ref 38–126)
Anion gap: 13 (ref 5–15)
BUN: 11 mg/dL (ref 6–20)
CO2: 21 mmol/L — ABNORMAL LOW (ref 22–32)
Calcium: 9.5 mg/dL (ref 8.9–10.3)
Chloride: 102 mmol/L (ref 98–111)
Creatinine, Ser: 1.16 mg/dL — ABNORMAL HIGH (ref 0.44–1.00)
GFR, Estimated: 58 mL/min — ABNORMAL LOW (ref >60)
Glucose, Bld: 83 mg/dL (ref 70–99)
Potassium: 3.3 mmol/L — ABNORMAL LOW (ref 3.5–5.1)
Sodium: 136 mmol/L (ref 135–145)
Total Bilirubin: 2.3 mg/dL — ABNORMAL HIGH (ref 0.0–1.2)
Total Protein: 6.8 g/dL (ref 6.5–8.1)

## 2024-08-10 LAB — POCT PREGNANCY, URINE: Preg Test, Ur: NEGATIVE

## 2024-08-10 SURGERY — LAPAROSCOPIC CHOLECYSTECTOMY WITH INTRAOPERATIVE CHOLANGIOGRAM
Anesthesia: General

## 2024-08-10 MED ORDER — CHLORHEXIDINE GLUCONATE CLOTH 2 % EX PADS
6.0000 | MEDICATED_PAD | Freq: Once | CUTANEOUS | Status: AC
  Administered 2024-08-10: 6 via TOPICAL

## 2024-08-10 MED ORDER — MIDAZOLAM HCL 2 MG/2ML IJ SOLN
INTRAMUSCULAR | Status: AC
  Filled 2024-08-10: qty 2

## 2024-08-10 MED ORDER — CELECOXIB 200 MG PO CAPS
400.0000 mg | ORAL_CAPSULE | ORAL | Status: AC
  Administered 2024-08-10: 400 mg via ORAL
  Filled 2024-08-10: qty 2

## 2024-08-10 MED ORDER — MORPHINE SULFATE (PF) 4 MG/ML IV SOLN
4.0000 mg | INTRAVENOUS | Status: DC | PRN
  Filled 2024-08-10: qty 1

## 2024-08-10 MED ORDER — ROCURONIUM BROMIDE 10 MG/ML (PF) SYRINGE
PREFILLED_SYRINGE | INTRAVENOUS | Status: AC
  Filled 2024-08-10: qty 10

## 2024-08-10 MED ORDER — PANTOPRAZOLE SODIUM 40 MG PO TBEC
40.0000 mg | DELAYED_RELEASE_TABLET | Freq: Every day | ORAL | Status: DC
  Filled 2024-08-10: qty 1

## 2024-08-10 MED ORDER — CHLORHEXIDINE GLUCONATE 0.12 % MT SOLN
OROMUCOSAL | Status: AC
  Administered 2024-08-10: 15 mL via OROMUCOSAL
  Filled 2024-08-10: qty 15

## 2024-08-10 MED ORDER — HYDROMORPHONE HCL 1 MG/ML IJ SOLN
INTRAMUSCULAR | Status: DC | PRN
  Administered 2024-08-10: .5 mg via INTRAVENOUS

## 2024-08-10 MED ORDER — LIDOCAINE 2% (20 MG/ML) 5 ML SYRINGE
INTRAMUSCULAR | Status: DC | PRN
  Administered 2024-08-10: 60 mg via INTRAVENOUS

## 2024-08-10 MED ORDER — MIDAZOLAM HCL 2 MG/2ML IJ SOLN
INTRAMUSCULAR | Status: DC | PRN
  Administered 2024-08-10: 2 mg via INTRAVENOUS

## 2024-08-10 MED ORDER — FENTANYL CITRATE (PF) 250 MCG/5ML IJ SOLN
INTRAMUSCULAR | Status: AC
  Filled 2024-08-10: qty 5

## 2024-08-10 MED ORDER — BUPIVACAINE-EPINEPHRINE 0.25% -1:200000 IJ SOLN
INTRAMUSCULAR | Status: DC | PRN
  Administered 2024-08-10: 26 mL

## 2024-08-10 MED ORDER — CHLORHEXIDINE GLUCONATE CLOTH 2 % EX PADS: 6.0000 | MEDICATED_PAD | Freq: Once | CUTANEOUS | Status: DC

## 2024-08-10 MED ORDER — BUPIVACAINE-EPINEPHRINE (PF) 0.25% -1:200000 IJ SOLN
INTRAMUSCULAR | Status: AC
Start: 2024-08-10 — End: 2024-08-10
  Filled 2024-08-10: qty 30

## 2024-08-10 MED ORDER — TRAMADOL HCL 50 MG PO TABS
50.0000 mg | ORAL_TABLET | Freq: Four times a day (QID) | ORAL | Status: DC | PRN
  Administered 2024-08-10 (×2): 50 mg via ORAL
  Filled 2024-08-10 (×2): qty 1

## 2024-08-10 MED ORDER — ROCURONIUM BROMIDE 10 MG/ML (PF) SYRINGE
PREFILLED_SYRINGE | INTRAVENOUS | Status: DC | PRN
  Administered 2024-08-10: 50 mg via INTRAVENOUS

## 2024-08-10 MED ORDER — LIDOCAINE 2% (20 MG/ML) 5 ML SYRINGE
INTRAMUSCULAR | Status: AC
  Filled 2024-08-10: qty 5

## 2024-08-10 MED ORDER — ACETAMINOPHEN 10 MG/ML IV SOLN
1000.0000 mg | Freq: Once | INTRAVENOUS | Status: AC
  Administered 2024-08-10: 1000 mg via INTRAVENOUS

## 2024-08-10 MED ORDER — POTASSIUM CHLORIDE IN NACL 20-0.9 MEQ/L-% IV SOLN
INTRAVENOUS | Status: DC
  Filled 2024-08-10: qty 1000

## 2024-08-10 MED ORDER — FENTANYL CITRATE (PF) 250 MCG/5ML IJ SOLN
INTRAMUSCULAR | Status: DC | PRN
  Administered 2024-08-10 (×3): 50 ug via INTRAVENOUS
  Administered 2024-08-10: 100 ug via INTRAVENOUS

## 2024-08-10 MED ORDER — LEVONORGESTREL 20 MCG/24HR IU IUD: 1.0000 | INTRAUTERINE_SYSTEM | Freq: Once | INTRAUTERINE | Status: DC

## 2024-08-10 MED ORDER — DEXAMETHASONE SODIUM PHOSPHATE 10 MG/ML IJ SOLN
INTRAMUSCULAR | Status: DC | PRN
  Administered 2024-08-10: 10 mg via INTRAVENOUS

## 2024-08-10 MED ORDER — ACETAMINOPHEN 10 MG/ML IV SOLN
INTRAVENOUS | Status: AC
  Filled 2024-08-10: qty 100

## 2024-08-10 MED ORDER — PROPOFOL 10 MG/ML IV BOLUS
INTRAVENOUS | Status: AC
  Filled 2024-08-10: qty 20

## 2024-08-10 MED ORDER — SUGAMMADEX SODIUM 200 MG/2ML IV SOLN
INTRAVENOUS | Status: DC | PRN
  Administered 2024-08-10: 200 mg via INTRAVENOUS

## 2024-08-10 MED ORDER — ELETRIPTAN HYDROBROMIDE 20 MG PO TABS: 20.0000 mg | ORAL_TABLET | ORAL | Status: DC | PRN

## 2024-08-10 MED ORDER — CEFAZOLIN SODIUM-DEXTROSE 2-4 GM/100ML-% IV SOLN
2.0000 g | INTRAVENOUS | Status: AC
  Administered 2024-08-10: 2 g via INTRAVENOUS
  Filled 2024-08-10: qty 100

## 2024-08-10 MED ORDER — GABAPENTIN 300 MG PO CAPS
300.0000 mg | ORAL_CAPSULE | ORAL | Status: AC
  Administered 2024-08-10: 300 mg via ORAL
  Filled 2024-08-10: qty 1

## 2024-08-10 MED ORDER — ONDANSETRON HCL 4 MG/2ML IJ SOLN
INTRAMUSCULAR | Status: DC | PRN
  Administered 2024-08-10: 4 mg via INTRAVENOUS

## 2024-08-10 MED ORDER — DIPHENHYDRAMINE HCL 50 MG/ML IJ SOLN
INTRAMUSCULAR | Status: DC | PRN
  Administered 2024-08-10: 25 mg via INTRAVENOUS

## 2024-08-10 MED ORDER — ONDANSETRON HCL 4 MG/2ML IJ SOLN
4.0000 mg | Freq: Four times a day (QID) | INTRAMUSCULAR | Status: DC | PRN
Start: 2024-08-10 — End: 2024-08-11

## 2024-08-10 MED ORDER — ONDANSETRON 4 MG PO TBDP
4.0000 mg | ORAL_TABLET | Freq: Four times a day (QID) | ORAL | Status: DC | PRN
Start: 2024-08-10 — End: 2024-08-11

## 2024-08-10 MED ORDER — PROPOFOL 10 MG/ML IV BOLUS
INTRAVENOUS | Status: DC | PRN
  Administered 2024-08-10: 10 mg via INTRAVENOUS

## 2024-08-10 MED ORDER — AZELASTINE HCL 0.1 % NA SOLN
1.0000 | Freq: Two times a day (BID) | NASAL | Status: DC
  Filled 2024-08-10: qty 30

## 2024-08-10 MED ORDER — CHLORHEXIDINE GLUCONATE 0.12 % MT SOLN: 15.0000 mL | Freq: Once | OROMUCOSAL | Status: AC

## 2024-08-10 MED ORDER — HYDROMORPHONE HCL 1 MG/ML IJ SOLN
INTRAMUSCULAR | Status: AC
  Filled 2024-08-10: qty 0.5

## 2024-08-10 MED ORDER — LACTATED RINGERS IV SOLN: INTRAVENOUS | Status: DC

## 2024-08-10 MED ORDER — DEXAMETHASONE SODIUM PHOSPHATE 10 MG/ML IJ SOLN
INTRAMUSCULAR | Status: AC
Start: 2024-08-10 — End: 2024-08-10
  Filled 2024-08-10: qty 1

## 2024-08-10 MED ORDER — ONDANSETRON HCL 4 MG/2ML IJ SOLN
INTRAMUSCULAR | Status: AC
Start: 2024-08-10 — End: 2024-08-10
  Filled 2024-08-10: qty 2

## 2024-08-10 MED ORDER — ENOXAPARIN SODIUM 40 MG/0.4ML IJ SOSY
40.0000 mg | PREFILLED_SYRINGE | INTRAMUSCULAR | Status: DC
  Filled 2024-08-10: qty 0.4

## 2024-08-10 MED ORDER — ORAL CARE MOUTH RINSE: 15.0000 mL | Freq: Once | OROMUCOSAL | Status: AC

## 2024-08-10 MED ORDER — ENSURE PRE-SURGERY PO LIQD: 296.0000 mL | Freq: Once | ORAL | Status: DC

## 2024-08-10 SURGICAL SUPPLY — 42 items
BAG COUNTER SPONGE SURGICOUNT (BAG) ×1 IMPLANT
BLADE CLIPPER SURG (BLADE) IMPLANT
CANISTER SUCTION 3000ML PPV (SUCTIONS) ×1 IMPLANT
CHLORAPREP W/TINT 26 (MISCELLANEOUS) ×1 IMPLANT
CLIP APPLIE 5 13 M/L LIGAMAX5 (MISCELLANEOUS) ×1 IMPLANT
COVER MAYO STAND STRL (DRAPES) IMPLANT
COVER SURGICAL LIGHT HANDLE (MISCELLANEOUS) ×1 IMPLANT
DERMABOND ADVANCED .7 DNX12 (GAUZE/BANDAGES/DRESSINGS) ×1 IMPLANT
DRAPE C-ARM 42X72 X-RAY (DRAPES) IMPLANT
DRAPE LAPAROSCOPIC ABDOMINAL (DRAPES) IMPLANT
ELECTRODE REM PT RTRN 9FT ADLT (ELECTROSURGICAL) ×1 IMPLANT
GAUZE 4X4 16PLY ~~LOC~~+RFID DBL (SPONGE) IMPLANT
GLOVE BIO SURGEON STRL SZ8 (GLOVE) ×1 IMPLANT
GLOVE BIOGEL PI IND STRL 8 (GLOVE) ×1 IMPLANT
GOWN STRL REUS W/ TWL LRG LVL3 (GOWN DISPOSABLE) ×2 IMPLANT
GOWN STRL REUS W/ TWL XL LVL3 (GOWN DISPOSABLE) ×1 IMPLANT
IRRIGATION SUCT STRKRFLW 2 WTP (MISCELLANEOUS) ×1 IMPLANT
KIT BASIN OR (CUSTOM PROCEDURE TRAY) ×1 IMPLANT
KIT TURNOVER KIT B (KITS) ×1 IMPLANT
LHOOK LAP DISP 36CM (ELECTROSURGICAL) ×1 IMPLANT
NDL 22X1.5 STRL (OR ONLY) (MISCELLANEOUS) ×1 IMPLANT
NEEDLE 22X1.5 STRL (OR ONLY) (MISCELLANEOUS) ×1 IMPLANT
NS IRRIG 1000ML POUR BTL (IV SOLUTION) ×1 IMPLANT
PAD ARMBOARD POSITIONER FOAM (MISCELLANEOUS) ×1 IMPLANT
PENCIL BUTTON HOLSTER BLD 10FT (ELECTRODE) ×1 IMPLANT
POUCH RETRIEVAL ECOSAC 10 (ENDOMECHANICALS) ×1 IMPLANT
SCISSORS LAP 5X35 DISP (ENDOMECHANICALS) ×1 IMPLANT
SET CHOLANGIOGRAPH 5 50 .035 (SET/KITS/TRAYS/PACK) IMPLANT
SET TUBE SMOKE EVAC HIGH FLOW (TUBING) ×1 IMPLANT
SLEEVE Z-THREAD 5X100MM (TROCAR) ×2 IMPLANT
SOLUTION ANTFG W/FOAM PAD STRL (MISCELLANEOUS) IMPLANT
SPECIMEN JAR SMALL (MISCELLANEOUS) ×1 IMPLANT
SUT VIC AB 4-0 PS2 27 (SUTURE) ×1 IMPLANT
SUT VICRYL 0 UR6 27IN ABS (SUTURE) IMPLANT
SYR 20ML LL LF (SYRINGE) IMPLANT
TOWEL GREEN STERILE (TOWEL DISPOSABLE) ×1 IMPLANT
TOWEL GREEN STERILE FF (TOWEL DISPOSABLE) ×1 IMPLANT
TRAY LAPAROSCOPIC MC (CUSTOM PROCEDURE TRAY) ×1 IMPLANT
TROCAR BALLN 12MMX100 BLUNT (TROCAR) ×1 IMPLANT
TROCAR Z-THREAD OPTICAL 5X100M (TROCAR) ×1 IMPLANT
WARMER LAPAROSCOPE (MISCELLANEOUS) ×1 IMPLANT
WATER STERILE IRR 1000ML POUR (IV SOLUTION) ×1 IMPLANT

## 2024-08-10 NOTE — Anesthesia Procedure Notes (Signed)
 Procedure Name: Intubation Date/Time: 08/10/2024 7:30 AM  Performed by: Mannie Krystal LABOR, CRNAPre-anesthesia Checklist: Patient identified, Emergency Drugs available, Suction available and Patient being monitored Patient Re-evaluated:Patient Re-evaluated prior to induction Oxygen Delivery Method: Circle system utilized Preoxygenation: Pre-oxygenation with 100% oxygen Induction Type: IV induction Ventilation: Mask ventilation without difficulty Laryngoscope Size: Miller and 2 Grade View: Grade I Tube type: Oral Tube size: 7.0 mm Number of attempts: 1 Airway Equipment and Method: Stylet and Oral airway Placement Confirmation: ETT inserted through vocal cords under direct vision, positive ETCO2 and breath sounds checked- equal and bilateral Secured at: 22 cm Tube secured with: Tape Dental Injury: Teeth and Oropharynx as per pre-operative assessment

## 2024-08-10 NOTE — Progress Notes (Signed)
 Symptomatic cholelithiasis   08/10/24 1622  TOC Brief Assessment  Insurance and Status Reviewed  Patient has primary care physician Yes  Home environment has been reviewed From home.  Prior level of function: PTA independent, no DME usage.  Prior/Current Home Services No current home services  Social Drivers of Health Review SDOH reviewed no interventions necessary  Readmission risk has been reviewed No  Transition of care needs no transition of care needs at this time    S/p laparoscopic cholecystectomy 08/10/2024 Pt without RX med concerns or transportation issues. Jon Hoit RN,BSN,CM

## 2024-08-10 NOTE — Anesthesia Postprocedure Evaluation (Signed)
 Anesthesia Post Note  Patient: Lindsey Macias  Procedure(s) Performed: LAPAROSCOPIC CHOLECYSTECTOMY     Patient location during evaluation: PACU Anesthesia Type: General Level of consciousness: awake and alert Pain management: pain level controlled Vital Signs Assessment: post-procedure vital signs reviewed and stable Respiratory status: spontaneous breathing, nonlabored ventilation, respiratory function stable and patient connected to nasal cannula oxygen Cardiovascular status: blood pressure returned to baseline and stable Postop Assessment: no apparent nausea or vomiting Anesthetic complications: no   No notable events documented.  Last Vitals:  Vitals:   08/10/24 1100 08/10/24 1124  BP: 132/78 124/79  Pulse: 92 83  Resp: 12   Temp: 36.9 C 36.7 C  SpO2: 97% 99%    Last Pain:  Vitals:   08/10/24 1124  TempSrc: Oral  PainSc:                  Lindsey Macias

## 2024-08-10 NOTE — Transfer of Care (Signed)
 Immediate Anesthesia Transfer of Care Note  Patient: Lindsey Macias  Procedure(s) Performed: LAPAROSCOPIC CHOLECYSTECTOMY  Patient Location: PACU  Anesthesia Type:General  Level of Consciousness: awake, alert , and oriented  Airway & Oxygen Therapy: Patient Spontanous Breathing and Patient connected to face mask oxygen  Post-op Assessment: Report given to RN and Post -op Vital signs reviewed and stable  Post vital signs: Reviewed and stable  Last Vitals:  Vitals Value Taken Time  BP 146/73 08/10/24 08:34  Temp 36.4 C 08/10/24 08:34  Pulse 84 08/10/24 08:38  Resp 12 08/10/24 08:38  SpO2 100 % 08/10/24 08:38  Vitals shown include unfiled device data.  Last Pain:  Vitals:   08/10/24 0707  TempSrc:   PainSc: 0-No pain         Complications: No notable events documented.

## 2024-08-10 NOTE — Plan of Care (Signed)
  Problem: Education: Goal: Knowledge of General Education information will improve Description: Including pain rating scale, medication(s)/side effects and non-pharmacologic comfort measures Outcome: Progressing   Problem: Pain Managment: Goal: General experience of comfort will improve and/or be controlled Outcome: Progressing

## 2024-08-10 NOTE — H&P (Signed)
 Lindsey Macias is an 48 y.o. female.   Chief Complaint: Symptomatic cholelithiasis HPI: Patient is scheduled for laparoscopic cholecystectomy today.  She had another attack a couple days ago and was seen in the emergency department.  Liver function tests were elevated at that time including a bilirubin of 3.  She states she has felt better since then and her urine is more light-colored.  No other changes since I saw her in the office  Past Medical History:  Diagnosis Date   Allergy    Cancer (HCC) 2003   cervical cancer 2002 and 2003. had leep procedure.   Complication of anesthesia    GERD (gastroesophageal reflux disease)    but actually had h pylori and took meds then got better.   PONV (postoperative nausea and vomiting)    Varicose veins of both lower extremities     Past Surgical History:  Procedure Laterality Date   [place] surgery Right 2010 and 2012   ENDOVENOUS ABLATION SAPHENOUS VEIN W/ LASER Right 08/02/2019   endovenous laser ablation right greater saphenous vein by Carlin Haddock MD    SHOULDER SURGERY Right 2012   TONSILLECTOMY Bilateral 2012    Family History  Problem Relation Age of Onset   Cancer Father        Brain   Diabetes Maternal Grandmother    Social History:  reports that she quit smoking about 29 years ago. Her smoking use included cigarettes. She has never used smokeless tobacco. She reports current alcohol use. She reports that she does not use drugs.  Allergies:  Allergies  Allergen Reactions   Vicodin [Hydrocodone-Acetaminophen ] Photosensitivity   Dust Mite Extract     Sneezing,Watery eyes,Runny nose   Percocet [Oxycodone -Acetaminophen ]    Sulfa Antibiotics     Upset stomach    Medications Prior to Admission  Medication Sig Dispense Refill   cetirizine (ZYRTEC) 5 MG chewable tablet Chew 5 mg by mouth at bedtime.     estradiol (CLIMARA - DOSED IN MG/24 HR) 0.05 mg/24hr patch Place 0.05 mg onto the skin 2 (two) times a week. Monday and Friday      amoxicillin -clavulanate (AUGMENTIN ) 875-125 MG tablet Take 1 tablet by mouth 2 (two) times daily. (Patient not taking: Reported on 08/09/2024) 20 tablet 0   aspirin  EC 81 MG EC tablet Take 1 tablet (81 mg total) by mouth daily.     azelastine  (OPTIVAR ) 0.05 % ophthalmic solution Place 1 drop into both eyes 2 (two) times daily. 6 mL 11   Azelastine  HCl 137 MCG/SPRAY SOLN SPRAY TWO SPRAYS IN EACH NOSTRIL TWICE DAILY 90 mL 2   eletriptan  (RELPAX ) 20 MG tablet Take 1 tablet (20 mg total) by mouth as needed for migraine or headache. May repeat in 2 hours if headache persists or recurs. 10 tablet 3   esomeprazole  (NEXIUM ) 40 MG capsule 1 tab po q day 90 capsule 0   fluconazole  (DIFLUCAN ) 150 MG tablet Take 1 tablet (150 mg total) by mouth daily. (Patient not taking: Reported on 08/09/2024) 1 tablet 1   levonorgestrel  (MIRENA ) 20 MCG/24HR IUD 1 each by Intrauterine route once.     mometasone  (NASONEX ) 50 MCG/ACT nasal spray 2 sprays twice daily 12 each 11   ondansetron  (ZOFRAN ) 4 MG tablet Take 1 tablet (4 mg total) by mouth every 6 (six) hours. (Patient not taking: Reported on 08/09/2024) 12 tablet 0   Semaglutide -Weight Management 2.4 MG/0.75ML SOAJ Inject 2.4 mg into the skin once a week. (Patient not taking: Reported on  07/05/2024) 3 mL 0   traMADol  (ULTRAM ) 50 MG tablet Take 1 tablet (50 mg total) by mouth every 6 (six) hours as needed for up to 5 days. 12 tablet 0    Results for orders placed or performed during the hospital encounter of 08/10/24 (from the past 48 hours)  Comprehensive metabolic panel     Status: Abnormal   Collection Time: 08/10/24  6:00 AM  Result Value Ref Range   Sodium 136 135 - 145 mmol/L   Potassium 3.3 (L) 3.5 - 5.1 mmol/L   Chloride 102 98 - 111 mmol/L   CO2 21 (L) 22 - 32 mmol/L   Glucose, Bld 83 70 - 99 mg/dL    Comment: Glucose reference range applies only to samples taken after fasting for at least 8 hours.   BUN 11 6 - 20 mg/dL   Creatinine, Ser 8.83 (H) 0.44 -  1.00 mg/dL   Calcium 9.5 8.9 - 89.6 mg/dL   Total Protein 6.8 6.5 - 8.1 g/dL   Albumin 3.9 3.5 - 5.0 g/dL   AST 83 (H) 15 - 41 U/L   ALT 139 (H) 0 - 44 U/L   Alkaline Phosphatase 175 (H) 38 - 126 U/L   Total Bilirubin 2.3 (H) 0.0 - 1.2 mg/dL   GFR, Estimated 58 (L) >60 mL/min    Comment: (NOTE) Calculated using the CKD-EPI Creatinine Equation (2021)    Anion gap 13 5 - 15    Comment: Performed at [facility], [address], [place], [place] [address]   No results found.  Review of Systems  Blood pressure (P) 103/73, pulse (P) 98, temperature (P) 97.6 F (36.4 C), temperature source (P) Oral, resp. rate (P) 18, height (P) 5' 3 (1.6 m), weight (P) 71.7 kg, SpO2 (P) 99%. Physical Exam HENT:     Head: Normocephalic.     Mouth/Throat:     Mouth: Mucous membranes are moist.  Cardiovascular:     Rate and Rhythm: Normal rate and regular rhythm.  Pulmonary:     Effort: Pulmonary effort is normal.     Breath sounds: Normal breath sounds. No wheezing.  Abdominal:     General: Abdomen is flat.     Palpations: Abdomen is soft.     Comments: Minimal right upper quadrant tenderness  Musculoskeletal:        General: No tenderness.  Skin:    General: Skin is warm.  Neurological:     Mental Status: She is alert and oriented to person, place, and time.  Psychiatric:        Mood and Affect: Mood normal.      Assessment/Plan Symptomatic cholelithiasis and elevated liver function tests, possible choledocholithiasis -plan laparoscopic cholecystectomy with cholangiogram.  Procedure, risks, and benefits were discussed in detail with her.  I discussed the expected postoperative course.  She is agreeable.  Dann FORBES Hummer, MD 08/10/2024, 6:49 AM

## 2024-08-10 NOTE — Op Note (Signed)
  08/10/2024  8:32 AM  PATIENT:  Lindsey Macias  48 y.o. female  PRE-OPERATIVE DIAGNOSIS: Symptomatic cholelithiasis, possible choledocholithiasis  POST-OPERATIVE DIAGNOSIS: Cholecystitis, possible choledocholithiasis  PROCEDURE:  Procedure(s): LAPAROSCOPIC CHOLECYSTECTOMY  SURGEON: Dann Hummer, MD  ASSISTANTS: Leonor Dawn, MD  ANESTHESIA:   local and general  EBL:  No intake/output data recorded.  BLOOD ADMINISTERED:none  DRAINS: none   SPECIMEN:  Excision  DISPOSITION OF SPECIMEN:  PATHOLOGY  COUNTS:  YES  DICTATION: .Dragon Dictation Findings: Cholecystitis, we were not able to get a cholangiogram  Procedure detail: Informed consent was obtained.  She received intravenous antibiotics.  She was brought to the operating room and general endotracheal anesthesia was administered by the anesthesia staff.  Her abdomen was prepped and draped in a sterile fashion.  Timeout procedure was performed.The infraumbilical region was infiltrated with local. Infraumbilical incision was made. Subcutaneous tissues were dissected down revealing the anterior fascia. This was divided sharply along the midline. Peritoneal cavity was entered under direct vision without complication. A 0 Vicryl pursestring was placed around the fascial opening. Hassan trocar was inserted into the abdomen. The abdomen was insufflated with carbon dioxide in standard fashion. Under direct vision a 5 mm epigastric and 5 mm right abdominal port x 2 were placed.  Local was used at each port site.  Laparoscopic exploration revealed an inflamed gallbladder consistent with cholecystitis.  There were omental adhesions along the dome and body of the gallbladder.  These were taken down carefully with cautery.  This revealed the infundibulum.  We began dissection laterally and progressed medially identifying the cystic duct.  Dissection continued until we had a critical view of safety.  A clip was placed on the infundibular cystic  duct junction.  A small nick was made in the cystic duct.  Cholangiogram catheter was placed.  Despite multiple attempts and reinsertions, we could not get contrast to flow through the cholangiogram catheter in order to get a cholangiogram.  Cholangiogram catheter was removed.  3 clips were placed proximally on the cystic duct and it was divided.  Further dissection revealed the cystic artery which was clipped twice proximally once distally.  The gallbladder was taken off the liver bed using Bovie cautery.  It was placed into a bag and removed from the abdomen.  It was sent to pathology.  The liver bed was irrigated and cauterized get excellent hemostasis.  Irrigation fluid was evacuated.  The liver bed was dry the clips were in good position.  Ports were removed and direct vision.  The infraumbilical fascia was closed by tying the pursestring.  All 4 wounds were irrigated and closed with 4-0 Vicryl followed by Dermabond.  All counts were correct.  She tolerated the procedure well without apparent complication and was taken recovery in stable condition.  We plan to keep her for overnight observation and repeat LFTs tomorrow since we are unable to get a cholangiogram.  PATIENT DISPOSITION:  PACU - hemodynamically stable.   Delay start of Pharmacological VTE agent (>24hrs) due to surgical blood loss or risk of bleeding:  no  Dann Hummer, MD, MPH, FACS Pager: [phone]  8/28/20258:32 AM

## 2024-08-11 ENCOUNTER — Other Ambulatory Visit (HOSPITAL_COMMUNITY): Payer: Self-pay

## 2024-08-11 ENCOUNTER — Telehealth: Payer: Self-pay | Admitting: Emergency Medicine

## 2024-08-11 ENCOUNTER — Encounter (HOSPITAL_COMMUNITY): Payer: Self-pay | Admitting: General Surgery

## 2024-08-11 DIAGNOSIS — R7989 Other specified abnormal findings of blood chemistry: Secondary | ICD-10-CM | POA: Diagnosis not present

## 2024-08-11 DIAGNOSIS — Z7982 Long term (current) use of aspirin: Secondary | ICD-10-CM | POA: Diagnosis not present

## 2024-08-11 DIAGNOSIS — I739 Peripheral vascular disease, unspecified: Secondary | ICD-10-CM | POA: Diagnosis not present

## 2024-08-11 DIAGNOSIS — Z87891 Personal history of nicotine dependence: Secondary | ICD-10-CM | POA: Diagnosis not present

## 2024-08-11 DIAGNOSIS — K8020 Calculus of gallbladder without cholecystitis without obstruction: Secondary | ICD-10-CM | POA: Diagnosis not present

## 2024-08-11 DIAGNOSIS — K8010 Calculus of gallbladder with chronic cholecystitis without obstruction: Secondary | ICD-10-CM | POA: Diagnosis not present

## 2024-08-11 DIAGNOSIS — Z8505 Personal history of malignant neoplasm of liver: Secondary | ICD-10-CM | POA: Diagnosis not present

## 2024-08-11 LAB — CBC
HCT: 37 % (ref 36.0–46.0)
Hemoglobin: 12.7 g/dL (ref 12.0–15.0)
MCH: 30.5 pg (ref 26.0–34.0)
MCHC: 34.3 g/dL (ref 30.0–36.0)
MCV: 88.9 fL (ref 80.0–100.0)
Platelets: 260 K/uL (ref 150–400)
RBC: 4.16 MIL/uL (ref 3.87–5.11)
RDW: 13 % (ref 11.5–15.5)
WBC: 8.2 K/uL (ref 4.0–10.5)
nRBC: 0 % (ref 0.0–0.2)

## 2024-08-11 LAB — SURGICAL PATHOLOGY

## 2024-08-11 LAB — COMPREHENSIVE METABOLIC PANEL WITH GFR
ALT: 105 U/L — ABNORMAL HIGH (ref 0–44)
AST: 51 U/L — ABNORMAL HIGH (ref 15–41)
Albumin: 3.4 g/dL — ABNORMAL LOW (ref 3.5–5.0)
Alkaline Phosphatase: 150 U/L — ABNORMAL HIGH (ref 38–126)
Anion gap: 10 (ref 5–15)
BUN: 9 mg/dL (ref 6–20)
CO2: 21 mmol/L — ABNORMAL LOW (ref 22–32)
Calcium: 9.2 mg/dL (ref 8.9–10.3)
Chloride: 105 mmol/L (ref 98–111)
Creatinine, Ser: 0.93 mg/dL (ref 0.44–1.00)
GFR, Estimated: >60 mL/min (ref >60)
Glucose, Bld: 139 mg/dL — ABNORMAL HIGH (ref 70–99)
Potassium: 3.8 mmol/L (ref 3.5–5.1)
Sodium: 136 mmol/L (ref 135–145)
Total Bilirubin: 1.3 mg/dL — ABNORMAL HIGH (ref 0.0–1.2)
Total Protein: 6.1 g/dL — ABNORMAL LOW (ref 6.5–8.1)

## 2024-08-11 MED ORDER — TRAMADOL HCL 50 MG PO TABS
50.0000 mg | ORAL_TABLET | Freq: Four times a day (QID) | ORAL | 0 refills | Status: DC | PRN
  Filled 2024-08-11: qty 20, 5d supply, fill #0

## 2024-08-11 NOTE — Telephone Encounter (Signed)
 Copied from CRM [id number]. Topic: General - Call Back - No Documentation >> Aug 11, 2024 10:35 AM Corean Lindsey Macias wrote: Reason for CRM: Patient states she missed a call from [facility], no documentation in chart but is requesting a return call.

## 2024-08-11 NOTE — Discharge Summary (Signed)
 Physician Discharge Summary  Patient ID: Lindsey Macias MRN: [id number] DOB/AGE: 06/01/1976 48 y.o.  Admit date: 08/10/2024 Discharge date: 08/11/2024  Admission Diagnoses: Symptomatic cholelithiasis, elevated liver function test  Discharge Diagnoses: Status post laparoscopic cholecystectomy Principal Problem:   S/P laparoscopic cholecystectomy   Discharged Condition: good  Hospital Course: Patient underwent laparoscopic cholecystectomy.  We were not able to perform a cholangiogram despite multiple attempts.  She was kept overnight for observation and recheck of liver function test.  Fortunately, her liver function tests are trending down towards normal.  She has good pain control.  Ready for discharge.  Consults: None  Significant Diagnostic Studies: labs: LFTs  Treatments: Surgery  Discharge Exam: Blood pressure 102/64, pulse 77, temperature 98 F (36.7 C), temperature source Oral, resp. rate 13, height (P) 5' 3 (1.6 m), weight (P) 71.7 kg, SpO2 100%. General appearance: alert and cooperative Resp: clear to auscultation bilaterally GI: Soft, all 4 incisions are clean dry and intact  Disposition: Discharge disposition: 01-Home or Self Care       Discharge Instructions     Call MD for:  redness, tenderness, or signs of infection (pain, swelling, redness, odor or green/yellow discharge around incision site)   Complete by: As directed    Diet - low sodium heart healthy   Complete by: As directed    Discharge instructions   Complete by: As directed    CCS ______[facility] LAPAROSCOPIC SURGERY: POST OP INSTRUCTIONS Always review your discharge instruction sheet given to you by the facility where your surgery was performed. IF YOU HAVE DISABILITY OR FAMILY LEAVE FORMS, YOU MUST BRING THEM TO THE OFFICE FOR PROCESSING.   DO NOT GIVE THEM TO YOUR DOCTOR.  A prescription for pain medication may be given to you upon discharge.  Take your pain medication  as prescribed, if needed.  If narcotic pain medicine is not needed, then you may take acetaminophen  (Tylenol ) or ibuprofen (Advil) as needed. Take your usually prescribed medications unless otherwise directed. If you need a refill on your pain medication, please contact your pharmacy.  They will contact our office to request authorization. Prescriptions will not be filled after 5pm or on week-ends. You should follow a light diet the first few days after arrival home, such as soup and crackers, etc.  Be sure to include lots of fluids daily. Most patients will experience some swelling and bruising in the area of the incisions.  Ice packs will help.  Swelling and bruising can take several days to resolve.  It is common to experience some constipation if taking pain medication after surgery.  Increasing fluid intake and taking a stool softener (such as Colace) will usually help or prevent this problem from occurring.  A mild laxative (Milk of Magnesia or Miralax) should be taken according to package instructions if there are no bowel movements after 48 hours. Unless discharge instructions indicate otherwise, you may remove your bandages 24-48 hours after surgery, and you may shower at that time.  You may have steri-strips (small skin tapes) in place directly over the incision.  These strips should be left on the skin for 7-10 days.  If your surgeon used skin glue on the incision, you may shower in 24 hours.  The glue will flake off over the next 2-3 weeks.  Any sutures or staples will be removed at the office during your follow-up visit. ACTIVITIES:  You may resume regular (light) daily activities beginning the next day-such as daily self-care, walking, climbing  stairs-gradually increasing activities as tolerated.  You may have sexual intercourse when it is comfortable.  Refrain from any heavy lifting or straining until approved by your doctor. You may drive when you are no longer taking prescription pain  medication, you can comfortably wear a seatbelt, and you can safely maneuver your car and apply brakes. RETURN TO WORK:  __________________________________________________________ [name] should see your doctor in the office for a follow-up appointment approximately 2-3 weeks after your surgery.  Make sure that you call for this appointment within a day or two after you arrive home to insure a convenient appointment time. OTHER INSTRUCTIONS: __________________________________________________________________________________________________________________________ __________________________________________________________________________________________________________________________ WHEN TO CALL YOUR DOCTOR: Fever over 101.0 Inability to urinate Continued bleeding from incision. Increased pain, redness, or drainage from the incision. Increasing abdominal pain  The clinic staff is available to answer your questions during regular business hours.  Please don't hesitate to call and ask to speak to one of the nurses for clinical concerns.  If you have a medical emergency, go to the nearest emergency room or call 911.  A surgeon from [facility] is always on call at the hospital. [address], [address], [place], [place]  [address] ? [address] [address], [place], [place]   [address] [phone] ? [phone] ? FAX [phone] Web site: [email or web address]   Increase activity slowly   Complete by: As directed    No dressing needed   Complete by: As directed       Allergies as of 08/11/2024       Reactions   Vicodin [hydrocodone-acetaminophen ] Photosensitivity   Dust Mite Extract Other (See Comments)   Sneezing Runny nose Watery eyes   Percocet [oxycodone -acetaminophen ] Other (See Comments)   Unknown reaction   Sulfa Antibiotics Other (See Comments)   Upset stomach        Medication List     STOP taking these medications    amoxicillin -clavulanate 875-125 MG  tablet Commonly known as: AUGMENTIN    aspirin  EC 81 MG tablet   ondansetron  4 MG tablet Commonly known as: ZOFRAN    semaglutide -weight management 2.4 MG/0.75ML Soaj SQ injection Commonly known as: WEGOVY        TAKE these medications    acetaminophen  500 MG tablet Commonly known as: TYLENOL  Take 500 mg by mouth every 6 (six) hours as needed for moderate pain (pain score 4-6), headache or fever.   azelastine  0.05 % ophthalmic solution Commonly known as: OPTIVAR  Place 1 drop into both eyes 2 (two) times daily.   Azelastine  HCl 137 MCG/SPRAY Soln SPRAY TWO SPRAYS IN EACH NOSTRIL TWICE DAILY   cetirizine 10 MG tablet Commonly known as: ZYRTEC Take 10 mg by mouth at bedtime.   eletriptan  20 MG tablet Commonly known as: RELPAX  Take 1 tablet (20 mg total) by mouth as needed for migraine or headache. May repeat in 2 hours if headache persists or recurs.   esomeprazole  40 MG capsule Commonly known as: NEXIUM  1 tab po q day   estradiol 0.05 MG/24HR patch Commonly known as: VIVELLE-DOT Place 1 patch onto the skin See admin instructions. Apply 1 patch to the skin twice a week on Monday and Friday.   ibuprofen 200 MG tablet Commonly known as: ADVIL Take 800 mg by mouth every 6 (six) hours as needed for fever, headache or moderate pain (pain score 4-6).   levonorgestrel  20 MCG/24HR IUD Commonly known as: MIRENA  1 each by Intrauterine route once.   LYSINE PO Take 1 tablet by mouth daily.   mometasone   50 MCG/ACT nasal spray Commonly known as: Nasonex  2 sprays twice daily   traMADol  50 MG tablet Commonly known as: ULTRAM  Take 1 tablet (50 mg total) by mouth every 6 (six) hours as needed for up to 5 days. What changed: Another medication with the same name was added. Make sure you understand how and when to take each.   traMADol  50 MG tablet Commonly known as: ULTRAM  Take 1 tablet (50 mg total) by mouth every 6 (six) hours as needed for moderate pain (pain score 4-6) or  severe pain (pain score 7-10). What changed: You were already taking a medication with the same name, and this prescription was added. Make sure you understand how and when to take each.   VITAMIN D-3 PO Take 1 capsule by mouth daily.               Discharge Care Instructions  (From admission, onward)           Start     Ordered   08/11/24 0000  No dressing needed        08/11/24 9264            Follow-up Information     S[place], [place], PA-C Follow up.   Specialties: Internal Medicine, Family Medicine Contact information: [address] [place] [address] [phone]         Sebastian Moles, MD. Go to.   Specialty: General Surgery Why: as scheduled Contact information: [address] [place] [address] [phone]                 Signed: Moles FORBES Sebastian 08/11/2024, 7:36 AM

## 2024-08-11 NOTE — Progress Notes (Signed)
 DC order noted per MD. DC RN at bedside with patient/family. Patient observed stable, ambulating around the room. Agreeable with discharge. AVS printed/reviewed. PIV removed. See LDAs for incisions/wounds. TOC meds delivered to patient. All belongings accounted for. Family on the way. Patient wheeled off the unit on discharge.

## 2024-08-11 NOTE — Telephone Encounter (Addendum)
 Documentation is in 8/25 fam med encounter.  Tried calling the patient back, no answer- left vm to call back to verify 9/24 appt with Dr. Byrum

## 2024-08-11 NOTE — Telephone Encounter (Addendum)
 Called and spoke with the patient, she was just being discharged from surgery so she was busy. Pt has been scheduled ov 9/24 with Dr. Shelah after PET has been completed.  Per secure chat with RB he would like to see pt after scan has been completed.  Pt is aware of appointment and is going to check calendar once she gets home to verify this appt works.  I am cancelling 10/9 appt.  Will await pt call back to see if 9/24 works with her schedule.

## 2024-08-11 NOTE — Progress Notes (Signed)
 Pt. C/o of pain of the neck radiating towards the right shoulder, describe as an dull ache. Refused pain medication at this time.

## 2024-08-11 NOTE — Telephone Encounter (Signed)
 Yes please fit her in sooner

## 2024-08-17 NOTE — Telephone Encounter (Signed)
 Called patient to verify appt.NFN

## 2024-08-22 NOTE — Telephone Encounter (Signed)
 Appt confirmed in 8/29 encounter.

## 2024-08-23 ENCOUNTER — Ambulatory Visit: Admitting: Vascular Surgery

## 2024-08-31 ENCOUNTER — Ambulatory Visit: Admitting: Emergency Medicine

## 2024-08-31 ENCOUNTER — Ambulatory Visit (INDEPENDENT_AMBULATORY_CARE_PROVIDER_SITE_OTHER): Admitting: Medical

## 2024-08-31 VITALS — BP 122/82 | HR 79 | Temp 98.3°F | Resp 16 | Ht 63.0 in | Wt 164.4 lb

## 2024-08-31 DIAGNOSIS — R911 Solitary pulmonary nodule: Secondary | ICD-10-CM

## 2024-08-31 DIAGNOSIS — K219 Gastro-esophageal reflux disease without esophagitis: Secondary | ICD-10-CM | POA: Diagnosis not present

## 2024-08-31 DIAGNOSIS — E663 Overweight: Secondary | ICD-10-CM | POA: Diagnosis not present

## 2024-08-31 NOTE — Patient Instructions (Addendum)
 Pulmonary nodules Pulmonary nodules identified on previous imaging studies. - Perform PET scan tomorrow. - Follow up with pulmonologist on September 24th to discuss results and potential biopsy if indicated.  Overweight Currently at a BMI of 29.12 after previous weight loss on Wegovy , which was stopped prior to surgery. Interested in continuing weight loss journey. - Consider resuming weight loss medication, such as Zepbound, after PET scan results and pulmonology follow-up.  Gastroesophageal reflux disease (GERD) Currently taking Nexium . Discussed potential side effects of long-term proton pump inhibitor use, such as bone loss. - Consider discontinuing Nexium  after PET scan. - Monitor for worsening heartburn if Nexium  is discontinued.  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 No suspicious bone lesions. - Consider MRI if back pain with radicular symptoms occurs.  Menopausal state Currently on estradiol patch for hormone replacement therapy with reported weight loss and symptom improvement. - Consider increasing estradiol patch dose after addressing other health concerns.  Follow up 4-6 weeks or sooner if needed

## 2024-08-31 NOTE — Progress Notes (Signed)
 Subjective:    Patient ID: Lindsey Macias, female    DOB: [birth year], 48 y.o.   MRN: [id number]  HPI  Lindsey Macias is a 48 year old female who presents for follow-up after gallbladder surgery and evaluation of pulmonary nodules.  She underwent gallbladder surgery 21 days ago due to a gallstone causing significant symptoms, including vomiting. She feels much better post-surgery and has relief from previous symptoms.  She is scheduled for a PET scan tomorrow due to nodules found on previous imaging studies. She has a follow-up appointment with a pulmonologist on the 24th to discuss the results.  She discontinued Wegovy  prior to her surgery due to illness and has not resumed it. She is interested in continuing her weight loss journey and has been on a heart-healthy diet, exercising regularly, and using hormone replacement therapy with an estradiol patch. Her weight has decreased significantly, and she is currently at 164 pounds with a BMI of 29.12.  She has a history of GERD and has been taking Nexium . She is considering discontinuing it to see if her symptoms have improved post-gallbladder removal.  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        Review of Systems  Constitutional:  Negative for chills, fatigue and fever.  HENT:  Negative for congestion, ear pain and facial swelling.   Respiratory:  Negative for choking, shortness of breath and wheezing.   Cardiovascular:  Negative for chest pain and palpitations.  Gastrointestinal:  Negative for abdominal pain, constipation and vomiting.  Genitourinary:  Negative for dyspareunia and enuresis.  Musculoskeletal:  Negative for back pain, joint swelling and neck pain.  Skin:  Negative for rash.  Neurological:  Negative for dizziness, speech difficulty, weakness and light-headedness.  Hematological:  Negative for adenopathy.  Psychiatric/Behavioral:  Negative for  behavioral problems, dysphoric mood and suicidal ideas. The patient is not nervous/anxious.     Past Medical History:  Diagnosis Date   Allergy    Cancer (HCC) 2003   cervical cancer 2002 and 2003. had leep procedure.   Complication of anesthesia    GERD (gastroesophageal reflux disease)    but actually had h pylori and took meds then got better.   PONV (postoperative nausea and vomiting)    Varicose veins of both lower extremities      Social History   Socioeconomic History   Marital status: Divorced    Spouse name: Not on file   Number of children: Not on file   Years of education: Not on file   Highest education level: Associate degree: academic program  Occupational History   Not on file  Tobacco Use   Smoking status: Former    Current packs/day: 0.00    Types: Cigarettes    Quit date: 11/23/1994    Years since quitting: 29.7   Smokeless tobacco: Never  Vaping Use   Vaping status: Never Used  Substance and Sexual Activity   Alcohol use: Yes    Comment: Pt states she drinks 2-3 drinks per year.   Drug use: No   Sexual activity: Not Currently    Comment: intercourse age 14, more than 5 sexual partners  Other Topics Concern   Not on file  Social History Narrative   Not on file   Social Drivers of Health   Financial Resource Strain: Low Risk  (08/30/2024)   Overall Financial Resource Strain (CARDIA)    Difficulty of Paying Living Expenses:  Not very hard  Food Insecurity: No Food Insecurity (08/30/2024)   Hunger Vital Sign    Worried About Running Out of Food in the Last Year: Never true    Ran Out of Food in the Last Year: Never true  Transportation Needs: No Transportation Needs (08/30/2024)   PRAPARE - Administrator, Civil Service (Medical): No    Lack of Transportation (Non-Medical): No  Physical Activity: Sufficiently Active (08/30/2024)   Exercise Vital Sign    Days of Exercise per Week: 7 days    Minutes of Exercise per Session: 30 min   Stress: No Stress Concern Present (08/30/2024)   [employer] of Occupational Health - Occupational Stress Questionnaire    Feeling of Stress: Only a little  Social Connections: Moderately Integrated (08/30/2024)   Social Connection and Isolation Panel    Frequency of Communication with Friends and Family: More than three times a week    Frequency of Social Gatherings with Friends and Family: Twice a week    Attends Religious Services: More than 4 times per year    Active Member of Clubs or Organizations: Yes    Attends Banker Meetings: More than 4 times per year    Marital Status: Divorced  Intimate Partner Violence: Not At Risk (08/10/2024)   Humiliation, Afraid, Rape, and Kick questionnaire    Fear of Current or Ex-Partner: No    Emotionally Abused: No    Physically Abused: No    Sexually Abused: No    Past Surgical History:  Procedure Laterality Date   Bunions surgery Right 2010 and 2012   CHOLECYSTECTOMY N/A 08/10/2024   Procedure: LAPAROSCOPIC CHOLECYSTECTOMY;  Surgeon: Sebastian Moles, MD;  Location: [facility] OR;  Service: General;  Laterality: N/A;   ENDOVENOUS ABLATION SAPHENOUS VEIN W/ LASER Right 08/02/2019   endovenous laser ablation right greater saphenous vein by Carlin Haddock MD    SHOULDER SURGERY Right 2012   TONSILLECTOMY Bilateral 2012    Family History  Problem Relation Age of Onset   Cancer Father        Brain   Diabetes Maternal Grandmother     Allergies  Allergen Reactions   Vicodin [Hydrocodone-Acetaminophen ] Photosensitivity   Dust Mite Extract Other (See Comments)    Sneezing Runny nose Watery eyes   Percocet [Oxycodone -Acetaminophen ] Other (See Comments)    Unknown reaction   Sulfa Antibiotics Other (See Comments)    Upset stomach    Current Outpatient Medications on File Prior to Visit  Medication Sig Dispense Refill   acetaminophen  (TYLENOL ) 500 MG tablet Take 500 mg by mouth every 6 (six) hours as needed for moderate pain  (pain score 4-6), headache or fever.     azelastine  (OPTIVAR ) 0.05 % ophthalmic solution Place 1 drop into both eyes 2 (two) times daily. 6 mL 11   Azelastine  HCl 137 MCG/SPRAY SOLN SPRAY TWO SPRAYS IN EACH NOSTRIL TWICE DAILY 90 mL 2   cetirizine (ZYRTEC) 10 MG tablet Take 10 mg by mouth at bedtime.     eletriptan  (RELPAX ) 20 MG tablet Take 1 tablet (20 mg total) by mouth as needed for migraine or headache. May repeat in 2 hours if headache persists or recurs. 10 tablet 3   esomeprazole  (NEXIUM ) 40 MG capsule 1 tab po q day 90 capsule 0   estradiol (VIVELLE-DOT) 0.05 MG/24HR patch Place 1 patch onto the skin See admin instructions. Apply 1 patch to the skin twice a week on Monday and Friday.  levonorgestrel  (MIRENA ) 20 MCG/24HR IUD 1 each by Intrauterine route once.     mometasone  (NASONEX ) 50 MCG/ACT nasal spray 2 sprays twice daily 12 each 11   Cholecalciferol (VITAMIN D-3 PO) Take 1 capsule by mouth daily. (Patient not taking: Reported on 08/31/2024)     LYSINE PO Take 1 tablet by mouth daily. (Patient not taking: Reported on 08/31/2024)     No current facility-administered medications on file prior to visit.    BP 122/82   Pulse 79   Temp 98.3 F (36.8 C) (Oral)   Resp 16   Ht 5' 3 (1.6 m)   Wt 164 lb 6.4 oz (74.6 kg)   LMP  (LMP Unknown)   SpO2 96%   BMI 29.12 kg/m         Objective:   Physical Exam  General Mental Status- Alert. General Appearance- Not in acute distress.   Skin General: Color- Normal Color. Moisture- Normal Moisture.  Neck  No JVD.  Chest and Lung Exam Auscultation: Breath Sounds:-CTA  Cardiovascular Auscultation:Rythm- RRR Murmurs & Other Heart Sounds:Auscultation of the heart reveals- No Murmurs.  Abdomen Inspection:-Inspeection Normal. Palpation/Percussion:Note:No mass. Palpation and Percussion of the abdomen reveal- Non Tender, Non Distended + BS, no rebound or guarding.   Neurologic Cranial Nerve exam:- CN III-XII intact(No  nystagmus), symmetric smile. Strength:- 5/5 equal and symmetric strength both upper and lower extremities.       Assessment & Plan:   Patient Instructions  Pulmonary nodules Pulmonary nodules identified on previous imaging studies. - Perform PET scan tomorrow. - Follow up with pulmonologist on September 24th to discuss results and potential biopsy if indicated.  Overweight Currently at a BMI of 29.12 after previous weight loss on Wegovy , which was stopped prior to surgery. Interested in continuing weight loss journey. - Consider resuming weight loss medication, such as Zepbound, after PET scan results and pulmonology follow-up.  Gastroesophageal reflux disease (GERD) Currently taking Nexium . Discussed potential side effects of long-term proton pump inhibitor use, such as bone loss. - Consider discontinuing Nexium  after PET scan. - Monitor for worsening heartburn if Nexium  is discontinued.  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 No suspicious bone lesions. - Consider MRI if back pain with radicular symptoms occurs.  Menopausal state Currently on estradiol patch for hormone replacement therapy with reported weight loss and symptom improvement. - Consider increasing estradiol patch dose after addressing other health concerns.    Follow up in 4-6 weeks or sooner if needed  [employer], PA-C

## 2024-09-01 ENCOUNTER — Encounter (HOSPITAL_COMMUNITY)
Admission: RE | Admit: 2024-09-01 | Discharge: 2024-09-01 | Disposition: A | Discharge status: Home / Self Care | Source: Ambulatory Visit | Attending: Hematology & Oncology | Admitting: Hematology & Oncology

## 2024-09-01 ENCOUNTER — Ambulatory Visit: Payer: Self-pay | Admitting: Hematology & Oncology

## 2024-09-01 DIAGNOSIS — R911 Solitary pulmonary nodule: Secondary | ICD-10-CM | POA: Diagnosis not present

## 2024-09-01 LAB — GLUCOSE, CAPILLARY: Glucose-Capillary: 90 mg/dL (ref 70–99)

## 2024-09-01 MED ORDER — FLUDEOXYGLUCOSE F - 18 (FDG) INJECTION
8.1000 | Freq: Once | INTRAVENOUS | Status: AC | PRN
  Administered 2024-09-01: 8.1 via INTRAVENOUS

## 2024-09-01 NOTE — Telephone Encounter (Signed)
-----   Message from Maude JONELLE Crease sent at 09/01/2024  1:00 PM EDT ----- Please call and let her know that the PET scan is absolutely normal.  This lung nodule is not hyperactive.  It is stable in size going back 7 years.  There is nothing anywhere else that looks  abnormal.  This is fantastic news.  Jeralyn ----- Message ----- From: Rebecka, Rad Results In Sent: 09/01/2024  11:01 AM EDT To: Maude JONELLE Crease, MD

## 2024-09-01 NOTE — Telephone Encounter (Signed)
 Advised via MyChart.

## 2024-09-04 ENCOUNTER — Encounter: Payer: Self-pay | Admitting: *Deleted

## 2024-09-04 NOTE — Progress Notes (Signed)
 Reviewed PET scan. Nothing suspicious for malignancy seen. She will see pulmonary on 09/06/2024 and at that time will determine if she needs to come into office for new patient appointment.   Oncology Nurse Navigator Documentation     09/04/2024    7:30 AM  Oncology Nurse Navigator Flowsheets  Navigator Follow Up Date: 09/06/2024  Navigator Follow Up Reason: Review Note  Navigator Location [facility]  Navigator Encounter Type Scan Review  Patient Visit Type MedOnc  Treatment Phase Abnormal Scans  Barriers/Navigation Needs Coordination of Care;Education  Interventions None Required  Acuity Level 2-Minimal Needs (1-2 Barriers Identified)  Time Spent with Patient 15

## 2024-09-06 ENCOUNTER — Encounter: Payer: Self-pay | Admitting: Emergency Medicine

## 2024-09-06 ENCOUNTER — Ambulatory Visit (INDEPENDENT_AMBULATORY_CARE_PROVIDER_SITE_OTHER): Admitting: Emergency Medicine

## 2024-09-06 VITALS — BP 116/70 | HR 64 | Ht 63.0 in | Wt 163.6 lb

## 2024-09-06 DIAGNOSIS — R911 Solitary pulmonary nodule: Secondary | ICD-10-CM

## 2024-09-06 NOTE — Patient Instructions (Signed)
 We reviewed your CT scans of the chest/abdomen/pelvis as well as a recent PET scan.  There is a stable nodule in the right middle lobe that has not changed in size for over 7 years.  There is no metabolic activity in the nodule.  This is good news. You do not need to have any dedicated follow-up CT unless there is a clinical change. Follow Dr. Aviv Lengacher for any new respiratory issues

## 2024-09-06 NOTE — Progress Notes (Signed)
 Subjective:    Patient ID: Lindsey Macias, female    DOB: 06-15-1976, 48 y.o.   MRN: [id number]  HPI Discussed the use of AI scribe software for clinical note transcription with the patient, who gave verbal consent to proceed.  History of Present Illness Lindsey Macias is a 48 year old female who presents for evaluation of a right middle lobe pulmonary nodule.  A right middle lobe pulmonary nodule was initially identified on a CT chest on May 18, 2017, measuring 12 millimeters. Subsequent imaging, including a CT scan on February 21, 2020, showed stability in size and appearance. The nodule measured 14 millimeters on a CT scan from July 21, 2024, and 13 millimeters on a PET scan from five days ago.  She has a history of tobacco use, describing herself as an 'off and on smoker,' smoking half a pack every couple of days. 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 She underwent a cholecystectomy on August 10, 2024, for gallstones.  Her father had lung cancer associated with black lung disease. 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. She consistently tests negative for TB.  No current chest or breathing symptoms, including shortness of breath or cough. She reports a dust mite allergy causing morning symptoms.    Results RADIOLOGY CT Chest: Right middle lobe pulmonary nodule, 12 mm (02/21/2020) CT Chest: Right middle lobe pulmonary nodule, 14 mm (07/21/2024) PET Scan: Right middle lobe pulmonary nodule, 13 mm, no metabolic activity (09/01/2024)    Review of Systems As per HPI  Past Medical History:  Diagnosis Date   Allergy    Cancer (HCC) 2003   cervical cancer 2002 and 2003. had leep  procedure.   Complication of anesthesia    GERD (gastroesophageal reflux disease)    but actually had h pylori and took meds then got better.   PONV (postoperative nausea and vomiting)    Varicose veins of both lower extremities     Family History  Problem Relation Age of Onset   Cancer Father        Brain   Diabetes Maternal Grandmother      Social History   Socioeconomic History   Marital status: Divorced    Spouse name: Not on file   Number of children: Not on file   Years of education: Not on file   Highest education level: Associate degree: academic program  Occupational History   Not on file  Tobacco Use   Smoking status: Former    Current packs/day: 0.00    Average packs/day: 0.5 packs/day for 21.0 years (10.5 ttl pk-yrs)    Types: Cigarettes    Start date: 7    Quit date: 2018    Years since quitting: 7.7   Smokeless tobacco: Never  Vaping Use   Vaping status: Never Used  Substance and Sexual Activity   Alcohol use: Yes    Comment: Pt states she drinks 2-3 drinks per year.   Drug use: No   Sexual activity: Not Currently    Comment: intercourse age 56, more than 5 sexual partners  Other Topics Concern   Not on file  Social History Narrative   Not on file   Social Drivers of Health   Financial  Resource Strain: Low Risk  (08/30/2024)   Overall Financial Resource Strain (CARDIA)    Difficulty of Paying Living Expenses: Not very hard  Food Insecurity: No Food Insecurity (08/30/2024)   Hunger Vital Sign    Worried About Running Out of Food in the Last Year: Never true    Ran Out of Food in the Last Year: Never true  Transportation Needs: No Transportation Needs (08/30/2024)   PRAPARE - Administrator, Civil Service (Medical): No    Lack of Transportation (Non-Medical): No  Physical Activity: Sufficiently Active (08/30/2024)   Exercise Vital Sign    Days of Exercise per Week: 7 days    Minutes of Exercise per Session: 30 min  Stress: No  Stress Concern Present (08/30/2024)   [employer] of Occupational Health - Occupational Stress Questionnaire    Feeling of Stress: Only a little  Social Connections: Moderately Integrated (08/30/2024)   Social Connection and Isolation Panel    Frequency of Communication with Friends and Family: More than three times a week    Frequency of Social Gatherings with Friends and Family: Twice a week    Attends Religious Services: More than 4 times per year    Active Member of [employer] or Organizations: Yes    Attends Engineer, structural: More than 4 times per year    Marital Status: Divorced  Intimate Partner Violence: Not At Risk (08/10/2024)   Humiliation, Afraid, Rape, and Kick questionnaire    Fear of Current or Ex-Partner: No    Emotionally Abused: No    Physically Abused: No    Sexually Abused: No    Allergies  Allergen Reactions   Vicodin [Hydrocodone-Acetaminophen ] Photosensitivity   Dust Mite Extract Other (See Comments)    Sneezing Runny nose Watery eyes   Percocet [Oxycodone -Acetaminophen ] Other (See Comments)    Unknown reaction   Sulfa Antibiotics Other (See Comments)    Upset stomach    Current Outpatient Medications on File Prior to Visit  Medication Sig Dispense Refill   aspirin  81 MG chewable tablet Chew by mouth daily.     azelastine  (OPTIVAR ) 0.05 % ophthalmic solution Place 1 drop into both eyes 2 (two) times daily. 6 mL 11   Azelastine  HCl 137 MCG/SPRAY SOLN SPRAY TWO SPRAYS IN EACH NOSTRIL TWICE DAILY 90 mL 2   cetirizine (ZYRTEC) 10 MG tablet Take 10 mg by mouth at bedtime.     Cholecalciferol (VITAMIN D-3 PO) Take 1 capsule by mouth daily.     eletriptan  (RELPAX ) 20 MG tablet Take 1 tablet (20 mg total) by mouth as needed for migraine or headache. May repeat in 2 hours if headache persists or recurs. 10 tablet 3   esomeprazole  (NEXIUM ) 40 MG capsule 1 tab po q day 90 capsule 0   estradiol (VIVELLE-DOT) 0.05 MG/24HR patch Place 1 patch onto the skin  See admin instructions. Apply 1 patch to the skin twice a week on Monday and Friday.     levonorgestrel  (MIRENA ) 20 MCG/24HR IUD 1 each by Intrauterine route once.     LYSINE PO Take 1 tablet by mouth daily.     mometasone  (NASONEX ) 50 MCG/ACT nasal spray 2 sprays twice daily 12 each 11   acetaminophen  (TYLENOL ) 500 MG tablet Take 500 mg by mouth every 6 (six) hours as needed for moderate pain (pain score 4-6), headache or fever. (Patient not taking: Reported on 09/06/2024)     No current facility-administered medications on file prior to visit.  Objective:    Vitals:   09/06/24 1018  BP: 116/70  Pulse: 64  SpO2: 97%  Weight: 163 lb 9.6 oz (74.2 kg)  Height: 5' 3 (1.6 m)   Physical Exam Gen: Pleasant, well-nourished, in no distress,  normal affect  ENT: No lesions,  mouth clear,  oropharynx clear, no postnasal drip  Neck: No JVD, no stridor  Lungs: No use of accessory muscles, no crackles or wheezing on normal respiration, no wheeze on forced expiration  Cardiovascular: RRR, heart sounds normal, no murmur or gallops, no peripheral edema  Musculoskeletal: No deformities, no cyanosis or clubbing  Neuro: alert, awake, non focal  Skin: Warm, no lesions or rashes       Assessment & Plan:   Assessment & Plan   Assessment and Plan Assessment & Plan Stable right middle lobe pulmonary nodule The nodule has remained stable since June 2018, with no metabolic activity on PET scan, indicating benign nature. - Discontinue dedicated follow-up for the pulmonary nodule. - Reassess if new respiratory symptoms develop.  Allergic rhinitis due to dust mites Symptoms are chronic but manageable.   No follow-ups on file.   Lamar Chris, MD, PhD 09/06/2024, 2:30 PM [facility] [phone] or if no answer before 7:00PM call [phone] For any issues after 7:00PM please call eLink [phone]

## 2024-09-07 ENCOUNTER — Encounter: Payer: Self-pay | Admitting: *Deleted

## 2024-09-07 NOTE — Progress Notes (Signed)
 Patient referred to office for suspicious pulmonary nodule. PET scan was negative for any malignant concern, and patient saw pulmonary yesterday who confirmed this and recommended no further follow up.   Called patient this morning and let her know that we will close referral and she doesn't need to follow up with this office. She appreciated the call.   Oncology Nurse Navigator Documentation     09/07/2024    8:00 AM  Oncology Nurse Navigator Flowsheets  Navigation Complete Date: 09/07/2024  Post Navigation: Continue to Follow Patient? No  Reason Not Navigating Patient: No Cancer Diagnosis  Navigator Location [facility]  Navigator Encounter Type Appt/Treatment Plan Review;Telephone  Telephone Outgoing Call  Patient Visit Type MedOnc  Treatment Phase Abnormal Scans  Barriers/Navigation Needs No Barriers At This Time  Interventions None Required  Acuity Level 1-No Barriers  Time Spent with Patient 15

## 2024-09-21 ENCOUNTER — Ambulatory Visit: Admitting: Emergency Medicine

## 2024-10-04 ENCOUNTER — Encounter: Payer: Self-pay | Admitting: Vascular Surgery

## 2024-10-04 ENCOUNTER — Ambulatory Visit: Attending: Vascular Surgery | Admitting: Vascular Surgery

## 2024-10-04 VITALS — BP 115/76 | HR 96 | Temp 97.8°F | Ht 63.0 in | Wt 172.0 lb

## 2024-10-04 DIAGNOSIS — I774 Celiac artery compression syndrome: Secondary | ICD-10-CM | POA: Diagnosis not present

## 2024-10-04 DIAGNOSIS — S35229S Unspecified injury of superior mesenteric artery, sequela: Secondary | ICD-10-CM | POA: Diagnosis not present

## 2024-10-04 NOTE — Progress Notes (Signed)
 Patient ID: Lindsey Macias, female   DOB: 09-13-1976, 48 y.o.   MRN: [id number]  Reason for Consult: Follow-up   Referred by Dorina Loving, PA-C  Subjective:     HPI:  Lindsey Macias is a 48 y.o. female has a history of SMA dissection initially treated with anticoagulation now maintained on aspirin .  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  More recently she underwent cholecystectomy and states that much of her abdominal symptoms did resolve however she continues to have strange tearing in her mid abdomen and feels hypersensitive at times.  Her weight has been up and down recently discontinuing Wegovy .  She is able to eat without limitation at this time.  Her last upper endoscopy was performed in 2011 when she lived in [place] .  She does not have any GI bleeding.  Mesenteric duplex was performed in the hospital she now follows up with CT scan.  Past Medical History:  Diagnosis Date   Allergy    Cancer (HCC) 2003   cervical cancer 2002 and 2003. had leep procedure.   Complication of anesthesia    GERD (gastroesophageal reflux disease)    but actually had h pylori and took meds then got better.   PONV (postoperative nausea and vomiting)    Varicose veins of both lower extremities    Family History  Problem Relation Age of Onset   Cancer Father        Brain   Diabetes Maternal Grandmother    Past Surgical History:  Procedure Laterality Date   Bunions surgery Right 2010 and 2012   CHOLECYSTECTOMY N/A 08/10/2024   Procedure: LAPAROSCOPIC CHOLECYSTECTOMY;  Surgeon: Sebastian Moles, MD;  Location: [facility] OR;  Service: General;  Laterality: N/A;   ENDOVENOUS ABLATION SAPHENOUS VEIN W/ LASER Right 08/02/2019   endovenous laser ablation right greater saphenous vein by Carlin Haddock MD    SHOULDER SURGERY Right 2012   TONSILLECTOMY Bilateral 2012    Short Social History:  Social History   Tobacco Use   Smoking status: Former    Current packs/day: 0.00    Average  packs/day: 0.5 packs/day for 21.0 years (10.5 ttl pk-yrs)    Types: Cigarettes    Start date: 22    Quit date: 2018    Years since quitting: 7.8   Smokeless tobacco: Never  Substance Use Topics   Alcohol use: Yes    Comment: Pt states she drinks 2-3 drinks per year.    Allergies  Allergen Reactions   Vicodin [Hydrocodone-Acetaminophen ] Photosensitivity   Dust Mite Extract Other (See Comments)    Sneezing Runny nose Watery eyes   Percocet [Oxycodone -Acetaminophen ] Other (See Comments)    Unknown reaction   Sulfa Antibiotics Other (See Comments)    Upset stomach    Current Outpatient Medications  Medication Sig Dispense Refill   acetaminophen  (TYLENOL ) 500 MG tablet Take 500 mg by mouth every 6 (six) hours as needed for moderate pain (pain score 4-6), headache or fever.     aspirin  81 MG chewable tablet Chew by mouth daily.     azelastine  (OPTIVAR ) 0.05 % ophthalmic solution Place 1 drop into both eyes 2 (two) times daily. 6 mL 11   Azelastine  HCl 137 MCG/SPRAY SOLN SPRAY TWO SPRAYS IN EACH NOSTRIL TWICE DAILY 90 mL 2   cetirizine (ZYRTEC) 10 MG tablet Take 10 mg by mouth at bedtime.     Cholecalciferol (VITAMIN D-3 PO) Take 1 capsule by mouth daily.     eletriptan  (RELPAX )  20 MG tablet Take 1 tablet (20 mg total) by mouth as needed for migraine or headache. May repeat in 2 hours if headache persists or recurs. 10 tablet 3   esomeprazole  (NEXIUM ) 40 MG capsule 1 tab po q day 90 capsule 0   estradiol (VIVELLE-DOT) 0.05 MG/24HR patch Place 1 patch onto the skin See admin instructions. Apply 1 patch to the skin twice a week on Monday and Friday.     levonorgestrel  (MIRENA ) 20 MCG/24HR IUD 1 each by Intrauterine route once.     LYSINE PO Take 1 tablet by mouth daily.     mometasone  (NASONEX ) 50 MCG/ACT nasal spray 2 sprays twice daily 12 each 11   No current facility-administered medications for this visit.    Review of Systems  Constitutional:  Constitutional negative. HENT:  HENT negative.  Eyes: Eyes negative.  Cardiovascular: Cardiovascular negative.  GI: Positive for abdominal pain.  Musculoskeletal: Musculoskeletal negative.  Skin: Skin negative.  Neurological: Neurological negative. Hematologic: Hematologic/lymphatic negative.  Psychiatric: Psychiatric negative.        Objective:  Objective   Vitals:   10/04/24 1607  BP: 115/76  Pulse: 96  Temp: 97.8 F (36.6 C)  SpO2: 98%  Weight: 172 lb (78 kg)  Height: 5' 3 (1.6 m)   Body mass index is 30.47 kg/m.  Physical Exam HENT:     Head: Normocephalic.     Nose: Nose normal.     Mouth/Throat:     Mouth: Mucous membranes are moist.  Cardiovascular:     Rate and Rhythm: Normal rate.     Pulses: Normal pulses.  Pulmonary:     Effort: Pulmonary effort is normal.  Musculoskeletal:        General: Normal range of motion.     Cervical back: Normal range of motion.     Right lower leg: No edema.     Left lower leg: No edema.  Skin:    General: Skin is warm.     Capillary Refill: Capillary refill takes less than 2 seconds.  Neurological:     General: No focal deficit present.     Mental Status: She is alert.     Data:  Duplex Findings:  +----------------------+--------+--------+------+--------+  Mesenteric           PSV cm/sEDV cm/sPlaqueComments  +----------------------+--------+--------+------+--------+  Celiac Artery Origin    231                           +----------------------+--------+--------+------+--------+  Celiac Artery Proximal  408     199          curve    +----------------------+--------+--------+------+--------+  SMA Origin              156      39                   +----------------------+--------+--------+------+--------+  SMA Proximal            149      28                   +----------------------+--------+--------+------+--------+  SMA Mid                 180      39                    +----------------------+--------+--------+------+--------+  SMA Distal  141      25                   +----------------------+--------+--------+------+--------+           Summary:  Mesenteric:  Normal Superior Mesenteric artery findings. 70 to 99% stenosis in the  celiac  artery ( The celiac artery is noted to be tortuous).    CT IMPRESSION: 1. Negative for abdominal aortic aneurysm or dissection. Stable celiac and SMA since 2021 CTA, no acute or residual dissection. 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   2. No acute or inflammatory process identified in the abdomen or pelvis.   3. Lower lumbar disc and endplate degeneration since 2021.     Assessment/Plan:     48 year old female with history of SMA dissection that is now fully resolved now with evidence of median arcuate compression on most recent CTA with elevated velocities on mesenteric duplex.  We have discussed her options which are continued watchful waiting given that she appears to be eating without limitation other than occasional abdominal symptoms.  We could also refer her to GI for further evaluation given that she has not had endoscopy in 15 years.  Alternatively we could refer to interventional radiology for evaluation and consideration of celiac plexus block versus referral to [facility] surgery for evaluation.  We discussed that she would likely require celiac plexus block prior to consideration of treatment of MALS.  Plan will be for follow-up in 1 year with repeat mesenteric duplex as patient is hopeful to control her symptoms with diet and exercise at this time and her symptoms seem to be mild and only occasionally moderate.  If her symptoms do not resolve she can be referred to GI, interventional radiology or [facility] surgery without further evaluation.     Penne Lonni Colorado MD [facility]

## 2024-10-13 ENCOUNTER — Other Ambulatory Visit (HOSPITAL_COMMUNITY)

## 2024-10-23 ENCOUNTER — Other Ambulatory Visit: Payer: Self-pay | Admitting: Medical Genetics

## 2024-10-23 DIAGNOSIS — L659 Nonscarring hair loss, unspecified: Secondary | ICD-10-CM | POA: Diagnosis not present

## 2024-10-23 DIAGNOSIS — Z006 Encounter for examination for normal comparison and control in clinical research program: Secondary | ICD-10-CM

## 2024-11-02 DIAGNOSIS — L821 Other seborrheic keratosis: Secondary | ICD-10-CM | POA: Diagnosis not present

## 2024-11-02 DIAGNOSIS — L853 Xerosis cutis: Secondary | ICD-10-CM | POA: Diagnosis not present

## 2024-11-02 DIAGNOSIS — D1801 Hemangioma of skin and subcutaneous tissue: Secondary | ICD-10-CM | POA: Diagnosis not present

## 2024-11-02 DIAGNOSIS — L814 Other melanin hyperpigmentation: Secondary | ICD-10-CM | POA: Diagnosis not present

## 2024-12-03 ENCOUNTER — Other Ambulatory Visit: Payer: Self-pay | Admitting: Medical

## 2024-12-03 LAB — GENECONNECT MOLECULAR SCREEN: Genetic Analysis Overall Interpretation: NEGATIVE

## 2024-12-08 ENCOUNTER — Encounter: Payer: Self-pay | Admitting: Medical

## 2024-12-08 ENCOUNTER — Other Ambulatory Visit: Payer: Self-pay

## 2024-12-08 MED ORDER — ESOMEPRAZOLE MAGNESIUM 40 MG PO CPDR: DELAYED_RELEASE_CAPSULE | ORAL | 0 refills | Status: AC

## 2025-01-01 ENCOUNTER — Ambulatory Visit: Admitting: Medical

## 2025-01-01 VITALS — BP 102/80 | HR 85 | Temp 97.8°F | Resp 16 | Ht 63.0 in | Wt 188.8 lb

## 2025-01-01 DIAGNOSIS — E669 Obesity, unspecified: Secondary | ICD-10-CM

## 2025-01-01 DIAGNOSIS — R5383 Other fatigue: Secondary | ICD-10-CM | POA: Diagnosis not present

## 2025-01-01 DIAGNOSIS — J309 Allergic rhinitis, unspecified: Secondary | ICD-10-CM | POA: Diagnosis not present

## 2025-01-01 DIAGNOSIS — R748 Abnormal levels of other serum enzymes: Secondary | ICD-10-CM

## 2025-01-01 MED ORDER — WEGOVY 0.25 MG/0.5ML ~~LOC~~ SOAJ: 0.2500 mg | SUBCUTANEOUS | 0 refills | Status: AC

## 2025-01-01 MED ORDER — AMOXICILLIN-POT CLAVULANATE 875-125 MG PO TABS: 1.0000 | ORAL_TABLET | Freq: Two times a day (BID) | ORAL | 0 refills | Status: AC

## 2025-01-01 MED ORDER — FLUCONAZOLE 150 MG PO TABS: ORAL_TABLET | ORAL | 0 refills | Status: AC

## 2025-01-01 NOTE — Progress Notes (Signed)
 e

## 2025-01-01 NOTE — Patient Instructions (Signed)
 Obesity Management complicated by recent gallbladder surgery and previous Wegovy  use. Discussed cautious Wegovy  reintroduction due to past lipase elevation and pancreatitis risk. Possible weight loss clinic referral if no progress by third dosage. - Consider restarting Wegovy  with gradual dose increase. - Monitor for pancreatitis symptoms: nausea, vomiting, abdominal pain. - Repeat lipase level before starting Wegovy . - Consider referral to weight loss clinic if no weight loss by third dosage.  Allergic rhinitis Managed with azelastine  and mometasone . Discussed switching to combination nasal spray due to Flonase  intolerance. Considered Ryaltris as alternative. - Review combination nasal spray options with employer's pharmacy program. - Ensure mometasone  included in new nasal spray regimen. -recent severe nasal congestion and sinus pain maxillary sinus pain  rx Augmentin  antibiotic and Diflucan  for yeast infection  Fatigue Potentially related to sinus infection and perimenopausal symptoms. Discussed thyroid dysfunction as a contributing factor. - Order TSH and T4 to evaluate thyroid function. - Continue current hormonal treatments as prescribed by gynecologist.  Elevated liver enzymes Noted post-gallbladder surgery. Expected normalization within eight months. - Order liver enzyme panel to monitor liver function.  Elevated lipase Previously noted, likely due to common bile duct stone. No current pancreatitis symptoms. Discussed monitoring if restarting Wegovy . - Order lipase level before starting Wegovy .  Follow up 1 month or sooner if needed

## 2025-01-01 NOTE — Progress Notes (Signed)
 "  Subjective:    Patient ID: Lindsey Macias, female    DOB: April 04, 1976, 49 y.o.   MRN: [id number]  HPI  Lindsey Macias is a 49 year old female who presents for medication review and follow-up after gallbladder surgery this summer.  She stopped Wegovy  in mid-August 2025 due to gallbladder issues and had cholecystectomy on August 10, 2024 after significant pain. Since stopping Wegovy  she has gained weight and would like to resume weight loss medication but is cautious because of prior elevated lipase with gallstones. I think and explained I think lipase was elevated due to common bile duct stone complications preceding her surgery.  She needs medication changes  for allergies because of new insurance coverage. She is interested in switching to a covered combination nasal spray with azelastine  and mometasone , which have controlled her allergies separately in the past. She will investigate option with her insurance and let me know what is well covered.  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 She was told there may be potential surgery on arcuate ligament and was offered referal to other surgeon but pt declined. She has discomfort with certain exercises, especially fast walking. This pain is low level and adjacent to umbilcal area but not deep. No pain presently.   She has been attempting to lose weight since wegovy  stopped. But has not been successful. Her diet is focused on chicken salad and overnight oats with limited sweets and bread.    She reports fatigue that she attributes to a sinus infection and perimenopause. She is taking estradiol , testosterone , and prescription vitamin D, and has a Mirena  IUD.  Nasal congestion for 16 days with recent sinus pain. Hx of sinus infections.    Review of Systems  See hpi       Objective:   Physical Exam  General Mental Status- Alert. General Appearance- Not in acute distress.   Skin General:  Color- Normal Color. Moisture- Normal Moisture.  Neck No JVD.  Chest and Lung Exam Auscultation: Breath Sounds:-CTA  Cardiovascular Auscultation:Rythm- RRR Murmurs & Other Heart Sounds:Auscultation of the heart reveals- No Murmurs.  Abdomen Inspection:-Inspeection Normal. Palpation/Percussion:Note:No mass. Palpation and Percussion of the abdomen reveal- Non Tender, Non Distended + BS, no rebound or guarding.   Neurologic Cranial Nerve exam:- CN III-XII intact(No nystagmus), symmetric smile. Strength:- 5/5 equal and symmetric strength both upper and lower extremities.    Heent- maxillary sinus pressure. Ears- canals clear and normal tms. Boggy turbinates.      Assessment & Plan:   Patient Instructions  Obesity Management complicated by recent gallbladder surgery and previous Wegovy  use. Discussed cautious Wegovy  reintroduction due to past lipase elevation and pancreatitis risk. Possible weight loss clinic referral if no progress by third dosage. - Consider restarting Wegovy  with gradual dose increase. - Monitor for pancreatitis symptoms: nausea, vomiting, abdominal pain. - Repeat lipase level before starting Wegovy . - Consider referral to weight loss clinic if no weight loss by third dosage.  Allergic rhinitis Managed with azelastine  and mometasone . Discussed switching to combination nasal spray due to Flonase  intolerance. Considered Ryaltris as alternative. - Review combination nasal spray options with employer's pharmacy program. - Ensure mometasone  included in new nasal spray regimen. -recent severe nasal congestion and sinus pain maxillary sinus pain  rx Augmentin  antibiotic and Diflucan  for yeast infection  Fatigue Potentially related to sinus infection and perimenopausal symptoms. Discussed thyroid dysfunction as a contributing factor. - Order TSH and T4 to evaluate thyroid  function. - Continue current hormonal treatments as prescribed by gynecologist.  Elevated  liver enzymes Noted post-gallbladder surgery. Expected normalization within eight months. - Order liver enzyme panel to monitor liver function.  Elevated lipase Previously noted, likely due to common bile duct stone. No current pancreatitis symptoms. Discussed monitoring if restarting Wegovy . - Order lipase level before starting Wegovy .  Follow up 1 month or sooner if needed   Dallas Maxwell, PA-C   I personally spent a total of 48 minutes in the care of the patient today including getting/reviewing separately obtained history, performing a medically appropriate exam/evaluation, counseling and educating, placing orders, and documenting clinical information in the EHR.  "

## 2025-01-02 ENCOUNTER — Ambulatory Visit: Payer: Self-pay | Admitting: Medical

## 2025-01-02 LAB — COMPREHENSIVE METABOLIC PANEL WITH GFR
ALT: 18 U/L (ref 3–35)
AST: 19 U/L (ref 5–37)
Albumin: 4.5 g/dL (ref 3.5–5.2)
Alkaline Phosphatase: 80 U/L (ref 39–117)
BUN: 10 mg/dL (ref 6–23)
CO2: 27 meq/L (ref 19–32)
Calcium: 9.4 mg/dL (ref 8.4–10.5)
Chloride: 104 meq/L (ref 96–112)
Creatinine, Ser: 1.03 mg/dL (ref 0.40–1.20)
GFR: 63.55 mL/min
Glucose, Bld: 81 mg/dL (ref 70–99)
Potassium: 4.2 meq/L (ref 3.5–5.1)
Sodium: 139 meq/L (ref 135–145)
Total Bilirubin: 0.5 mg/dL (ref 0.2–1.2)
Total Protein: 6.9 g/dL (ref 6.0–8.3)

## 2025-01-02 LAB — T4, FREE: Free T4: 1.39 ng/dL (ref 0.60–1.60)

## 2025-01-02 LAB — TSH: TSH: 3.1 u[IU]/mL (ref 0.35–5.50)

## 2025-01-02 LAB — LIPASE: Lipase: 44 U/L (ref 11.0–59.0)

## 2025-01-29 ENCOUNTER — Ambulatory Visit: Admitting: Medical

## 2025-02-26 ENCOUNTER — Encounter: Admitting: Medical
